# Patient Record
Sex: Female | Born: 1935 | Race: White | Hispanic: No | Marital: Married | Smoking: Never smoker
Health system: Southern US, Community
[De-identification: ages and names within clinical notes are randomized; demographics above are authoritative.]

## PROBLEM LIST (undated history)

## (undated) DIAGNOSIS — E669 Obesity, unspecified: Secondary | ICD-10-CM

## (undated) DIAGNOSIS — M810 Age-related osteoporosis without current pathological fracture: Secondary | ICD-10-CM

## (undated) DIAGNOSIS — C8290 Follicular lymphoma, unspecified, unspecified site: Secondary | ICD-10-CM

## (undated) DIAGNOSIS — I722 Aneurysm of renal artery: Secondary | ICD-10-CM

## (undated) DIAGNOSIS — Z87440 Personal history of urinary (tract) infections: Secondary | ICD-10-CM

## (undated) DIAGNOSIS — E039 Hypothyroidism, unspecified: Secondary | ICD-10-CM

## (undated) DIAGNOSIS — R600 Localized edema: Secondary | ICD-10-CM

## (undated) DIAGNOSIS — D329 Benign neoplasm of meninges, unspecified: Secondary | ICD-10-CM

## (undated) DIAGNOSIS — K219 Gastro-esophageal reflux disease without esophagitis: Secondary | ICD-10-CM

## (undated) DIAGNOSIS — I619 Nontraumatic intracerebral hemorrhage, unspecified: Secondary | ICD-10-CM

## (undated) DIAGNOSIS — I5032 Chronic diastolic (congestive) heart failure: Secondary | ICD-10-CM

## (undated) DIAGNOSIS — I10 Essential (primary) hypertension: Secondary | ICD-10-CM

## (undated) DIAGNOSIS — IMO0002 Reserved for concepts with insufficient information to code with codable children: Secondary | ICD-10-CM

## (undated) DIAGNOSIS — M797 Fibromyalgia: Secondary | ICD-10-CM

## (undated) HISTORY — DX: Obesity, unspecified: E66.9

## (undated) HISTORY — DX: Localized edema: R60.0

## (undated) HISTORY — PX: CATARACT EXTRACTION: SUR2

## (undated) HISTORY — DX: Reserved for concepts with insufficient information to code with codable children: IMO0002

## (undated) HISTORY — DX: Chronic diastolic (congestive) heart failure: I50.32

## (undated) HISTORY — DX: Personal history of urinary (tract) infections: Z87.440

## (undated) HISTORY — DX: Benign neoplasm of meninges, unspecified: D32.9

## (undated) HISTORY — DX: Gastro-esophageal reflux disease without esophagitis: K21.9

## (undated) HISTORY — DX: Aneurysm of renal artery: I72.2

## (undated) HISTORY — DX: Hypothyroidism, unspecified: E03.9

## (undated) HISTORY — DX: Age-related osteoporosis without current pathological fracture: M81.0

## (undated) HISTORY — DX: Essential (primary) hypertension: I10

## (undated) HISTORY — DX: Nontraumatic intracerebral hemorrhage, unspecified: I61.9

## (undated) HISTORY — DX: Fibromyalgia: M79.7

## (undated) HISTORY — DX: Follicular lymphoma, unspecified, unspecified site: C82.90

---
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
## 1980-05-06 HISTORY — PX: CYSTOCELE REPAIR: SHX163

## 1980-05-06 HISTORY — PX: RECTOCELE REPAIR: SHX761

## 1983-05-07 HISTORY — PX: CERVICAL FUSION: SHX112

## 1999-06-26 ENCOUNTER — Encounter: Admission: RE | Admit: 1999-06-26 | Discharge: 1999-09-24 | Payer: Self-pay | Admitting: *Deleted

## 2002-05-06 DIAGNOSIS — I619 Nontraumatic intracerebral hemorrhage, unspecified: Secondary | ICD-10-CM

## 2002-05-06 HISTORY — DX: Nontraumatic intracerebral hemorrhage, unspecified: I61.9

## 2002-10-05 HISTORY — PX: OTHER SURGICAL HISTORY: SHX169

## 2002-10-07 ENCOUNTER — Inpatient Hospital Stay (HOSPITAL_COMMUNITY): Admission: EM | Admit: 2002-10-07 | Discharge: 2002-10-12 | Payer: Self-pay | Admitting: Emergency Medicine

## 2002-10-07 ENCOUNTER — Encounter: Payer: Self-pay | Admitting: Emergency Medicine

## 2002-10-07 ENCOUNTER — Encounter: Payer: Self-pay | Admitting: Cardiology

## 2002-10-08 ENCOUNTER — Encounter: Payer: Self-pay | Admitting: Internal Medicine

## 2002-10-11 ENCOUNTER — Encounter: Payer: Self-pay | Admitting: Internal Medicine

## 2003-10-20 ENCOUNTER — Inpatient Hospital Stay (HOSPITAL_COMMUNITY): Admission: EM | Admit: 2003-10-20 | Discharge: 2003-10-21 | Payer: Self-pay | Admitting: Emergency Medicine

## 2003-10-21 ENCOUNTER — Encounter: Payer: Self-pay | Admitting: Cardiology

## 2004-02-06 ENCOUNTER — Ambulatory Visit: Payer: Self-pay | Admitting: Ophthalmology

## 2004-02-24 ENCOUNTER — Ambulatory Visit (HOSPITAL_COMMUNITY): Admission: RE | Admit: 2004-02-24 | Discharge: 2004-02-24 | Payer: Self-pay | Admitting: Vascular Surgery

## 2004-05-22 ENCOUNTER — Ambulatory Visit: Payer: Self-pay | Admitting: Internal Medicine

## 2004-06-25 ENCOUNTER — Ambulatory Visit: Payer: Self-pay | Admitting: Internal Medicine

## 2004-09-24 ENCOUNTER — Ambulatory Visit: Payer: Self-pay | Admitting: Internal Medicine

## 2004-10-23 ENCOUNTER — Ambulatory Visit: Payer: Self-pay | Admitting: Internal Medicine

## 2004-10-24 ENCOUNTER — Ambulatory Visit: Payer: Self-pay | Admitting: Otolaryngology

## 2004-10-25 ENCOUNTER — Other Ambulatory Visit: Payer: Self-pay

## 2004-10-25 ENCOUNTER — Ambulatory Visit: Payer: Self-pay | Admitting: Otolaryngology

## 2004-10-31 ENCOUNTER — Ambulatory Visit: Payer: Self-pay | Admitting: Otolaryngology

## 2004-10-31 ENCOUNTER — Encounter: Payer: Self-pay | Admitting: Internal Medicine

## 2004-11-03 DIAGNOSIS — C8299 Follicular lymphoma, unspecified, extranodal and solid organ sites: Secondary | ICD-10-CM | POA: Insufficient documentation

## 2004-11-20 ENCOUNTER — Ambulatory Visit: Payer: Self-pay | Admitting: Internal Medicine

## 2004-11-29 ENCOUNTER — Ambulatory Visit: Payer: Self-pay | Admitting: Internal Medicine

## 2004-12-04 ENCOUNTER — Ambulatory Visit: Payer: Self-pay | Admitting: Internal Medicine

## 2004-12-04 ENCOUNTER — Ambulatory Visit: Payer: Self-pay | Admitting: Oncology

## 2004-12-12 ENCOUNTER — Ambulatory Visit (HOSPITAL_COMMUNITY): Admission: RE | Admit: 2004-12-12 | Discharge: 2004-12-12 | Payer: Self-pay | Admitting: Oncology

## 2004-12-25 ENCOUNTER — Ambulatory Visit: Payer: Self-pay | Admitting: Internal Medicine

## 2005-04-08 ENCOUNTER — Ambulatory Visit: Payer: Self-pay | Admitting: Internal Medicine

## 2005-04-10 ENCOUNTER — Ambulatory Visit: Payer: Self-pay | Admitting: Oncology

## 2005-05-01 ENCOUNTER — Ambulatory Visit: Payer: Self-pay | Admitting: Internal Medicine

## 2005-06-25 ENCOUNTER — Ambulatory Visit: Payer: Self-pay | Admitting: Internal Medicine

## 2005-07-15 ENCOUNTER — Ambulatory Visit: Payer: Self-pay | Admitting: Oncology

## 2005-07-18 ENCOUNTER — Ambulatory Visit (HOSPITAL_COMMUNITY): Admission: RE | Admit: 2005-07-18 | Discharge: 2005-07-18 | Payer: Self-pay | Admitting: Oncology

## 2005-07-24 ENCOUNTER — Ambulatory Visit: Payer: Self-pay | Admitting: Internal Medicine

## 2005-09-17 ENCOUNTER — Ambulatory Visit: Payer: Self-pay | Admitting: Internal Medicine

## 2005-10-18 ENCOUNTER — Encounter: Payer: Self-pay | Admitting: Internal Medicine

## 2005-11-11 ENCOUNTER — Ambulatory Visit: Payer: Self-pay | Admitting: Oncology

## 2005-11-12 LAB — CBC WITH DIFFERENTIAL (CANCER CENTER ONLY)
BASO%: 0.6 % (ref 0.0–2.0)
HCT: 44 % (ref 34.8–46.6)
LYMPH%: 23.7 % (ref 14.0–48.0)
MCH: 28.8 pg (ref 26.0–34.0)
MCV: 89 fL (ref 81–101)
MONO%: 5.6 % (ref 0.0–13.0)
NEUT%: 67.4 % (ref 39.6–80.0)
Platelets: 281 10*3/uL (ref 145–400)
RDW: 13 % (ref 10.5–14.6)

## 2005-11-12 LAB — COMPREHENSIVE METABOLIC PANEL
AST: 17 U/L (ref 0–37)
Alkaline Phosphatase: 73 U/L (ref 39–117)
BUN: 24 mg/dL — ABNORMAL HIGH (ref 6–23)
Creatinine, Ser: 0.8 mg/dL (ref 0.40–1.20)

## 2005-11-14 ENCOUNTER — Ambulatory Visit (HOSPITAL_COMMUNITY): Admission: RE | Admit: 2005-11-14 | Discharge: 2005-11-14 | Payer: Self-pay | Admitting: Oncology

## 2005-12-24 ENCOUNTER — Ambulatory Visit: Payer: Self-pay | Admitting: Internal Medicine

## 2006-01-23 ENCOUNTER — Ambulatory Visit: Payer: Self-pay | Admitting: Internal Medicine

## 2006-02-14 ENCOUNTER — Ambulatory Visit: Payer: Self-pay | Admitting: Internal Medicine

## 2006-03-03 ENCOUNTER — Encounter: Admission: RE | Admit: 2006-03-03 | Discharge: 2006-03-03 | Payer: Self-pay | Admitting: Vascular Surgery

## 2006-03-03 ENCOUNTER — Encounter: Payer: Self-pay | Admitting: Internal Medicine

## 2006-03-24 ENCOUNTER — Ambulatory Visit: Payer: Self-pay | Admitting: Internal Medicine

## 2006-03-28 ENCOUNTER — Encounter: Admission: RE | Admit: 2006-03-28 | Discharge: 2006-03-28 | Payer: Self-pay | Admitting: Internal Medicine

## 2006-04-16 ENCOUNTER — Ambulatory Visit: Payer: Self-pay | Admitting: Internal Medicine

## 2006-05-20 ENCOUNTER — Ambulatory Visit: Payer: Self-pay | Admitting: Oncology

## 2006-05-23 LAB — CBC WITH DIFFERENTIAL (CANCER CENTER ONLY)
BASO%: 0.5 % (ref 0.0–2.0)
EOS%: 2.3 % (ref 0.0–7.0)
LYMPH#: 1.5 10*3/uL (ref 0.9–3.3)
LYMPH%: 23.5 % (ref 14.0–48.0)
MCHC: 33.6 g/dL (ref 32.0–36.0)
MCV: 88 fL (ref 81–101)
MONO#: 0.5 10*3/uL (ref 0.1–0.9)
Platelets: 225 10*3/uL (ref 145–400)
RDW: 13.2 % (ref 10.5–14.6)
WBC: 6.3 10*3/uL (ref 3.9–10.0)

## 2006-05-23 LAB — COMPREHENSIVE METABOLIC PANEL
AST: 15 U/L (ref 0–37)
Albumin: 4.3 g/dL (ref 3.5–5.2)
BUN: 27 mg/dL — ABNORMAL HIGH (ref 6–23)
Calcium: 10.3 mg/dL (ref 8.4–10.5)
Chloride: 101 mEq/L (ref 96–112)
Glucose, Bld: 104 mg/dL — ABNORMAL HIGH (ref 70–99)
Potassium: 4.3 mEq/L (ref 3.5–5.3)

## 2006-06-03 ENCOUNTER — Ambulatory Visit (HOSPITAL_COMMUNITY): Admission: RE | Admit: 2006-06-03 | Discharge: 2006-06-03 | Payer: Self-pay | Admitting: Oncology

## 2006-06-05 ENCOUNTER — Encounter: Payer: Self-pay | Admitting: Internal Medicine

## 2006-06-11 ENCOUNTER — Ambulatory Visit: Payer: Self-pay | Admitting: Internal Medicine

## 2006-06-17 ENCOUNTER — Emergency Department (HOSPITAL_COMMUNITY): Admission: EM | Admit: 2006-06-17 | Discharge: 2006-06-17 | Payer: Self-pay | Admitting: Emergency Medicine

## 2006-07-10 ENCOUNTER — Ambulatory Visit: Payer: Self-pay | Admitting: Internal Medicine

## 2006-07-21 ENCOUNTER — Ambulatory Visit: Payer: Self-pay | Admitting: Internal Medicine

## 2006-08-11 ENCOUNTER — Ambulatory Visit: Payer: Self-pay | Admitting: Internal Medicine

## 2006-08-11 LAB — CONVERTED CEMR LAB
Albumin: 3.7 g/dL (ref 3.5–5.2)
CO2: 34 meq/L — ABNORMAL HIGH (ref 19–32)
Calcium: 10.3 mg/dL (ref 8.4–10.5)
Chloride: 100 meq/L (ref 96–112)
Cholesterol: 198 mg/dL (ref 0–200)
Creatinine, Ser: 0.8 mg/dL (ref 0.4–1.2)
Creatinine,U: 64.3 mg/dL
Direct LDL: 134.3 mg/dL
GFR calc Af Amer: 89 mL/min
GFR calc non Af Amer: 73 mL/min
Glucose, Bld: 109 mg/dL — ABNORMAL HIGH (ref 70–99)
Microalb Creat Ratio: 7.8 mg/g (ref 0.0–30.0)
Microalb, Ur: 0.5 mg/dL (ref 0.0–1.9)
Potassium: 4 meq/L (ref 3.5–5.1)
Sodium: 140 meq/L (ref 135–145)
TSH: 0.95 microintl units/mL (ref 0.35–5.50)
VLDL: 43 mg/dL — ABNORMAL HIGH (ref 0–40)

## 2006-08-19 DIAGNOSIS — M5030 Other cervical disc degeneration, unspecified cervical region: Secondary | ICD-10-CM | POA: Insufficient documentation

## 2006-08-19 DIAGNOSIS — I10 Essential (primary) hypertension: Secondary | ICD-10-CM | POA: Insufficient documentation

## 2006-08-19 DIAGNOSIS — I872 Venous insufficiency (chronic) (peripheral): Secondary | ICD-10-CM | POA: Insufficient documentation

## 2006-08-19 DIAGNOSIS — IMO0001 Reserved for inherently not codable concepts without codable children: Secondary | ICD-10-CM

## 2006-08-19 DIAGNOSIS — E039 Hypothyroidism, unspecified: Secondary | ICD-10-CM

## 2006-08-19 DIAGNOSIS — M810 Age-related osteoporosis without current pathological fracture: Secondary | ICD-10-CM | POA: Insufficient documentation

## 2006-09-05 ENCOUNTER — Telehealth (INDEPENDENT_AMBULATORY_CARE_PROVIDER_SITE_OTHER): Payer: Self-pay | Admitting: *Deleted

## 2006-09-24 ENCOUNTER — Telehealth (INDEPENDENT_AMBULATORY_CARE_PROVIDER_SITE_OTHER): Payer: Self-pay | Admitting: *Deleted

## 2006-09-30 ENCOUNTER — Telehealth (INDEPENDENT_AMBULATORY_CARE_PROVIDER_SITE_OTHER): Payer: Self-pay | Admitting: *Deleted

## 2006-10-21 ENCOUNTER — Telehealth (INDEPENDENT_AMBULATORY_CARE_PROVIDER_SITE_OTHER): Payer: Self-pay | Admitting: *Deleted

## 2006-10-22 ENCOUNTER — Telehealth (INDEPENDENT_AMBULATORY_CARE_PROVIDER_SITE_OTHER): Payer: Self-pay | Admitting: *Deleted

## 2006-10-24 ENCOUNTER — Telehealth (INDEPENDENT_AMBULATORY_CARE_PROVIDER_SITE_OTHER): Payer: Self-pay | Admitting: *Deleted

## 2006-11-11 ENCOUNTER — Ambulatory Visit: Payer: Self-pay | Admitting: Internal Medicine

## 2006-11-14 ENCOUNTER — Telehealth (INDEPENDENT_AMBULATORY_CARE_PROVIDER_SITE_OTHER): Payer: Self-pay | Admitting: *Deleted

## 2006-11-24 ENCOUNTER — Ambulatory Visit: Payer: Self-pay | Admitting: Oncology

## 2006-11-24 ENCOUNTER — Encounter
Admission: RE | Admit: 2006-11-24 | Discharge: 2007-02-22 | Payer: Self-pay | Admitting: Physical Medicine & Rehabilitation

## 2006-11-25 ENCOUNTER — Ambulatory Visit: Payer: Self-pay | Admitting: Physical Medicine & Rehabilitation

## 2006-11-25 LAB — CBC WITH DIFFERENTIAL (CANCER CENTER ONLY)
BASO#: 0 10e3/uL (ref 0.0–0.2)
BASO%: 0.3 % (ref 0.0–2.0)
EOS%: 2.9 % (ref 0.0–7.0)
Eosinophils Absolute: 0.2 10e3/uL (ref 0.0–0.5)
HCT: 38.7 % (ref 34.8–46.6)
HGB: 13.3 g/dL (ref 11.6–15.9)
LYMPH#: 1.3 10e3/uL (ref 0.9–3.3)
LYMPH%: 21.4 % (ref 14.0–48.0)
MCH: 28.8 pg (ref 26.0–34.0)
MCHC: 34.3 g/dL (ref 32.0–36.0)
MCV: 84 fL (ref 81–101)
MONO#: 0.4 10e3/uL (ref 0.1–0.9)
MONO%: 7 % (ref 0.0–13.0)
NEUT#: 4.2 10e3/uL (ref 1.5–6.5)
NEUT%: 68.4 % (ref 39.6–80.0)
Platelets: 256 10e3/uL (ref 145–400)
RBC: 4.61 10e6/uL (ref 3.70–5.32)
RDW: 12.5 % (ref 10.5–14.6)
WBC: 6.1 10e3/uL (ref 3.9–10.0)

## 2006-11-25 LAB — COMPREHENSIVE METABOLIC PANEL
AST: 17 U/L (ref 0–37)
Alkaline Phosphatase: 71 U/L (ref 39–117)
BUN: 23 mg/dL (ref 6–23)
Calcium: 10.1 mg/dL (ref 8.4–10.5)
Chloride: 103 mEq/L (ref 96–112)
Creatinine, Ser: 0.98 mg/dL (ref 0.40–1.20)

## 2006-11-27 ENCOUNTER — Ambulatory Visit (HOSPITAL_COMMUNITY): Admission: RE | Admit: 2006-11-27 | Discharge: 2006-11-27 | Payer: Self-pay | Admitting: Oncology

## 2006-12-02 ENCOUNTER — Telehealth (INDEPENDENT_AMBULATORY_CARE_PROVIDER_SITE_OTHER): Payer: Self-pay | Admitting: *Deleted

## 2006-12-04 ENCOUNTER — Encounter: Payer: Self-pay | Admitting: Internal Medicine

## 2006-12-09 ENCOUNTER — Ambulatory Visit (HOSPITAL_COMMUNITY): Admission: RE | Admit: 2006-12-09 | Discharge: 2006-12-09 | Payer: Self-pay | Admitting: Oncology

## 2006-12-22 ENCOUNTER — Encounter: Payer: Self-pay | Admitting: Internal Medicine

## 2007-01-14 ENCOUNTER — Telehealth (INDEPENDENT_AMBULATORY_CARE_PROVIDER_SITE_OTHER): Payer: Self-pay | Admitting: *Deleted

## 2007-01-26 ENCOUNTER — Ambulatory Visit: Payer: Self-pay | Admitting: Internal Medicine

## 2007-01-28 ENCOUNTER — Ambulatory Visit: Payer: Self-pay

## 2007-01-30 ENCOUNTER — Encounter: Payer: Self-pay | Admitting: Internal Medicine

## 2007-03-16 ENCOUNTER — Telehealth (INDEPENDENT_AMBULATORY_CARE_PROVIDER_SITE_OTHER): Payer: Self-pay | Admitting: *Deleted

## 2007-04-06 ENCOUNTER — Telehealth (INDEPENDENT_AMBULATORY_CARE_PROVIDER_SITE_OTHER): Payer: Self-pay | Admitting: *Deleted

## 2007-04-10 ENCOUNTER — Ambulatory Visit: Payer: Self-pay | Admitting: Internal Medicine

## 2007-04-10 DIAGNOSIS — M549 Dorsalgia, unspecified: Secondary | ICD-10-CM | POA: Insufficient documentation

## 2007-04-23 ENCOUNTER — Ambulatory Visit: Payer: Self-pay | Admitting: Internal Medicine

## 2007-04-23 DIAGNOSIS — N3000 Acute cystitis without hematuria: Secondary | ICD-10-CM | POA: Insufficient documentation

## 2007-04-23 LAB — CONVERTED CEMR LAB
Glucose, Urine, Semiquant: NEGATIVE
Nitrite: NEGATIVE
Specific Gravity, Urine: <1.005
pH: 7

## 2007-04-29 ENCOUNTER — Telehealth (INDEPENDENT_AMBULATORY_CARE_PROVIDER_SITE_OTHER): Payer: Self-pay | Admitting: *Deleted

## 2007-05-04 ENCOUNTER — Telehealth: Payer: Self-pay | Admitting: Family Medicine

## 2007-06-12 ENCOUNTER — Ambulatory Visit: Payer: Self-pay | Admitting: Oncology

## 2007-06-15 LAB — CBC WITH DIFFERENTIAL (CANCER CENTER ONLY)
BASO#: 0 10*3/uL (ref 0.0–0.2)
Eosinophils Absolute: 0.2 10*3/uL (ref 0.0–0.5)
HCT: 39.7 % (ref 34.8–46.6)
HGB: 13.6 g/dL (ref 11.6–15.9)
LYMPH%: 22.6 % (ref 14.0–48.0)
MCH: 28.8 pg (ref 26.0–34.0)
MCV: 84 fL (ref 81–101)
MONO%: 5.1 % (ref 0.0–13.0)
NEUT%: 68.8 % (ref 39.6–80.0)
Platelets: 226 10*3/uL (ref 145–400)
RBC: 4.72 10*6/uL (ref 3.70–5.32)

## 2007-06-15 LAB — COMPREHENSIVE METABOLIC PANEL
ALT: 19 U/L (ref 0–35)
AST: 15 U/L (ref 0–37)
Albumin: 4.2 g/dL (ref 3.5–5.2)
Calcium: 10.5 mg/dL (ref 8.4–10.5)
Chloride: 101 mEq/L (ref 96–112)
Potassium: 4.2 mEq/L (ref 3.5–5.3)

## 2007-06-17 ENCOUNTER — Ambulatory Visit (HOSPITAL_COMMUNITY): Admission: RE | Admit: 2007-06-17 | Discharge: 2007-06-17 | Payer: Self-pay | Admitting: Oncology

## 2007-06-22 ENCOUNTER — Telehealth (INDEPENDENT_AMBULATORY_CARE_PROVIDER_SITE_OTHER): Payer: Self-pay | Admitting: *Deleted

## 2007-07-16 ENCOUNTER — Ambulatory Visit: Payer: Self-pay | Admitting: Internal Medicine

## 2007-07-17 ENCOUNTER — Encounter: Payer: Self-pay | Admitting: Internal Medicine

## 2007-07-17 LAB — CONVERTED CEMR LAB
Basophils Absolute: 0 10*3/uL (ref 0.0–0.1)
Basophils Relative: 0.3 % (ref 0.0–1.0)
Calcium: 10.3 mg/dL (ref 8.4–10.5)
Eosinophils Relative: 2.7 % (ref 0.0–5.0)
GFR calc Af Amer: 89 mL/min
MCV: 86.4 fL (ref 78.0–100.0)
Platelets: 232 10*3/uL (ref 150–400)
Sodium: 139 meq/L (ref 135–145)
TSH: 1.95 microintl units/mL (ref 0.35–5.50)

## 2007-08-12 ENCOUNTER — Telehealth (INDEPENDENT_AMBULATORY_CARE_PROVIDER_SITE_OTHER): Payer: Self-pay | Admitting: *Deleted

## 2007-08-18 ENCOUNTER — Telehealth: Payer: Self-pay | Admitting: Family Medicine

## 2007-09-21 ENCOUNTER — Telehealth (INDEPENDENT_AMBULATORY_CARE_PROVIDER_SITE_OTHER): Payer: Self-pay | Admitting: *Deleted

## 2007-09-29 ENCOUNTER — Telehealth (INDEPENDENT_AMBULATORY_CARE_PROVIDER_SITE_OTHER): Payer: Self-pay | Admitting: *Deleted

## 2007-09-30 ENCOUNTER — Telehealth (INDEPENDENT_AMBULATORY_CARE_PROVIDER_SITE_OTHER): Payer: Self-pay | Admitting: *Deleted

## 2007-10-12 ENCOUNTER — Telehealth: Payer: Self-pay | Admitting: Internal Medicine

## 2007-10-30 ENCOUNTER — Telehealth (INDEPENDENT_AMBULATORY_CARE_PROVIDER_SITE_OTHER): Payer: Self-pay | Admitting: *Deleted

## 2007-11-09 ENCOUNTER — Telehealth (INDEPENDENT_AMBULATORY_CARE_PROVIDER_SITE_OTHER): Payer: Self-pay | Admitting: *Deleted

## 2007-11-20 ENCOUNTER — Ambulatory Visit: Payer: Self-pay | Admitting: Internal Medicine

## 2007-11-23 LAB — CONVERTED CEMR LAB
BUN: 17 mg/dL (ref 6–23)
Creatinine, Ser: 0.9 mg/dL (ref 0.4–1.2)
GFR calc Af Amer: 77 mL/min
GFR calc non Af Amer: 64 mL/min
Phosphorus: 3.7 mg/dL (ref 2.3–4.6)
Potassium: 4 meq/L (ref 3.5–5.1)

## 2007-11-30 ENCOUNTER — Telehealth (INDEPENDENT_AMBULATORY_CARE_PROVIDER_SITE_OTHER): Payer: Self-pay | Admitting: *Deleted

## 2007-12-03 ENCOUNTER — Ambulatory Visit: Payer: Self-pay | Admitting: Oncology

## 2007-12-08 LAB — CBC WITH DIFFERENTIAL (CANCER CENTER ONLY)
BASO#: 0 10*3/uL (ref 0.0–0.2)
Eosinophils Absolute: 0.2 10*3/uL (ref 0.0–0.5)
HGB: 13.7 g/dL (ref 11.6–15.9)
MCH: 27.7 pg (ref 26.0–34.0)
MONO#: 0.3 10*3/uL (ref 0.1–0.9)
MONO%: 5.9 % (ref 0.0–13.0)
NEUT#: 3.6 10*3/uL (ref 1.5–6.5)
RBC: 4.94 10*6/uL (ref 3.70–5.32)

## 2007-12-08 LAB — COMPREHENSIVE METABOLIC PANEL
Albumin: 4.1 g/dL (ref 3.5–5.2)
Alkaline Phosphatase: 70 U/L (ref 39–117)
CO2: 28 mEq/L (ref 19–32)
Calcium: 10.3 mg/dL (ref 8.4–10.5)
Creatinine, Ser: 1.02 mg/dL (ref 0.40–1.20)
Sodium: 140 mEq/L (ref 135–145)

## 2007-12-10 ENCOUNTER — Encounter (INDEPENDENT_AMBULATORY_CARE_PROVIDER_SITE_OTHER): Payer: Self-pay | Admitting: *Deleted

## 2007-12-10 ENCOUNTER — Ambulatory Visit (HOSPITAL_COMMUNITY): Admission: RE | Admit: 2007-12-10 | Discharge: 2007-12-10 | Payer: Self-pay | Admitting: Oncology

## 2007-12-14 ENCOUNTER — Telehealth (INDEPENDENT_AMBULATORY_CARE_PROVIDER_SITE_OTHER): Payer: Self-pay | Admitting: *Deleted

## 2007-12-14 ENCOUNTER — Encounter: Payer: Self-pay | Admitting: Internal Medicine

## 2007-12-28 ENCOUNTER — Telehealth (INDEPENDENT_AMBULATORY_CARE_PROVIDER_SITE_OTHER): Payer: Self-pay | Admitting: *Deleted

## 2008-01-25 ENCOUNTER — Telehealth: Payer: Self-pay | Admitting: Internal Medicine

## 2008-02-11 ENCOUNTER — Telehealth: Payer: Self-pay | Admitting: Internal Medicine

## 2008-02-18 ENCOUNTER — Ambulatory Visit: Payer: Self-pay | Admitting: Family Medicine

## 2008-02-22 ENCOUNTER — Ambulatory Visit: Payer: Self-pay | Admitting: Internal Medicine

## 2008-02-27 ENCOUNTER — Telehealth (INDEPENDENT_AMBULATORY_CARE_PROVIDER_SITE_OTHER): Payer: Self-pay | Admitting: *Deleted

## 2008-02-29 ENCOUNTER — Telehealth: Payer: Self-pay | Admitting: Internal Medicine

## 2008-03-14 ENCOUNTER — Telehealth (INDEPENDENT_AMBULATORY_CARE_PROVIDER_SITE_OTHER): Payer: Self-pay | Admitting: *Deleted

## 2008-03-21 ENCOUNTER — Telehealth (INDEPENDENT_AMBULATORY_CARE_PROVIDER_SITE_OTHER): Payer: Self-pay | Admitting: *Deleted

## 2008-03-28 ENCOUNTER — Telehealth: Payer: Self-pay | Admitting: Internal Medicine

## 2008-04-20 ENCOUNTER — Telehealth: Payer: Self-pay | Admitting: Internal Medicine

## 2008-05-10 ENCOUNTER — Telehealth: Payer: Self-pay | Admitting: Family Medicine

## 2008-05-16 ENCOUNTER — Telehealth: Payer: Self-pay | Admitting: Internal Medicine

## 2008-05-23 ENCOUNTER — Telehealth: Payer: Self-pay | Admitting: Internal Medicine

## 2008-05-30 ENCOUNTER — Telehealth: Payer: Self-pay | Admitting: Internal Medicine

## 2008-05-31 ENCOUNTER — Ambulatory Visit: Payer: Self-pay | Admitting: Internal Medicine

## 2008-06-01 LAB — CONVERTED CEMR LAB
ALT: 18 units/L (ref 0–35)
AST: 16 units/L (ref 0–37)
Basophils Absolute: 0.1 10*3/uL (ref 0.0–0.1)
Basophils Relative: 1.4 % (ref 0.0–3.0)
Creatinine, Ser: 0.8 mg/dL (ref 0.4–1.2)
Eosinophils Absolute: 0.2 10*3/uL (ref 0.0–0.7)
Eosinophils Relative: 2.8 % (ref 0.0–5.0)
Free T4: 0.8 ng/dL (ref 0.6–1.6)
GFR calc non Af Amer: 73 mL/min
HCT: 40.3 % (ref 36.0–46.0)
Hemoglobin: 13.5 g/dL (ref 12.0–15.0)
Lymphocytes Relative: 19.9 % (ref 12.0–46.0)
Monocytes Relative: 7.2 % (ref 3.0–12.0)
Phosphorus: 3.6 mg/dL (ref 2.3–4.6)
Platelets: 230 10*3/uL (ref 150–400)
Potassium: 3.6 meq/L (ref 3.5–5.1)
RDW: 13.2 % (ref 11.5–14.6)
Sodium: 140 meq/L (ref 135–145)
Total Bilirubin: 0.6 mg/dL (ref 0.3–1.2)
Total Protein: 7.6 g/dL (ref 6.0–8.3)

## 2008-06-22 ENCOUNTER — Telehealth: Payer: Self-pay | Admitting: Internal Medicine

## 2008-07-05 ENCOUNTER — Ambulatory Visit: Payer: Self-pay | Admitting: Internal Medicine

## 2008-07-11 ENCOUNTER — Telehealth: Payer: Self-pay | Admitting: Internal Medicine

## 2008-07-19 ENCOUNTER — Telehealth: Payer: Self-pay | Admitting: Internal Medicine

## 2008-07-26 ENCOUNTER — Ambulatory Visit: Payer: Self-pay | Admitting: Internal Medicine

## 2008-07-26 DIAGNOSIS — I8000 Phlebitis and thrombophlebitis of superficial vessels of unspecified lower extremity: Secondary | ICD-10-CM | POA: Insufficient documentation

## 2008-08-17 ENCOUNTER — Ambulatory Visit: Payer: Self-pay | Admitting: Family Medicine

## 2008-08-22 ENCOUNTER — Telehealth: Payer: Self-pay | Admitting: Internal Medicine

## 2008-08-29 ENCOUNTER — Telehealth: Payer: Self-pay | Admitting: Internal Medicine

## 2008-09-02 ENCOUNTER — Ambulatory Visit: Payer: Self-pay | Admitting: Family Medicine

## 2008-09-02 LAB — CONVERTED CEMR LAB
Bilirubin Urine: NEGATIVE
Blood in Urine, dipstick: NEGATIVE
Nitrite: NEGATIVE
Urobilinogen, UA: 0.2
pH: 6.5

## 2008-09-05 ENCOUNTER — Telehealth: Payer: Self-pay | Admitting: Family Medicine

## 2008-09-05 LAB — CONVERTED CEMR LAB
AST: 23 units/L (ref 0–37)
Alkaline Phosphatase: 64 units/L (ref 39–117)
Basophils Absolute: 0 10*3/uL (ref 0.0–0.1)
Basophils Relative: 0.7 % (ref 0.0–3.0)
Bilirubin, Direct: 0 mg/dL (ref 0.0–0.3)
CO2: 33 meq/L — ABNORMAL HIGH (ref 19–32)
Creatinine, Ser: 0.7 mg/dL (ref 0.4–1.2)
HCT: 41 % (ref 36.0–46.0)
Hemoglobin: 14.1 g/dL (ref 12.0–15.0)
MCHC: 34.4 g/dL (ref 30.0–36.0)
Monocytes Absolute: 0.6 10*3/uL (ref 0.1–1.0)
Neutrophils Relative %: 59.9 % (ref 43.0–77.0)
Potassium: 3.5 meq/L (ref 3.5–5.1)
RBC: 4.6 M/uL (ref 3.87–5.11)
RDW: 13.6 % (ref 11.5–14.6)
TSH: 1.79 microintl units/mL (ref 0.35–5.50)
Total Protein: 8 g/dL (ref 6.0–8.3)

## 2008-09-08 ENCOUNTER — Telehealth: Payer: Self-pay | Admitting: Internal Medicine

## 2008-09-19 ENCOUNTER — Ambulatory Visit: Payer: Self-pay | Admitting: Internal Medicine

## 2008-09-20 LAB — CONVERTED CEMR LAB
AST: 23 units/L (ref 0–37)
Basophils Absolute: 0 10*3/uL (ref 0.0–0.1)
Basophils Relative: 0.5 % (ref 0.0–3.0)
Calcium: 10 mg/dL (ref 8.4–10.5)
Creatinine, Ser: 0.7 mg/dL (ref 0.4–1.2)
Eosinophils Relative: 1.5 % (ref 0.0–5.0)
Free T4: 0.6 ng/dL (ref 0.6–1.6)
HCT: 38.2 % (ref 36.0–46.0)
Hgb A1c MFr Bld: 6.2 % (ref 4.6–6.5)
Lymphs Abs: 1.4 10*3/uL (ref 0.7–4.0)
MCHC: 34.7 g/dL (ref 30.0–36.0)
Monocytes Absolute: 0.5 10*3/uL (ref 0.1–1.0)
Neutro Abs: 4.2 10*3/uL (ref 1.4–7.7)
Phosphorus: 3.2 mg/dL (ref 2.3–4.6)
Platelets: 218 10*3/uL (ref 150.0–400.0)
TSH: 1.71 microintl units/mL (ref 0.35–5.50)
Total Bilirubin: 0.7 mg/dL (ref 0.3–1.2)
Total Protein: 6.9 g/dL (ref 6.0–8.3)

## 2008-09-23 ENCOUNTER — Encounter: Payer: Self-pay | Admitting: Internal Medicine

## 2008-09-30 ENCOUNTER — Encounter: Payer: Self-pay | Admitting: Internal Medicine

## 2008-10-04 ENCOUNTER — Telehealth: Payer: Self-pay | Admitting: Internal Medicine

## 2008-10-07 ENCOUNTER — Telehealth: Payer: Self-pay | Admitting: Internal Medicine

## 2008-10-21 ENCOUNTER — Telehealth: Payer: Self-pay | Admitting: Internal Medicine

## 2008-10-27 ENCOUNTER — Telehealth: Payer: Self-pay | Admitting: Internal Medicine

## 2008-11-09 ENCOUNTER — Telehealth: Payer: Self-pay | Admitting: Internal Medicine

## 2008-11-16 ENCOUNTER — Encounter: Payer: Self-pay | Admitting: Internal Medicine

## 2008-11-18 ENCOUNTER — Telehealth: Payer: Self-pay | Admitting: Internal Medicine

## 2008-11-30 ENCOUNTER — Ambulatory Visit: Payer: Self-pay | Admitting: Internal Medicine

## 2008-12-01 ENCOUNTER — Telehealth: Payer: Self-pay | Admitting: Internal Medicine

## 2008-12-12 ENCOUNTER — Telehealth: Payer: Self-pay | Admitting: Internal Medicine

## 2008-12-15 ENCOUNTER — Telehealth: Payer: Self-pay | Admitting: Internal Medicine

## 2008-12-20 ENCOUNTER — Ambulatory Visit: Payer: Self-pay | Admitting: Family Medicine

## 2008-12-22 ENCOUNTER — Ambulatory Visit: Payer: Self-pay | Admitting: Gastroenterology

## 2008-12-26 ENCOUNTER — Telehealth: Payer: Self-pay | Admitting: Internal Medicine

## 2009-01-10 ENCOUNTER — Encounter: Payer: Self-pay | Admitting: Gastroenterology

## 2009-01-17 ENCOUNTER — Encounter: Payer: Self-pay | Admitting: Gastroenterology

## 2009-01-17 ENCOUNTER — Ambulatory Visit: Payer: Self-pay | Admitting: Gastroenterology

## 2009-01-17 DIAGNOSIS — K210 Gastro-esophageal reflux disease with esophagitis: Secondary | ICD-10-CM

## 2009-01-19 ENCOUNTER — Encounter: Payer: Self-pay | Admitting: Gastroenterology

## 2009-01-20 ENCOUNTER — Ambulatory Visit: Payer: Self-pay | Admitting: Internal Medicine

## 2009-01-24 ENCOUNTER — Ambulatory Visit: Payer: Self-pay | Admitting: Family Medicine

## 2009-01-24 LAB — CONVERTED CEMR LAB
Ketones, urine, test strip: NEGATIVE
Nitrite: NEGATIVE

## 2009-02-01 ENCOUNTER — Telehealth: Payer: Self-pay | Admitting: Internal Medicine

## 2009-02-06 ENCOUNTER — Telehealth: Payer: Self-pay | Admitting: Internal Medicine

## 2009-02-22 ENCOUNTER — Ambulatory Visit: Payer: Self-pay | Admitting: Gastroenterology

## 2009-02-25 ENCOUNTER — Telehealth: Payer: Self-pay | Admitting: Internal Medicine

## 2009-02-27 ENCOUNTER — Ambulatory Visit: Payer: Self-pay | Admitting: Family Medicine

## 2009-02-27 ENCOUNTER — Telehealth: Payer: Self-pay | Admitting: Gastroenterology

## 2009-02-27 DIAGNOSIS — R32 Unspecified urinary incontinence: Secondary | ICD-10-CM | POA: Insufficient documentation

## 2009-02-27 LAB — CONVERTED CEMR LAB
Blood in Urine, dipstick: NEGATIVE
Protein, U semiquant: NEGATIVE
WBC Urine, dipstick: NEGATIVE
pH: 6

## 2009-03-06 ENCOUNTER — Telehealth: Payer: Self-pay | Admitting: Internal Medicine

## 2009-03-07 ENCOUNTER — Telehealth: Payer: Self-pay | Admitting: Internal Medicine

## 2009-03-08 ENCOUNTER — Ambulatory Visit: Payer: Self-pay | Admitting: Family Medicine

## 2009-03-15 ENCOUNTER — Encounter: Payer: Self-pay | Admitting: Family Medicine

## 2009-03-15 LAB — CONVERTED CEMR LAB
Eosinophils Relative: 1 % (ref 0–5)
HCT: 34 % — ABNORMAL LOW (ref 36.0–46.0)
Hepatitis B Surface Ag: NEGATIVE
Hgb A2 Quant: 2.2 % (ref 2.2–3.2)
Hgb A: 97.8 % (ref 96.8–97.8)
Hgb F Quant: 0 % (ref 0.0–2.0)
Lymphs Abs: 1.1 10*3/uL (ref 0.7–4.0)
Monocytes Relative: 8 % (ref 3–12)
Neutro Abs: 7.1 10*3/uL (ref 1.7–7.7)
Neutrophils Relative %: 79 % — ABNORMAL HIGH (ref 43–77)
Platelets: 336 10*3/uL (ref 150–400)
RBC: 4.08 M/uL (ref 3.87–5.11)

## 2009-03-17 ENCOUNTER — Encounter: Payer: Self-pay | Admitting: Family Medicine

## 2009-03-21 ENCOUNTER — Ambulatory Visit: Payer: Self-pay | Admitting: Family Medicine

## 2009-03-22 ENCOUNTER — Encounter: Payer: Self-pay | Admitting: Family Medicine

## 2009-03-29 ENCOUNTER — Encounter: Payer: Self-pay | Admitting: Internal Medicine

## 2009-04-05 ENCOUNTER — Ambulatory Visit: Payer: Self-pay | Admitting: Urology

## 2009-04-10 ENCOUNTER — Telehealth: Payer: Self-pay | Admitting: Internal Medicine

## 2009-04-24 ENCOUNTER — Telehealth: Payer: Self-pay | Admitting: Internal Medicine

## 2009-05-16 ENCOUNTER — Telehealth: Payer: Self-pay | Admitting: Internal Medicine

## 2009-05-24 ENCOUNTER — Ambulatory Visit: Payer: Self-pay | Admitting: Internal Medicine

## 2009-05-25 ENCOUNTER — Telehealth: Payer: Self-pay | Admitting: Internal Medicine

## 2009-05-26 LAB — CONVERTED CEMR LAB
ALT: 17 units/L (ref 0–35)
AST: 18 units/L (ref 0–37)
Alkaline Phosphatase: 60 units/L (ref 39–117)
Bilirubin, Direct: 0 mg/dL (ref 0.0–0.3)
CO2: 33 meq/L — ABNORMAL HIGH (ref 19–32)
Calcium: 10.3 mg/dL (ref 8.4–10.5)
Creatinine, Ser: 0.7 mg/dL (ref 0.4–1.2)
Glucose, Bld: 119 mg/dL — ABNORMAL HIGH (ref 70–99)
Phosphorus: 3 mg/dL (ref 2.3–4.6)

## 2009-06-02 ENCOUNTER — Telehealth: Payer: Self-pay | Admitting: Internal Medicine

## 2009-06-07 ENCOUNTER — Telehealth: Payer: Self-pay | Admitting: Internal Medicine

## 2009-06-14 ENCOUNTER — Encounter: Payer: Self-pay | Admitting: Internal Medicine

## 2009-06-26 ENCOUNTER — Telehealth: Payer: Self-pay | Admitting: Internal Medicine

## 2009-07-19 ENCOUNTER — Telehealth: Payer: Self-pay | Admitting: Internal Medicine

## 2009-07-31 ENCOUNTER — Telehealth: Payer: Self-pay | Admitting: Internal Medicine

## 2009-08-22 ENCOUNTER — Telehealth: Payer: Self-pay | Admitting: Internal Medicine

## 2009-09-04 ENCOUNTER — Telehealth: Payer: Self-pay | Admitting: Internal Medicine

## 2009-09-19 ENCOUNTER — Ambulatory Visit: Payer: Self-pay | Admitting: Internal Medicine

## 2009-09-25 ENCOUNTER — Telehealth: Payer: Self-pay | Admitting: Internal Medicine

## 2009-09-25 ENCOUNTER — Encounter: Payer: Self-pay | Admitting: Internal Medicine

## 2009-10-09 ENCOUNTER — Telehealth: Payer: Self-pay | Admitting: Internal Medicine

## 2009-10-26 ENCOUNTER — Telehealth: Payer: Self-pay | Admitting: Internal Medicine

## 2009-10-27 ENCOUNTER — Ambulatory Visit: Payer: Self-pay | Admitting: Internal Medicine

## 2009-10-27 LAB — CONVERTED CEMR LAB
Bilirubin Urine: NEGATIVE
Glucose, Urine, Semiquant: NEGATIVE

## 2009-11-09 ENCOUNTER — Telehealth: Payer: Self-pay | Admitting: Internal Medicine

## 2009-11-22 ENCOUNTER — Telehealth: Payer: Self-pay | Admitting: Internal Medicine

## 2009-12-12 ENCOUNTER — Telehealth: Payer: Self-pay | Admitting: Internal Medicine

## 2009-12-15 ENCOUNTER — Telehealth: Payer: Self-pay | Admitting: Internal Medicine

## 2009-12-20 ENCOUNTER — Telehealth: Payer: Self-pay | Admitting: Internal Medicine

## 2010-01-01 ENCOUNTER — Ambulatory Visit: Payer: Self-pay | Admitting: Cardiology

## 2010-01-01 ENCOUNTER — Encounter: Payer: Self-pay | Admitting: Cardiovascular Disease

## 2010-01-09 ENCOUNTER — Encounter: Payer: Self-pay | Admitting: Cardiology

## 2010-01-09 ENCOUNTER — Ambulatory Visit: Payer: Self-pay

## 2010-01-11 ENCOUNTER — Ambulatory Visit: Payer: Self-pay | Admitting: Internal Medicine

## 2010-01-15 ENCOUNTER — Ambulatory Visit: Payer: Self-pay | Admitting: Cardiology

## 2010-01-15 LAB — CONVERTED CEMR LAB
ALT: 15 units/L (ref 0–35)
AST: 18 units/L (ref 0–37)
Basophils Absolute: 0 10*3/uL (ref 0.0–0.1)
Chloride: 104 meq/L (ref 96–112)
Creatinine, Ser: 0.8 mg/dL (ref 0.4–1.2)
Eosinophils Absolute: 0.1 10*3/uL (ref 0.0–0.7)
Eosinophils Relative: 1.9 % (ref 0.0–5.0)
Free T4: 0.76 ng/dL (ref 0.60–1.60)
Glucose, Bld: 95 mg/dL (ref 70–99)
HCT: 39.5 % (ref 36.0–46.0)
Hemoglobin: 13.2 g/dL (ref 12.0–15.0)
Monocytes Absolute: 0.5 10*3/uL (ref 0.1–1.0)
Neutro Abs: 4.2 10*3/uL (ref 1.4–7.7)
RDW: 14 % (ref 11.5–14.6)

## 2010-01-22 ENCOUNTER — Telehealth: Payer: Self-pay | Admitting: Internal Medicine

## 2010-01-23 LAB — CONVERTED CEMR LAB
ALT: 12 units/L (ref 0–35)
Albumin: 4.1 g/dL (ref 3.5–5.2)
Alkaline Phosphatase: 57 units/L (ref 39–117)
CO2: 28 meq/L (ref 19–32)
Cholesterol: 210 mg/dL — ABNORMAL HIGH (ref 0–200)
Creatinine, Ser: 0.77 mg/dL (ref 0.40–1.20)
HDL: 53 mg/dL (ref >39)
Indirect Bilirubin: 0.4 mg/dL (ref 0.0–0.9)
LDL Cholesterol: 133 mg/dL — ABNORMAL HIGH (ref 0–99)
Sodium: 143 meq/L (ref 135–145)
Total Bilirubin: 0.5 mg/dL (ref 0.3–1.2)
Total CHOL/HDL Ratio: 4
Triglycerides: 121 mg/dL (ref <150)
VLDL: 24 mg/dL (ref 0–40)

## 2010-01-29 ENCOUNTER — Ambulatory Visit: Payer: Self-pay | Admitting: Cardiology

## 2010-02-12 ENCOUNTER — Ambulatory Visit: Payer: Self-pay | Admitting: Cardiology

## 2010-02-12 ENCOUNTER — Telehealth: Payer: Self-pay | Admitting: Cardiology

## 2010-02-14 LAB — CONVERTED CEMR LAB
BUN: 20 mg/dL (ref 6–23)
Calcium: 10.9 mg/dL — ABNORMAL HIGH (ref 8.4–10.5)
Glucose, Bld: 111 mg/dL — ABNORMAL HIGH (ref 70–99)
Potassium: 4.1 meq/L (ref 3.5–5.3)

## 2010-02-20 ENCOUNTER — Telehealth: Payer: Self-pay | Admitting: Internal Medicine

## 2010-02-26 ENCOUNTER — Ambulatory Visit: Payer: Self-pay | Admitting: Cardiology

## 2010-03-12 ENCOUNTER — Telehealth: Payer: Self-pay | Admitting: Cardiology

## 2010-03-19 ENCOUNTER — Ambulatory Visit: Payer: Self-pay | Admitting: Internal Medicine

## 2010-03-19 LAB — CONVERTED CEMR LAB
Glucose, Urine, Semiquant: NEGATIVE
Ketones, urine, test strip: NEGATIVE
Protein, U semiquant: NEGATIVE
Specific Gravity, Urine: <1.005

## 2010-03-22 ENCOUNTER — Telehealth: Payer: Self-pay | Admitting: Cardiology

## 2010-03-26 ENCOUNTER — Telehealth: Payer: Self-pay | Admitting: Internal Medicine

## 2010-04-16 ENCOUNTER — Telehealth: Payer: Self-pay | Admitting: Cardiology

## 2010-04-16 ENCOUNTER — Telehealth: Payer: Self-pay | Admitting: Internal Medicine

## 2010-04-16 ENCOUNTER — Ambulatory Visit: Payer: Self-pay | Admitting: Internal Medicine

## 2010-04-16 LAB — CONVERTED CEMR LAB
Bilirubin Urine: NEGATIVE
Glucose, Urine, Semiquant: NEGATIVE
Specific Gravity, Urine: 1.015
pH: 6

## 2010-04-20 ENCOUNTER — Telehealth: Payer: Self-pay | Admitting: Family Medicine

## 2010-05-01 ENCOUNTER — Telehealth: Payer: Self-pay | Admitting: Internal Medicine

## 2010-05-10 ENCOUNTER — Telehealth: Payer: Self-pay | Admitting: Internal Medicine

## 2010-05-14 ENCOUNTER — Ambulatory Visit
Admission: RE | Admit: 2010-05-14 | Discharge: 2010-05-14 | Payer: Self-pay | Source: Home / Self Care | Attending: Internal Medicine | Admitting: Internal Medicine

## 2010-05-14 DIAGNOSIS — I5032 Chronic diastolic (congestive) heart failure: Secondary | ICD-10-CM | POA: Insufficient documentation

## 2010-05-14 LAB — CONVERTED CEMR LAB
Glucose, Urine, Semiquant: NEGATIVE
Nitrite: NEGATIVE
Protein, U semiquant: NEGATIVE
Specific Gravity, Urine: 1.01

## 2010-05-14 IMAGING — CT CT NECK W/ CM
3 of 10 series · 10 of 33 positions shown, 11 images · IV contrast (agent unspecified)
Comparison: 06/17/2007
COMPARISON: 06/17/2007

CT NECK

CLINICAL DATA: Follicular non-Hodgkins lymphoma.  Sinusitis.

CT LIMITED SINUSES WITHOUT CONTRAST
TECHNIQUE: Multidetector CT images of the paranasal sinuses were
obtained in a single plane without contrast.
TECHNIQUE: Multidetector CT imaging of the neck, chest, abdomen and
pelvis was performed using the standard protocol following the
bolus administration of intravenous contrast.
Contrast: 125 ml Vmnipaque-2DD

[Series 7: cap 5.0 b40f · axial · 0.87mm/px · z∈[-654,-364]mm · 3 of 118 slices shown, 4 images]
[im 30/118  soft-tissue]
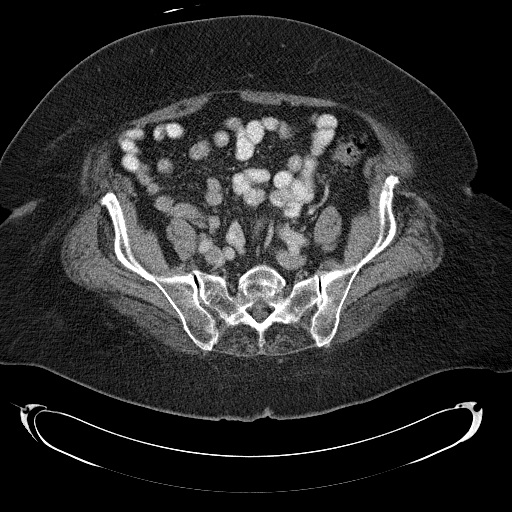
[im 30/118  bone]
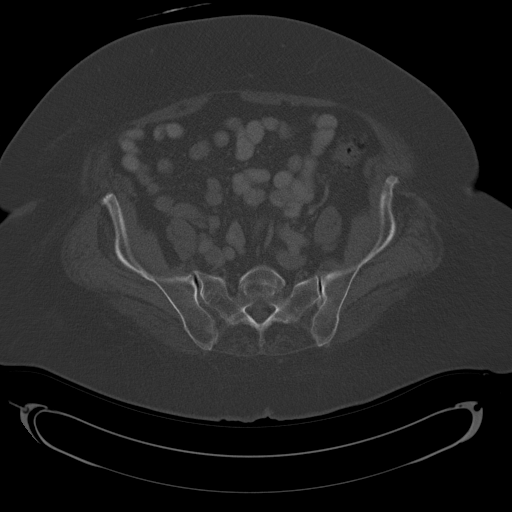
[im 59/118  bone]
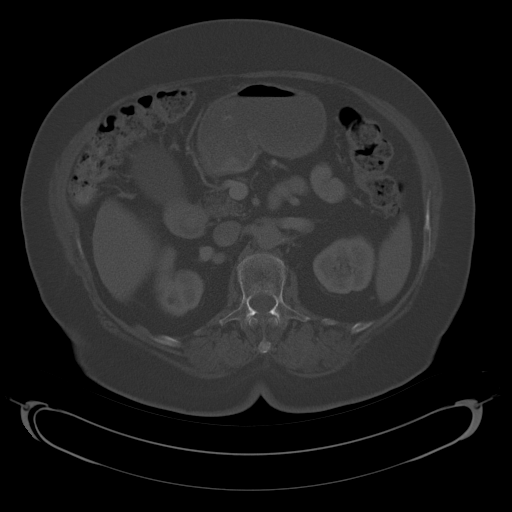
[im 88/118  bone]
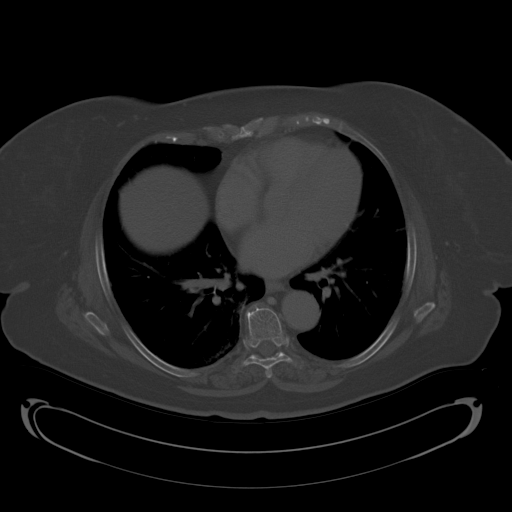

[Series 604: <mpr thick range(2)> · coronal · 1.15mm/px · 2 of 96 slices shown]
[im 32/96  bone]
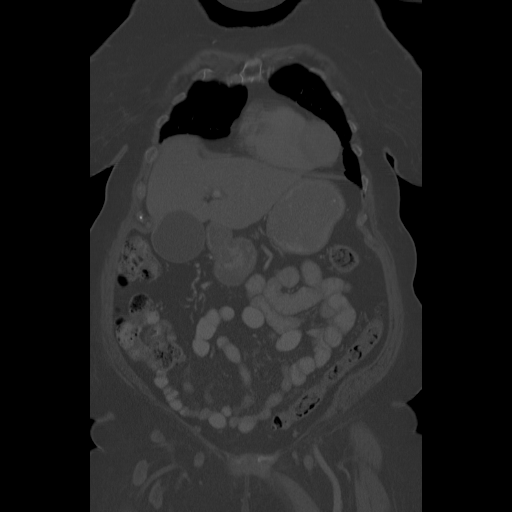
[im 64/96  bone]
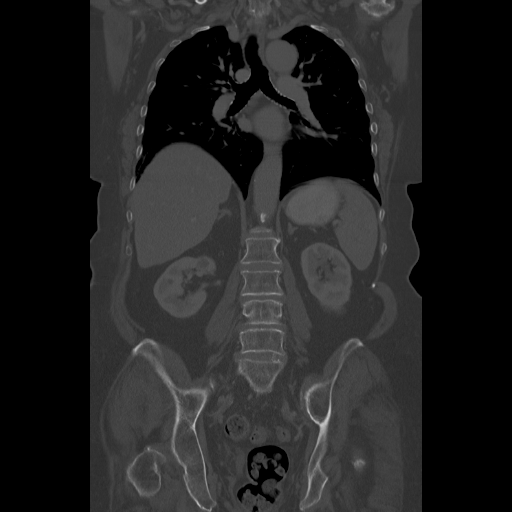

[Series 605: <mpr thick range(3)> · sagittal · 1.15mm/px · 5 of 119 slices shown]
[im 30/119  bone]
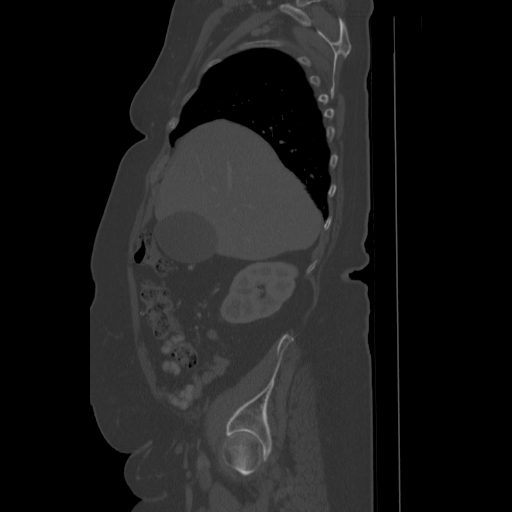
[im 45/119  bone]
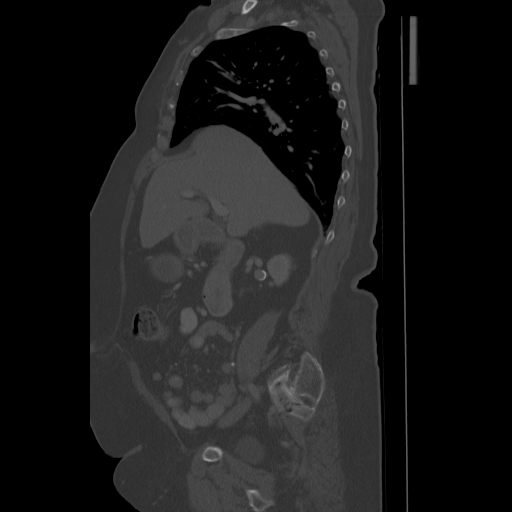
[im 60/119  bone]
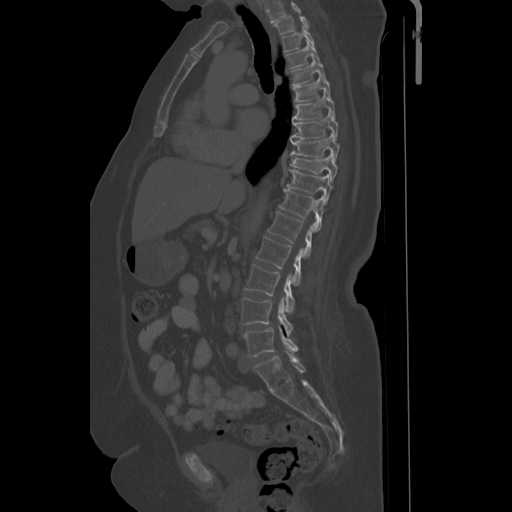
[im 74/119  bone]
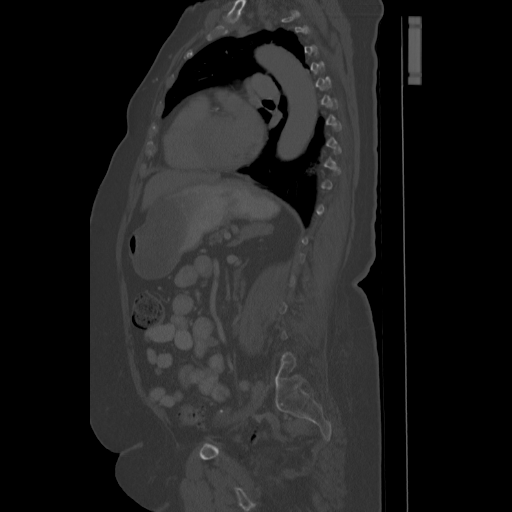
[im 89/119  bone]
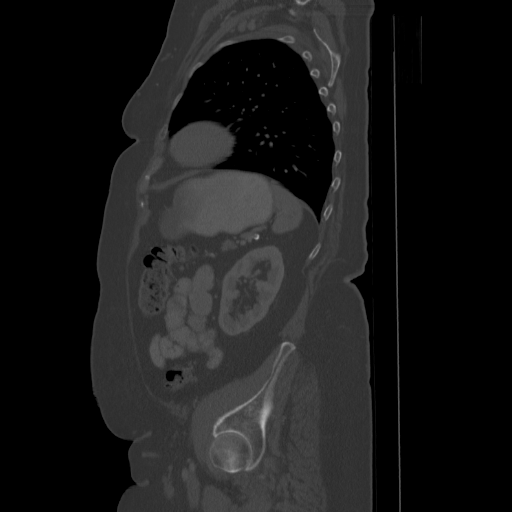

[10 of 33 positions shown; findings below may reference images not displayed]

FINDINGS: The paranasal sinuses are clear.  There is no evidence of
sinus air fluid levels or mucosal thickening.  No bone
abnormalities are seen on the paranasal sinuses and no soft tissue
masses are noted.
IMPRESSION: Negative.  No evidence of paranasal sinus disease.

CT NECK, CHEST, ABDOMEN AND PELVIS WITH CONTRAST
FINDINGS: No pathologically enlarged lymph nodes or other soft
tissue masses are identified in the neck.  Shotty small less than 1
cm lymph nodes are again seen bilaterally which are not
pathologically enlarged are stable.  The thyroid gland and salivary
glands are normal in appearance.  Parapharyngeal soft tissue planes
and larynx are unremarkable.
IMPRESSION: Stable neck CT.  No evidence of mass or pathologically enlarged
lymph nodes.

CT CHEST
FINDINGS: Shotty less than 1 cm mediastinal lymph nodes remain
stable.  No pathologically enlarged nodes or other soft tissue
masses are identified.  There is no evidence of pleural or
pericardial effusion.  Scarring in the medial right lung remains
stable, likely from prior radiation therapy.  A 5 mm nodule in the
superior segment of the right lower lobe on image number 25 remains
stable.  No new or enlarging pulmonary nodules or masses are
identified and there is no evidence of acute infiltrate.
IMPRESSION: Stable chest CT.  No evidence of lymphadenopathy or other acute
findings.

CT ABDOMEN
FINDINGS: Small approximately 1.5 cm right renal artery aneurysm
remains stable as well as small indeterminate low attenuation
lesions in the right kidney. The other abdominal parenchymal organs
are unremarkable appearance.  Chronic dilatation of the gallbladder
is unchanged.

Shotty less than 1 cm retroperitoneal lymph nodes remain stable.
No pathologically enlarged nodes are identified.  There is no
evidence of inflammatory process or ascites.  Colonic
diverticulosis is again noted.
IMPRESSION: Stable shotty retroperitoneal lymph nodes.  No evidence of new or
progressive disease within the abdomen.

CT PELVIS
FINDINGS: No pathologically enlarged lymph nodes or other soft
tissue masses are seen in the pelvis.  Shotty lymph nodes in both
inguinal regions remain stable.  Diverticulosis is noted.  There is
no evidence of diverticulitis or other inflammatory process.  No
abnormal fluid collections are seen.
IMPRESSION: Stable pelvis CT.  Diverticulosis.  No evidence of mass or
adenopathy.

## 2010-05-22 ENCOUNTER — Telehealth (INDEPENDENT_AMBULATORY_CARE_PROVIDER_SITE_OTHER): Payer: Self-pay | Admitting: *Deleted

## 2010-05-26 ENCOUNTER — Encounter: Payer: Self-pay | Admitting: Vascular Surgery

## 2010-05-27 ENCOUNTER — Encounter: Payer: Self-pay | Admitting: Vascular Surgery

## 2010-05-27 ENCOUNTER — Encounter: Payer: Self-pay | Admitting: Internal Medicine

## 2010-05-27 ENCOUNTER — Encounter: Payer: Self-pay | Admitting: Oncology

## 2010-05-28 ENCOUNTER — Telehealth: Payer: Self-pay | Admitting: Internal Medicine

## 2010-06-03 LAB — CONVERTED CEMR LAB: Pro B Natriuretic peptide (BNP): 49.8 pg/mL (ref 0.0–100.0)

## 2010-06-05 NOTE — Assessment & Plan Note (Signed)
Summary: 4 m f/u dlo   Vital Signs:  Patient profile:   77 year old female Weight:      260 pounds Temp:     98.9 degrees F oral Pulse rate:   60 / minute Pulse rhythm:   regular BP sitting:   142 / 80  (left arm) Cuff size:   large  Vitals Entered By: Mervin Hack CMA [facility]) (January 11, 2010 4:18 PM) CC: 4 month follow-up   History of Present Illness: Has lost 5# seems to be less fluid in her feet uses the torsemide when she can----can't take it if she is not home---she has such frequency and incontinence  Breathing is somewhat better still gets easy dyspnea but able to walk a little further reviewed that her echo looked pretty good  Has some foot pain from callous and bunion Back pain is not too bad some pain in back of neck but headaches are better back up to 4 oxycodone daily doesn't want to increase fentanyl due to side effects in past  back on her thyrolar getting it compounded now  No chest pain  No SOB at rest  Allergies: 1)  ! Augmentin 2)  ! Cephalexin 3)  ! Ceftin 4)  ! Sulfa  Past History:  Past medical, surgical, family and social histories (including risk factors) reviewed for relevance to current acute and chronic problems.  Past Medical History: Reviewed history from 01/01/2010 and no changes required. 1. Hypertension 2. Hypothyroidism 3. Osteoporosis 4. Fibromyalgia 5. Chronic lower extremity edema.  6. Cervical disk disease 7. Follicular lymphoma: [facility].  Dr Cherene Altes, Dr Elenore Rota 8. L frontal meningioma 9. GERD 10. Obesity 11. Cerebral hemorrhage: 2004, hypertensive.  12. H/o Urinary Tract Infection 13. Right renal artery aneurysm w/o stenosis, followed by Dr. Gilda Crease  Past Surgical History: Reviewed history from 12/22/2008 and no changes required. Cataract   L 10/05    R 4/07 [facility]) Hysterectomy--1958 Cerebral hemorrage--6/04 1985 cervical fusion 6/05  Tachycardia Left shoulder fracture--1990's Recticil/cysticil  -1982  Family History: Reviewed history from 08/19/2006 and no changes required. Dad died @80  CVAs Mom died @91  myeloma Only child No CAD, DM HTN in family No cancer  Social History: Reviewed history from 01/01/2010 and no changes required. Retired--farmed/taught  Lives in [place] Married   3 sons/1 daughter Quit smoking 50 years ago Alcohol use-no  Review of Systems       Occ gets sense of her esophagus--has to extend neck to feel better sleeping okay with new bed appetite is good---better now  Physical Exam  General:  alert and normal appearance.   Neck:  supple, no masses, no thyromegaly, and no cervical lymphadenopathy.   Lungs:  normal respiratory effort, no intercostal retractions, no accessory muscle use, and normal breath sounds.   Heart:  normal rate, regular rhythm, no murmur, and no gallop.   Extremities:  thick calves without pitting Psych:  normally interactive, good eye contact, not anxious appearing, and not depressed appearing.     Impression & Recommendations:  Problem # 1:  SHORTNESS OF BREATH (ICD-786.05) Assessment Improved slightly better with diuresis she will continue the torsemide on days when she is home  Problem # 2:  HYPERTENSION (ICD-401.9) Assessment: Unchanged  okay on adjusted regimen edema may be better off the calcium channel blocker  Her updated medication list for this problem includes:    Tekturna 150 Mg Tabs (Aliskiren fumarate) .Marland Kitchen... Take 1 tablet by mouth once a day    Toprol  Xl 200 Mg Tb24 (Metoprolol succinate) .Marland Kitchen... Take 1 tablet by mouth once daily    Lisinopril 40 Mg Tabs (Lisinopril) .Marland Kitchen... Take 1 tablet by mouth once daily    Torsemide 20 Mg Tabs (Torsemide) .Marland Kitchen... Take 1  tablets by mouth daily.  BP today: 142/80 Prior BP: 180/97 (01/01/2010)  Labs Reviewed: K+: 4.2 (05/24/2009) Creat: : 0.7 (05/24/2009)   Chol: 198 (08/11/2006)   HDL: 37.8 (08/11/2006)   LDL: DEL (08/11/2006)   TG: 216  (08/11/2006)  Orders: TLB-Renal Function Panel (80069-RENAL) TLB-CBC Platelet - w/Differential (85025-CBCD) TLB-Hepatic/Liver Function Pnl (80076-HEPATIC) TLB-TSH (Thyroid Stimulating Hormone) (84443-TSH) Venipuncture (82956)  Problem # 3:  BACK PAIN (ICD-724.5) Assessment: Unchanged okay on current regimen if needs more than 4 oxycodone daily, will add another 12 micrograms patch  Her updated medication list for this problem includes:    Oxycodone Hcl 5 Mg Tabs (Oxycodone hcl) .Marland Kitchen... 1 up to four times daily as needed for severe pain    Fentanyl 25 Mcg/hr Pt72 (Fentanyl) .Marland Kitchen... Apply 1 patch every 3 days along with 12 micrograms patch  Problem # 4:  HYPOTHYROIDISM (ICD-244.9) Assessment: Comment Only  needs check on her compounded thyrolar  Her updated medication list for this problem includes:    Thyrolar-1/4 15 (3.1-12.5) Mg (mcg) Tabs (Liotrix (t3-t4)) .Marland Kitchen... Take 1 by mouth once daily  Orders: TLB-T4 (Thyrox), Free [phone])  Complete Medication List: 1)  Tekturna 150 Mg Tabs (Aliskiren fumarate) .... Take 1 tablet by mouth once a day 2)  Thyrolar-1/4 15 (3.1-12.5) Mg (mcg) Tabs (Liotrix (t3-t4)) .... Take 1 by mouth once daily 3)  Toprol Xl 200 Mg Tb24 (Metoprolol succinate) .... Take 1 tablet by mouth once daily 4)  Oxycodone Hcl 5 Mg Tabs (Oxycodone hcl) .Marland Kitchen.. 1 up to four times daily as needed for severe pain 5)  Klor-con M20 20 Meq Tbcr (Potassium chloride crys cr) .... Take 1  tablet by mouth every day 6)  Lisinopril 40 Mg Tabs (Lisinopril) .... Take 1 tablet by mouth once daily 7)  Fentanyl 25 Mcg/hr Pt72 (Fentanyl) .... Apply 1 patch every 3 days along with 12 micrograms patch 8)  Melatonin 3 Mg Caps (Melatonin) .... Take 1 capsule by mouth once daily 9)  Nystatin 100000 Unit/gm Crea (Nystatin) .... Apply topically to affected area three times daily as needed 10)  Vitamin E Complex 400 Unit Caps (Vitamin e) .... Take 1 tablet by mouth once daily 11)  Valerian Root  450 Mg Caps (Valerian) .... Take 1 by mouth once daily 12)  Laxative Plus Stool Softener 30-100 Mg Caps (Casanthranol-docusate sodium) .... Take 1 tablet 4 times daily as needed 13)  Acidophilus Probiotic Blend Caps (Misc intestinal flora regulat) .... Take 1 tablet by mouth once daily 14)  Lutein-zeaxanthin 6-1 Mg Tabs (Lutein-zeaxanthin) .... Take 1 by mouth once daily 15)  Vitamin D3 5000 Unit/ml Liqd (Cholecalciferol) .... Take 1 by mouth once daily 16)  Torsemide 20 Mg Tabs (Torsemide) .... Take 1  tablets by mouth daily.  Patient Instructions: 1)  Please schedule a follow-up appointment in 4 months .

## 2010-06-05 NOTE — Progress Notes (Signed)
Summary: needs new scripts for [facility]  Phone Note Refill Request Message from:  Fax from Pharmacy  Refills Requested: Medication #1:  KLOR-CON M20 20 MEQ  TBCR Take 1  tablet by mouth every day  Medication #2:  LISINOPRIL 40 MG TABS take 1 tablet by mouth once daily  Medication #3:  CARDIZEM CD 180 MG  CP24 Take 1 tablet by mouth two times a day  Medication #4:  TOPROL XL 200 MG TB24 take 1 tablet by mouth once daily Faxed forms from [facility] are on your desk.  Pt is new with this company, forms will need to be faxed in.  Initial call taken by: Lowella Petties CMA,  December 15, 2009 4:02 PM  Follow-up for Phone Call        form done for 1 year refills on all Follow-up by: Cindee Salt MD,  December 18, 2009 1:51 PM  Additional Follow-up for Phone Call Additional follow up Details #1::        Rx faxed to pharmacy/ [facility] Additional Follow-up by: DeShannon Smith CMA [facility]),  December 18, 2009 2:20 PM    New/Updated Medications: KLOR-CON M20 20 MEQ  TBCR (POTASSIUM CHLORIDE CRYS CR) Take 1  tablet by mouth every day Prescriptions: LISINOPRIL 40 MG TABS (LISINOPRIL) take 1 tablet by mouth once daily  #90 Each x 3   Entered by:   Mervin Hack CMA (AAMA)   Authorized by:   Cindee Salt MD   Signed by:   Mervin Hack CMA (AAMA) on 12/18/2009   Method used:   Handwritten   RxID:   [id number] KLOR-CON M20 20 MEQ  TBCR (POTASSIUM CHLORIDE CRYS CR) Take 1  tablet by mouth every day  #90 x 3   Entered by:   Mervin Hack CMA (AAMA)   Authorized by:   Cindee Salt MD   Signed by:   Mervin Hack CMA (AAMA) on 12/18/2009   Method used:   Handwritten   RxID:   [id number] CARDIZEM CD 180 MG  CP24 (DILTIAZEM HCL COATED BEADS) Take 1 tablet by mouth two times a day  #180 Each x 3   Entered by:   Mervin Hack CMA (AAMA)   Authorized by:   Cindee Salt MD   Signed by:   Mervin Hack CMA (AAMA) on 12/18/2009   Method used:    Handwritten   RxID:   [id number] TOPROL XL 200 MG TB24 (METOPROLOL SUCCINATE) take 1 tablet by mouth once daily  #90 Each x 3   Entered by:   Mervin Hack CMA (AAMA)   Authorized by:   Cindee Salt MD   Signed by:   Mervin Hack CMA (AAMA) on 12/18/2009   Method used:   Handwritten   RxID:   [id number]

## 2010-06-05 NOTE — Progress Notes (Signed)
Summary: refill request for fentanyl  Phone Note Refill Request Message from:  daughter [name]  [phone]  Refills Requested: Medication #1:  FENTANYL 25 MCG/HR PT72 apply 1 patch every 3 days along with 12 micrograms patch Please call [name] when ready.  Initial call taken by: Lowella Petties CMA,  December 20, 2009 9:50 AM  Follow-up for Phone Call        Rx written Follow-up by: Cindee Salt MD,  December 20, 2009 1:50 PM  Additional Follow-up for Phone Call Additional follow up Details #1::        left message on machine that rx ready for pick-up  Additional Follow-up by: DeShannon Smith CMA [facility]),  December 20, 2009 1:56 PM    Prescriptions: FENTANYL 25 MCG/HR PT72 (FENTANYL) apply 1 patch every 3 days along with 12 micrograms patch  #10 x 0   Entered and Authorized by:   Cindee Salt MD   Signed by:   Cindee Salt MD on 12/20/2009   Method used:   Print then Give to Patient   RxID:   [id number]

## 2010-06-05 NOTE — Progress Notes (Signed)
Summary: fentanyl/oxycodone  Phone Note Refill Request Call back at Work Phone [phone] Message from:  Patient on February 20, 2010 9:41 AM  Refills Requested: Medication #1:  OXYCODONE HCL 5 MG TABS 1 up to four times daily as needed for severe pain  Medication #2:  FENTANYL 25 MCG/HR PT72 apply 1 patch every 3 days along with 12 micrograms patch Please call daugher, [name] when ready.   Initial call taken by: Melody Comas,  February 20, 2010 9:42 AM  Follow-up for Phone Call        Rx written Follow-up by: Cindee Salt MD,  February 20, 2010 1:35 PM  Additional Follow-up for Phone Call Additional follow up Details #1::        Spoke with patient and advised rx ready for pick-up  Additional Follow-up by: Mervin Hack CMA [facility]),  February 20, 2010 3:13 PM    Prescriptions: FENTANYL 25 MCG/HR PT72 (FENTANYL) apply 1 patch every 3 days along with 12 micrograms patch  #10 x 0   Entered and Authorized by:   Cindee Salt MD   Signed by:   Cindee Salt MD on 02/20/2010   Method used:   Print then Give to Patient   RxID:   [id number] OXYCODONE HCL 5 MG TABS (OXYCODONE HCL) 1 up to four times daily as needed for severe pain  #120 x 0   Entered and Authorized by:   Cindee Salt MD   Signed by:   Cindee Salt MD on 02/20/2010   Method used:   Print then Give to Patient   RxID:   [id number]

## 2010-06-05 NOTE — Progress Notes (Signed)
Summary: refill request for oxycodone  Phone Note Refill Request Call back at Home Phone [phone] Message from:  daughter, Alyssa Grove Requested: Medication #1:  OXYCODONE HCL 5 MG TABS 1 up to four times daily as needed for severe pain Please call daughter when ready.  Initial call taken by: Lowella Petties CMA,  October 09, 2009 10:25 AM  Follow-up for Phone Call        Rx written Follow-up by: Cindee Salt MD,  October 09, 2009 1:54 PM  Additional Follow-up for Phone Call Additional follow up Details #1::        Spoke with patient and advised rx ready for pick-up  Additional Follow-up by: Mervin Hack CMA [facility]),  October 09, 2009 2:25 PM    New/Updated Medications: OXYCODONE HCL 5 MG TABS (OXYCODONE HCL) 1 up to four times daily as needed for severe pain Prescriptions: OXYCODONE HCL 5 MG TABS (OXYCODONE HCL) 1 up to four times daily as needed for severe pain  #120 x 0   Entered and Authorized by:   Cindee Salt MD   Signed by:   Cindee Salt MD on 10/09/2009   Method used:   Print then Give to Patient   RxID:   [id number]

## 2010-06-05 NOTE — Progress Notes (Signed)
Summary: needs refill on fentanyl  Phone Note Refill Request Call back at Work Phone [phone] Message from:  daughter Alyssa Grove Requested: Medication #1:  FENTANYL 25 MCG/HR PT72 apply 1 patch every 3 days along with 12 micrograms patch Daughter says that pt is going back to just 25 mg's.  The 25 and the 12 together were too strong.  Please call when ready.  Initial call taken by: Lowella Petties CMA,  July 19, 2009 8:38 AM  Follow-up for Phone Call        Rx written  can use 2 of the 12 micrograms patces together so they are not wasted Follow-up by: Cindee Salt MD,  July 19, 2009 9:38 AM  Additional Follow-up for Phone Call Additional follow up Details #1::        left message on machine that rx ready for pick-up  Additional Follow-up by: DeShannon Smith CMA [facility]),  July 19, 2009 9:46 AM    New/Updated Medications: FENTANYL 25 MCG/HR PT72 (FENTANYL) apply 1 patch every 3 days along with 12 micrograms patch Prescriptions: FENTANYL 25 MCG/HR PT72 (FENTANYL) apply 1 patch every 3 days along with 12 micrograms patch  #10 x 0   Entered and Authorized by:   Cindee Salt MD   Signed by:   Cindee Salt MD on 07/19/2009   Method used:   Print then Give to Patient   RxID:   [id number]

## 2010-06-05 NOTE — Progress Notes (Signed)
Summary: refill request for oxycodone  Phone Note Refill Request Call back at Work Phone [phone] Message from:  daughter Alyssa Grove Requested: Medication #1:  OXYCODONE HCL 5 MG TABS 1 up to four times daily as needed for severe pain Please call when ready.  Initial call taken by: Lowella Petties CMA,  June 26, 2009 8:11 AM  Follow-up for Phone Call        Rx written Follow-up by: Cindee Salt MD,  June 26, 2009 1:35 PM  Additional Follow-up for Phone Call Additional follow up Details #1::        left message on machine that rx ready for pick-up  Additional Follow-up by: DeShannon Smith CMA [facility]),  June 26, 2009 2:11 PM    Prescriptions: OXYCODONE HCL 5 MG TABS (OXYCODONE HCL) 1 up to four times daily as needed for severe pain  #120 x 0   Entered and Authorized by:   Cindee Salt MD   Signed by:   Cindee Salt MD on 06/26/2009   Method used:   Print then Give to Patient   RxID:   [id number]

## 2010-06-05 NOTE — Progress Notes (Signed)
Summary: abdominal pain   Phone Note Call from Patient Call back at Home Phone [phone] Call back at Work Phone [phone]   Caller: Sherron Monday Call For: Cindee Salt MD Summary of Call: Patient is having abdominal pain that goes and comes. Having normal BM. She explains it as feeling full with a dull ache. Daughter is really concerned. Would like phone call back to discuss this with you. I let her know that you were out of the office for the afternoon and that it would be tomorrow before it was answered and she was okay with that.  Initial call taken by: Melody Comas,  June 07, 2009 3:52 PM  Follow-up for Phone Call        spoke with daughter Bethann Goo and distended that seems to affect her breathing and causes anxiety for her (and entire family) Eating okay. Bowels normal. No fever Does use Halls drops a lot (?sorbitol)  P: try off the drops    may need eval if that doesn't help Follow-up by: Cindee Salt MD,  June 08, 2009 1:11 PM

## 2010-06-05 NOTE — Progress Notes (Signed)
Summary: refill request for oxycodone  Phone Note Refill Request Call back at Work Phone [phone] Message from:  daughter Alyssa Grove Requested: Medication #1:  OXYCODONE HCL 5 MG TABS 1 up to four times daily as needed for severe pain Please call [name] when ready.  Initial call taken by: Lowella Petties CMA,  November 09, 2009 8:29 AM  Follow-up for Phone Call        Rx written Follow-up by: Cindee Salt MD,  November 09, 2009 1:57 PM  Additional Follow-up for Phone Call Additional follow up Details #1::        Patient advised, Rx ready for pick up will be left at front desk. Additional Follow-up by: Linde Gillis CMA [facility]),  November 09, 2009 2:17 PM    Prescriptions: OXYCODONE HCL 5 MG TABS (OXYCODONE HCL) 1 up to four times daily as needed for severe pain  #120 x 0   Entered and Authorized by:   Cindee Salt MD   Signed by:   Cindee Salt MD on 11/09/2009   Method used:   Print then Give to Patient   RxID:   [id number]

## 2010-06-05 NOTE — Progress Notes (Signed)
Summary: refill request for fentanyl, oxycodone  Phone Note Refill Request Call back at Work Phone [phone] Message from:  daughter Alyssa Grove Requested: Medication #1:  OXYCODONE HCL 5 MG TABS 1 up to four times daily as needed for severe pain  Medication #2:  FENTANYL 25 MCG/HR PT72 apply 1 patch every 3 days along with 12 micrograms patch Please call when ready.  Initial call taken by: Lowella Petties CMA,  January 22, 2010 11:16 AM  Follow-up for Phone Call        Rx written Follow-up by: Cindee Salt MD,  January 22, 2010 1:40 PM  Additional Follow-up for Phone Call Additional follow up Details #1::        left message on machine that rx ready for pick-up  Additional Follow-up by: DeShannon Smith CMA [facility]),  January 22, 2010 2:37 PM    New/Updated Medications: OXYCODONE HCL 5 MG TABS (OXYCODONE HCL) 1 up to four times daily as needed for severe pain FENTANYL 25 MCG/HR PT72 (FENTANYL) apply 1 patch every 3 days along with 12 micrograms patch Prescriptions: FENTANYL 25 MCG/HR PT72 (FENTANYL) apply 1 patch every 3 days along with 12 micrograms patch  #10 x 0   Entered and Authorized by:   Cindee Salt MD   Signed by:   Cindee Salt MD on 01/22/2010   Method used:   Print then Give to Patient   RxID:   [id number] OXYCODONE HCL 5 MG TABS (OXYCODONE HCL) 1 up to four times daily as needed for severe pain  #120 x 0   Entered and Authorized by:   Cindee Salt MD   Signed by:   Cindee Salt MD on 01/22/2010   Method used:   Print then Give to Patient   RxID:   [id number]

## 2010-06-05 NOTE — Progress Notes (Signed)
Summary: refill fentanyl  Phone Note Refill Request Message from:  Patient on May 16, 2009 9:33 AM  Refills Requested: Medication #1:  FENTANYL 25 MCG/HR  PT72 apply 1 patch every 3 days   Supply Requested: 1 month [phone]   [phone] call when ready   Method Requested: Pick up at Office Initial call taken by: Benny Lennert CMA [facility]),  May 16, 2009 9:34 AM  Follow-up for Phone Call        Rx written Follow-up by: Cindee Salt MD,  May 16, 2009 10:12 AM  Additional Follow-up for Phone Call Additional follow up Details #1::        Spoke with patient's daughter [name] and advised rx ready for pick-up  Additional Follow-up by: Mervin Hack CMA [facility]),  May 16, 2009 10:30 AM    Prescriptions: FENTANYL 25 MCG/HR  PT72 (FENTANYL) apply 1 patch every 3 days  #10 x 0   Entered and Authorized by:   Cindee Salt MD   Signed by:   Cindee Salt MD on 05/16/2009   Method used:   Print then Give to Patient   RxID:   [id number]

## 2010-06-05 NOTE — Progress Notes (Signed)
Summary: regarding fentanyl  Phone Note Call from Patient Call back at Home Phone [phone] Call back at [phone]   Caller: Daughter- [name] Summary of Call: Daughter states pt is having some problems breathing with the increased dose of fentanyl.  Family removed one of the 2 25 micrograms patches last night and pt has been fine today.  [name] is asking if they can try adding a 12.5 micrograms patch to use along with one 25.  Please advise.  They know this will not be addressed until monday. Initial call taken by: Lowella Petties CMA,  June 02, 2009 4:08 PM  Follow-up for Phone Call        okay  We will use 1 patch and 1-- 12 micrograms patch at the same time instead of 50 New Rx done have them return Rx for 50 micrograms patches and destroy Follow-up by: Cindee Salt MD,  June 05, 2009 10:16 AM  Additional Follow-up for Phone Call Additional follow up Details #1::        spoke with daughter [name] and the pharmacist told her she only had a week to fill the 50mg  patches so she filled it, she said she will throw those away.  Spoke with patient's daughter and advised rx ready for pick-up . DeShannon Katrinka Blazing CMA Duncan Dull)  June 05, 2009 10:23 AM   That is fine I trust her about this Additional Follow-up by: Cindee Salt MD,  June 05, 2009 1:36 PM    New/Updated Medications: FENTANYL 25 MCG/HR PT72 (FENTANYL) apply 1 patch every 3 days along with 12 micrograms patch FENTANYL 12 MCG/HR PT72 (FENTANYL) Apply 1 patch along with 25 micrograms patch every 3 days Prescriptions: FENTANYL 12 MCG/HR PT72 (FENTANYL) Apply 1 patch along with 25 micrograms patch every 3 days  #10 x 0   Entered and Authorized by:   Cindee Salt MD   Signed by:   Cindee Salt MD on 06/05/2009   Method used:   Print then Give to Patient   RxID:   [id number] FENTANYL 25 MCG/HR PT72 (FENTANYL) apply 1 patch every 3 days along with 12 micrograms patch  #10 x 0   Entered  and Authorized by:   Cindee Salt MD   Signed by:   Cindee Salt MD on 06/05/2009   Method used:   Print then Give to Patient   RxID:   [id number]

## 2010-06-05 NOTE — Assessment & Plan Note (Signed)
Summary: F1M/ALT   Visit Type:  Follow-up Primary Conlan Miceli:  Tillman Abide, MD  CC:  c/o shortness of breath; mostly after eats and is off balance.Marland Kitchen  History of Present Illness: 77 yo with history of HTN, obesity, and chronic lower extremity edema presents for cardiology followup.  She has had leg swelling bilaterally for about 10 years.  She has been seen by vascular surgery in [place] and is thought to have a component of venous insufficiency. She has been short of breath after walking 30 feet for at least the last year.  She sleeps with 3 pillows at night, mainly for GERD rather than orthopnea.  No chest pain/tightness.  BP is still elevated, 188/120.    When I last saw her, I stopped diltiazem CD given lower extremity swelling as it may have contributed.  Patient denies any improvement off diltiazem.  Echo was suggestive of diastolic dysfunction with preserved EF, mild LVH, and mild pulmonary hypertension.  Weight is up 1 lb since last appointment.  Patient is still not taking torsemide every day as she has problems with urinary incontinence.    Labs (8/11): BNP 50 Labs (9/11): LDL 133, HDL 53, K 4.0, creatinine 0.8, LFTs normal, TSH normal  Current Medications (verified): 1)  Tekturna 150 Mg Tabs (Aliskiren Fumarate) .... Take 1 Tablet By Mouth Once A Day 2)  Thyrolar-1/4 15 (3.1-12.5) Mg (Mcg) Tabs (Liotrix (T3-T4)) .... Take 1 By Mouth Once Daily 3)  Toprol Xl 200 Mg Tb24 (Metoprolol Succinate) .... Take 1 Tablet By Mouth Once Daily 4)  Oxycodone Hcl 5 Mg Tabs (Oxycodone Hcl) .Marland Kitchen.. 1 Up To Four Times Daily As Needed For Severe Pain 5)  Klor-Con M20 20 Meq  Tbcr (Potassium Chloride Crys Cr) .... Take 1  Tablet By Mouth Every Day 6)  Lisinopril 40 Mg Tabs (Lisinopril) .... Take 1 Tablet By Mouth Once Daily 7)  Fentanyl 25 Mcg/hr Pt72 (Fentanyl) .... Apply 1 Patch Every 3 Days Along With 12 Micrograms Patch 8)  Melatonin 3 Mg Caps (Melatonin) .... Take 1 Capsule By Mouth Once Daily 9)   Nystatin 100000 Unit/gm  Crea (Nystatin) .... Apply Topically To Affected Area Three Times Daily As Needed 10)  Vitamin E Complex 400 Unit Caps (Vitamin E) .... Take 1 Tablet By Mouth Once Daily 11)  Valerian Root 450 Mg Caps (Valerian) .... Take 1 By Mouth Once Daily 12)  Laxative Plus Stool Softener 30-100 Mg Caps (Casanthranol-Docusate Sodium) .... Take 1 Tablet 4 Times Daily As Needed 13)  Acidophilus Probiotic Blend  Caps (Misc Intestinal Flora Regulat) .... Take 1 Tablet By Mouth Once Daily 14)  Lutein-Zeaxanthin 6-1 Mg Tabs (Lutein-Zeaxanthin) .... Take 1 By Mouth Once Daily 15)  Vitamin D3 5000 Unit/ml Liqd (Cholecalciferol) .... Take 1 By Mouth Once Daily 16)  Torsemide 20 Mg Tabs (Torsemide) .... Take 1  Tablets By Mouth Daily.  Allergies (verified): 1)  ! Augmentin 2)  ! Cephalexin 3)  ! Ceftin 4)  ! Sulfa  Past History:  Past Surgical History: Last updated: 12/22/2008 Cataract   L 10/05    R 4/07 [facility]) Hysterectomy--1958 Cerebral hemorrage--6/04 1985 cervical fusion 6/05  Tachycardia Left shoulder fracture--1990's Recticil/cysticil -1982  Family History: Last updated: Sep 07, 2006 Dad died @80  CVAs Mom died @91  myeloma Only child No CAD, DM HTN in family No cancer  Social History: Last updated: 01/01/2010 Retired--farmed/taught  Lives in [place] Married   3 sons/1 daughter Quit smoking 50 years ago Alcohol use-no  Risk Factors: Smoking Status:  never (08/19/2006)  Past Medical History: 1. Hypertension 2. Hypothyroidism 3. Osteoporosis 4. Fibromyalgia 5. Chronic lower extremity edema.  6. Cervical disk disease 7. Follicular lymphoma: Quiescent.  Dr Cherene Altes, Dr Elenore Rota 8. L frontal meningioma 9. GERD 10. Obesity 11. Cerebral hemorrhage: 2004, hypertensive.  12. H/o Urinary Tract Infection 13. Right renal artery aneurysm w/o stenosis, followed by Dr. Gilda Crease 14. Diastolic CHF: Echo (9/11) with EF 65-70%, mild LVH, mid AI, mild MR, PA  systolic pressure > 40 mmHg.   Family History: Reviewed history from 08/19/2006 and no changes required. Dad died @80  CVAs Mom died @91  myeloma Only child No CAD, DM HTN in family No cancer  Social History: Reviewed history from 01/01/2010 and no changes required. Retired--farmed/taught  Lives in [place] Married   3 sons/1 daughter Quit smoking 50 years ago Alcohol use-no  Review of Systems       All systems reviewed and negative except as per HPI.   Vital Signs:  Patient profile:   77 year old female Height:      62.5 inches Weight:      266 pounds BMI:     48.05 Pulse rate:   66 / minute BP sitting:   188 / 120  (left arm) Cuff size:   large  Vitals Entered By: Bishop Dublin, CMA (January 29, 2010 2:15 PM)  Physical Exam  General:  Well developed, well nourished, in no acute distress. Obesity.  Neck:  Neck thick, JVP 8-9 cm. No masses, thyromegaly or abnormal cervical nodes. Lungs:  Clear bilaterally to auscultation and percussion. Heart:  Non-displaced PMI, chest non-tender; regular rate and rhythm, S1, S2 without rubs or gallops. 2/6 SEM RUSB.  Carotid upstroke normal, no bruit.  Pedals normal pulses. 2+ edema to thighs bilaterally. Abdomen:  Bowel sounds positive; abdomen soft and non-tender without masses, organomegaly, or hernias noted. No hepatosplenomegaly. Extremities:  No clubbing or cyanosis. Neurologic:  Alert and oriented x 3. Psych:  Normal affect.   Impression & Recommendations:  Problem # 1:  CHF (ICD-428.0) Diastolic CHF, likely related to HTN.  Given the mild pulmonary hypertension, there may be a component of OSA as well.  She is stable symptomatically.  She feels like she is unable to increase her torsemide due to urinary incontinence.  She has not tolerated compression stockings.  She needs to avoid sodium better in her diet.  Finally, she should have a sleep study eventually.   Problem # 2:  HYPERTENSION (ICD-401.9) BP still running  high despite alteration in her meds.  She did not show any improvement in lower extremity swelling after stopping diltiazem.  - Increase aliskiren to 300 mg daily with BMET in 2 wks.  - Start Norvasc 5 mg daily - Will call for BP check in 2 wks.  - Followup in 1 month.    I asked her to take ASA 81 mg daily.   Patient Instructions: 1)  Your physician recommends that you return for lab work in: 2 weeks. BMP 2)  Your physician has recommended you make the following change in your medication: INCREASE tekturna 300mg  daily START amolodipine 5 mg daily  Prescriptions: AMLODIPINE BESYLATE 5 MG TABS (AMLODIPINE BESYLATE) Take one tablet by mouth daily  #30 x 6   Entered by:   Benedict Needy, RN   Authorized by:   Marca Ancona, MD   Signed by:   Benedict Needy, RN on 01/29/2010   Method used:   Electronically to  [facility] [address] (retail)       [address], [address]       [place], [place]  [address]       Ph: [phone]       Fax: [phone]   RxID:   [id number] TEKTURNA 300 MG TABS (ALISKIREN FUMARATE) Take 1 tablet by mouth once a day  #30 x 6   Entered by:   Benedict Needy, RN   Authorized by:   Marca Ancona, MD   Signed by:   Benedict Needy, RN on 01/29/2010   Method used:   Electronically to        [facility] [address] (retail)       [address], [address]       [place], [place]  [address]       Ph: [phone]       Fax: [phone]   RxID:   [id number]

## 2010-06-05 NOTE — Assessment & Plan Note (Signed)
Summary: ROA 4 MTHS CYD   Vital Signs:  Patient profile:   77 year old female Weight:      258 pounds Temp:     98.8 degrees F oral Pulse rate:   74 / minute Pulse rhythm:   regular BP sitting:   150 / 70  (left arm) Cuff size:   large  Vitals Entered By: Mervin Hack CMA Duncan Dull) (May 24, 2009 12:24 PM) CC: 4 month follow-up   History of Present Illness: "I am consistently uncomfortable" Has pulling and digging sensation "Legs bother me" Burning in feet and abdominal area Patch helps but doesn't clear things up Pain and discomfort is keeping her up at night oxycodone does help  Leg swelling has improved hasn't needed diuretic for about 2 weeks  Incontinence is about the same anyway Has appt with Dr Wanda Plump to review options cysto and ultrasound were  okay  tries to continue to walk regularly  Stomach "has it's days" has tried to restrict gluten just in case--nothing is clear cut  Allergies: 1)  ! Augmentin 2)  ! Cephalexin 3)  ! Ceftin 4)  ! Sulfa  Past History:  Past medical, surgical, family and social histories (including risk factors) reviewed for relevance to current acute and chronic problems.  Past Medical History: Reviewed history from 01/20/2009 and no changes required. Hypertension Hypothyroidism Osteoporosis Fibromyalgia Edema Cervical disk disease Follicular lymphoma-------------------------------Dr Cherene Altes, Dr Elenore Rota L frontal meningioma GERD Obesity Stroke Urinary Tract Infection  Past Surgical History: Reviewed history from 12/22/2008 and no changes required. Cataract   L 10/05    R 4/07 [facility]) Hysterectomy--1958 Cerebral hemorrage--6/04 1985 cervical fusion 6/05  Tachycardia Left shoulder fracture--1990's Recticil/cysticil -1982  Family History: Reviewed history from 08/19/2006 and no changes required. Dad died @80  CVAs Mom died @91  myeloma Only child No CAD, DM HTN in family No cancer  Social  History: Reviewed history from 02/22/2008 and no changes required. Retired--farmed/taught school/homemaker Married   3 sons/1 daughter Quit smoking 50 years ago Alcohol use-no  Review of Systems       having some soreness in left eye--has been seeing her eye doctor some dizziness weight is stable  Physical Exam  General:  alert.  NAD Neck:  supple, no masses, and no cervical lymphadenopathy.   Lungs:  normal respiratory effort and normal breath sounds.   Heart:  normal rate, regular rhythm, no murmur, and no gallop.   Abdomen:  soft and non-tender.   Extremities:  thick legs without pitting Psych:  normally interactive, good eye contact, and dysphoric affect.     Impression & Recommendations:  Problem # 1:  DISC DISEASE, CERVICAL (ICD-722.4) Assessment Deteriorated pain is worse  will increase fentanyl patch  Problem # 2:  INCONTINENCE (ICD-788.30) Assessment: Unchanged ongoing work up  Problem # 3:  HYPERTENSION (ICD-401.9) Assessment: Unchanged  reasonable control no changes needed  The following medications were removed from the medication list:    Demadex 20 Mg Tabs (Torsemide) .Marland Kitchen... Take 1 tablet by mouth two times a day Her updated medication list for this problem includes:    Toprol Xl 200 Mg Tb24 (Metoprolol succinate) .Marland Kitchen... Take 1 tablet by mouth once daily    Cardizem Cd 180 Mg Cp24 (Diltiazem hcl coated beads) .Marland Kitchen... Take 1 tablet by mouth two times a day    Lisinopril 40 Mg Tabs (Lisinopril) .Marland Kitchen... Take 1 tablet by mouth once daily  BP today: 150/70 Prior BP: 124/80 (02/27/2009)  Labs Reviewed: K+: 3.6 (09/19/2008) Creat: :  0.7 (09/19/2008)   Chol: 198 (08/11/2006)   HDL: 37.8 (08/11/2006)   LDL: DEL (08/11/2006)   TG: 216 (08/11/2006)  Orders: TLB-Renal Function Panel (80069-RENAL) TLB-Hepatic/Liver Function Pnl (80076-HEPATIC) Venipuncture (29562)  Problem # 4:  REFLUX ESOPHAGITIS (ICD-530.11) Assessment: Improved still some symptoms but  better may be more intestinal now  Complete Medication List: 1)  Toprol Xl 200 Mg Tb24 (Metoprolol succinate) .... Take 1 tablet by mouth once daily 2)  Oxycodone Hcl 5 Mg Tabs (Oxycodone hcl) .Marland Kitchen.. 1 up to four times daily as needed for severe pain 3)  Thyrolar-1 60 Mg Tabs (Liotrix) .Marland Kitchen.. 1 daily 4)  Cardizem Cd 180 Mg Cp24 (Diltiazem hcl coated beads) .... Take 1 tablet by mouth two times a day 5)  Klor-con M20 20 Meq Tbcr (Potassium chloride crys cr) .... Take 1  tablet by mouth every day 6)  Nystatin 100000 Unit/gm Crea (Nystatin) .... Apply topically to affected area three times daily as needed 7)  Vitamin E Complex 400 Unit Caps (Vitamin e) .... Take 1 tablet by mouth once daily 8)  Valerian Root 450 Mg Caps (Valerian) .... Take 1 by mouth once daily 9)  Laxative Plus Stool Softener 30-100 Mg Caps (Casanthranol-docusate sodium) .... Take 1 tablet 4 times daily as needed 10)  Acidophilus Probiotic Blend Caps (Misc intestinal flora regulat) .... Take 1 tablet by mouth once daily 11)  Melatonin 3 Mg Caps (Melatonin) .... Take 1 capsule by mouth once daily 12)  Lisinopril 40 Mg Tabs (Lisinopril) .... Take 1 tablet by mouth once daily 13)  Diaper Rash Cream Otc  .... As needed 14)  Vitamin D3 5000 Unit/ml Liqd (Cholecalciferol) .... Take 1 by mouth once daily 15)  Eq Omeprazole 20 Mg Tbec (Omeprazole) .... Take 1 by mouth once daily 16)  Phenazopyridine Hcl 200 Mg Tabs (Phenazopyridine hcl) .... Take 1 by mouth once daily as needed 17)  Lutein-zeaxanthin 6-1 Mg Tabs (Lutein-zeaxanthin) .... Take 1 by mouth once daily 18)  Fentanyl 50 Mcg/hr Pt72 (Fentanyl) .... Apply 1 patch every 3 days  Other Orders: TLB-TSH (Thyroid Stimulating Hormone) (84443-TSH) TLB-T4 (Thyrox), Free [phone])  Patient Instructions: 1)  Please schedule a follow-up appointment in 4 months .  Prescriptions: FENTANYL 50 MCG/HR PT72 (FENTANYL) apply 1 patch every 3 days  #10 x 0   Entered and Authorized by:    Cindee Salt MD   Signed by:   Cindee Salt MD on 05/24/2009   Method used:   Print then Give to Patient   RxID:   [id number]   Current Allergies (reviewed today): ! AUGMENTIN ! CEPHALEXIN ! CEFTIN ! SULFA

## 2010-06-05 NOTE — Progress Notes (Signed)
Summary: wants referral  Phone Note Call from Patient Call back at Home Phone [phone] Call back at Work Phone [phone]   Caller: Daughter Call For: Cindee Salt MD Summary of Call: Daughter is requesting a referral to Dr. Elizabeth Palau which is with the [facility]. He is an Control and instrumentation engineer. Please advise. Initial call taken by: Melody Comas,  July 31, 2009 2:38 PM  Follow-up for Phone Call        okay to make referral Follow-up by: Cindee Salt MD,  July 31, 2009 3:02 PM    Referral faxed to Physician requested. Carlton Adam  August 11, 2009 11:06 AM

## 2010-06-05 NOTE — Progress Notes (Signed)
Summary: refill request for fentanyl  Phone Note Refill Request Call back at Work Phone [phone] Message from:  daughter Alyssa Grove Requested: Medication #1:  FENTANYL 25 MCG/HR PT72 apply 1 patch every 3 days along with 12 micrograms patch. Please call daughter when ready.  Initial call taken by: Lowella Petties CMA,  August 22, 2009 8:24 AM  Follow-up for Phone Call        Rx written Follow-up by: Cindee Salt MD,  August 22, 2009 9:04 AM  Additional Follow-up for Phone Call Additional follow up Details #1::        Spoke with patient and advised rx ready for pick-up  Additional Follow-up by: Mervin Hack CMA Duncan Dull),  August 22, 2009 9:13 AM    Prescriptions: FENTANYL 25 MCG/HR PT72 (FENTANYL) apply 1 patch every 3 days along with 12 micrograms patch  #10 x 0   Entered and Authorized by:   Cindee Salt MD   Signed by:   Cindee Salt MD on 08/22/2009   Method used:   Print then Give to Patient   RxID:   [id number]

## 2010-06-05 NOTE — Progress Notes (Signed)
Summary: needs refill on oxycodone, ? about thyroid medicine  Phone Note Refill Request Call back at Home Phone [phone] Message from:  daughter Alyssa Grove Requested: Medication #1:  OXYCODONE HCL 5 MG TABS 1 up to four times daily as needed for severe pain Please call when ready.  Also, pt is having a problem with finding thyrolar, none of the pharmacies have it.  [facility] told her they can mix up some armour thyroid if ordered.  She has been told that thyrolar should be available on 6/24, but there is no guarantee that all pharmacies will be getting it.  Daughter is asking if ok for pt to go without thyroid medicine that long, or should they get the armour thyroid.  Please advise.  Initial call taken by: Lowella Petties CMA,  Sep 04, 2009 10:19 AM  Follow-up for Phone Call        okay to use the armour thyroid in equivalent dose  oxycodone written Follow-up by: Cindee Salt MD,  Sep 04, 2009 2:24 PM  Additional Follow-up for Phone Call Additional follow up Details #1::        Spoke with patient and advised rx ready for pick-up. daughter would like rx written for compounding pharmacy to have them "mix-up" armour thyroid. DeShannon Smith CMA Duncan Dull)  Sep 04, 2009 2:32 PM   daughter came in today for rx of thyroid med. I asked her if her mother has ever taken synthroid and she said yes, but she (the daugher) states it doesn't digest like the [facility] and that it was made from horse urine per daugher. I called [facility] and they state the difference is synthroid is synthetic and amour thyroid is made from "pigs gland"? daughter took some information home to her mother and I also advised her to stop by the compounding pharmacy for more information. Daughter also stated that the pharmacy in [place] said that Shana Chute will be released again in June. She has been giving her mother some synthroid just "every now and then" to replace the Thyrolar.  Please advise. DeShannon Smith CMA Duncan Dull)  Sep 05, 2009 1:19 PM   I would replace her with levothyroxine 75 micrograms daily and check free T4 in 6 weeks if she is going to change. Okay for compounding or waiting till June with fill in with synthroid if that is what they decide Cindee Salt MD  Sep 05, 2009 2:14 PM   [facility], pt got med from them Additional Follow-up by: Mervin Hack CMA Duncan Dull),  Sep 06, 2009 3:07 PM    New/Updated Medications: OXYCODONE HCL 5 MG TABS (OXYCODONE HCL) 1 up to four times daily as needed for severe pain Prescriptions: OXYCODONE HCL 5 MG TABS (OXYCODONE HCL) 1 up to four times daily as needed for severe pain  #120 x 0   Entered and Authorized by:   Cindee Salt MD   Signed by:   Cindee Salt MD on 09/04/2009   Method used:   Print then Give to Patient   RxID:   [id number]

## 2010-06-05 NOTE — Assessment & Plan Note (Signed)
Summary: ROA 4 MTHS  CYD   Vital Signs:  Patient profile:   77 year old female Weight:      263 pounds O2 Sat:      93 % on Room air Temp:     98.9 degrees F oral Pulse rate:   52 / minute Pulse rhythm:   regular Resp:     22 per minute BP sitting:   140 / 80  (left arm) Cuff size:   large  Vitals Entered By: Mervin Hack CMA Duncan Dull) (Sep 19, 2009 11:01 AM)  O2 Flow:  Room air CC: 4 month follow-up   History of Present Illness: Doing okay but not great Feels her thinking has slowed down due to lack of thyrolar Using levothyroxine in the mean time---causes some agitation ("addled") Daughter not sure of the dose they tried  still with chronic pain most noticeable in neck and down left hand oxycodone helps---needs 2-3 per day in addition to the patch No sig left arm weakness though  Urinary incontinence has worsened needs pain day and night  No chest pain  No SOB---except from stomach weight notices slowing of movement --like in and out of car  Allergies: 1)  ! Augmentin 2)  ! Cephalexin 3)  ! Ceftin 4)  ! Sulfa  Past History:  Past medical, surgical, family and social histories (including risk factors) reviewed for relevance to current acute and chronic problems.  Past Medical History: Reviewed history from 01/20/2009 and no changes required. Hypertension Hypothyroidism Osteoporosis Fibromyalgia Edema Cervical disk disease Follicular lymphoma-------------------------------Dr Cherene Altes, Dr Elenore Rota L frontal meningioma GERD Obesity Stroke Urinary Tract Infection  Past Surgical History: Reviewed history from 12/22/2008 and no changes required. Cataract   L 10/05    R 4/07 [facility]) Hysterectomy--1958 Cerebral hemorrage--6/04 1985 cervical fusion 6/05  Tachycardia Left shoulder fracture--1990's Recticil/cysticil -1982  Family History: Reviewed history from 08/19/2006 and no changes required. Dad died @80  CVAs Mom died @91  myeloma Only  child No CAD, DM HTN in family No cancer  Social History: Reviewed history from 02/22/2008 and no changes required. Retired--farmed/taught school/homemaker Married   3 sons/1 daughter Quit smoking 50 years ago Alcohol use-no  Review of Systems       Tries to be careful with eating  weight still up 5# though sleeping more now--uses the oxycodone to control pain then Nocturia also disturbs sleep some  Physical Exam  General:  alert and normal appearance.   Neck:  no masses and no cervical lymphadenopathy.   Lungs:  normal respiratory effort and normal breath sounds.   Heart:  normal rate, regular rhythm, no murmur, and no gallop.   Abdomen:  soft and non-tender.   Extremities:  no edema Psych:  normally interactive, good eye contact, not anxious appearing, and not depressed appearing.     Impression & Recommendations:  Problem # 1:  DISC DISEASE, CERVICAL (ICD-722.4) Assessment Unchanged doing okay on current narcotics  Problem # 2:  HYPOTHYROIDISM (ICD-244.9) Assessment: Comment Only asked daughter to restart the levothyroxine they have at half dose [facility]  Her updated medication list for this problem includes:    Thyrolar-1 60 Mg Tabs (Liotrix) .Marland Kitchen... 1 daily  Problem # 3:  HYPERTENSION (ICD-401.9) Assessment: Unchanged good control no changes needed  Her updated medication list for this problem includes:    Toprol Xl 200 Mg Tb24 (Metoprolol succinate) .Marland Kitchen... Take 1 tablet by mouth once daily    Cardizem Cd 180 Mg Cp24 (  Diltiazem hcl coated beads) .Marland Kitchen... Take 1 tablet by mouth two times a day    Lisinopril 40 Mg Tabs (Lisinopril) .Marland Kitchen... Take 1 tablet by mouth once daily  BP today: 140/80 Prior BP: 150/70 (05/24/2009)  Labs Reviewed: K+: 4.2 (05/24/2009) Creat: : 0.7 (05/24/2009)   Chol: 198 (08/11/2006)   HDL: 37.8 (08/11/2006)   LDL: DEL (08/11/2006)   TG: 216 (08/11/2006)  Problem # 4:  LYMPHOMA, NODULAR  (ICD-202.00) Assessment: Unchanged still seems to be quiet  Complete Medication List: 1)  Toprol Xl 200 Mg Tb24 (Metoprolol succinate) .... Take 1 tablet by mouth once daily 2)  Oxycodone Hcl 5 Mg Tabs (Oxycodone hcl) .Marland Kitchen.. 1 up to four times daily as needed for severe pain 3)  Thyrolar-1 60 Mg Tabs (Liotrix) .Marland Kitchen.. 1 daily 4)  Cardizem Cd 180 Mg Cp24 (Diltiazem hcl coated beads) .... Take 1 tablet by mouth two times a day 5)  Klor-con M20 20 Meq Tbcr (Potassium chloride crys cr) .... Take 1  tablet by mouth every day 6)  Lisinopril 40 Mg Tabs (Lisinopril) .... Take 1 tablet by mouth once daily 7)  Vitamin D3 5000 Unit/ml Liqd (Cholecalciferol) .... Take 1 by mouth once daily 8)  Fentanyl 25 Mcg/hr Pt72 (Fentanyl) .... Apply 1 patch every 3 days along with 12 micrograms patch 9)  Melatonin 3 Mg Caps (Melatonin) .... Take 1 capsule by mouth once daily 10)  Nystatin 100000 Unit/gm Crea (Nystatin) .... Apply topically to affected area three times daily as needed 11)  Vitamin E Complex 400 Unit Caps (Vitamin e) .... Take 1 tablet by mouth once daily 12)  Valerian Root 450 Mg Caps (Valerian) .... Take 1 by mouth once daily 13)  Laxative Plus Stool Softener 30-100 Mg Caps (Casanthranol-docusate sodium) .... Take 1 tablet 4 times daily as needed 14)  Acidophilus Probiotic Blend Caps (Misc intestinal flora regulat) .... Take 1 tablet by mouth once daily 15)  Lutein-zeaxanthin 6-1 Mg Tabs (Lutein-zeaxanthin) .... Take 1 by mouth once daily  Patient Instructions: 1)  Please schedule a follow-up appointment in 4 months .  Prescriptions: FENTANYL 25 MCG/HR PT72 (FENTANYL) apply 1 patch every 3 days along with 12 micrograms patch  #10 x 0   Entered and Authorized by:   Cindee Salt MD   Signed by:   Cindee Salt MD on 09/19/2009   Method used:   Print then Give to Patient   RxID:   [id number]   Current Allergies (reviewed today): ! AUGMENTIN ! CEPHALEXIN ! CEFTIN ! SULFA

## 2010-06-05 NOTE — Progress Notes (Signed)
Summary: ? about Fentanyl patch  Phone Note Call from Patient Call back at [phone] or [phone]   Caller: Daughter,[name] Mitzi Hansen Call For: Cindee Salt MD Summary of Call: Pt's daughter, Minerva Bluett called Pt put on Fentanyl two 25mg  patches on 05/24/09 and this morning pt said she felt better but was more sleepy than usual. [name] picked up Fentanyl at pharmacy and pharmacist mentioned there is a patch. [name] wonders if Dr. Alphonsus Sias would consider trying Fentanyl patch and Fentanyl patch to see if that will make pt comfortable but not sleepy. Please call [name] with Dr. Karle Starch decision or if rx is ready for pick up. Please advise.  Initial call taken by: Lewanda Rife LPN,  May 25, 2009 3:36 PM  Follow-up for Phone Call        I would recommend increasing to the 50 micrograms If she was just a bit sleepy at first and nothing more serious, that will wear off and I think she needs the bigger dose overall Cindee Salt MD  May 25, 2009 3:52 PM   Additional Follow-up for Phone Call Additional follow up Details #1::        Advised pt and [name]. Additional Follow-up by: Lowella Petties CMA,  May 25, 2009 3:58 PM

## 2010-06-05 NOTE — Progress Notes (Signed)
Summary: refill request for oxycodone  Phone Note Refill Request Call back at Home Phone [phone] Call back at Work Phone [phone] Message from:  Daughter  [name]  Refills Requested: Medication #1:  OXYCODONE HCL 5 MG TABS 1 up to four times daily as needed for severe pain Please call when ready.  Initial call taken by: Lowella Petties CMA,  December 12, 2009 8:36 AM  Follow-up for Phone Call        Rx written Follow-up by: Cindee Salt MD,  December 12, 2009 9:06 AM  Additional Follow-up for Phone Call Additional follow up Details #1::        Patient's daughter [name]) notified that rx is up front and ready for pickup. Additional Follow-up by: Sydell Axon LPN,  December 12, 2009 1:00 PM    Prescriptions: OXYCODONE HCL 5 MG TABS (OXYCODONE HCL) 1 up to four times daily as needed for severe pain  #120 x 0   Entered and Authorized by:   Cindee Salt MD   Signed by:   Cindee Salt MD on 12/12/2009   Method used:   Print then Give to Patient   RxID:   [id number]

## 2010-06-05 NOTE — Letter (Signed)
Summary: PHI  PHI   Imported By: Harlon Flor 01/17/2010 11:52:33  _____________________________________________________________________  External Attachment:    Type:   Image     Comment:   External Document

## 2010-06-05 NOTE — Progress Notes (Signed)
Summary: SLEEP TEST   Phone Note Call from Patient Call back at Home Phone [phone]   Caller: SELF Call For: [facility] Summary of Call: PT DOES NOT WANT TO TAKE THE SLEEP TEST THROUGH NIGHTWATCH-PT STATES THAT APNEA IS NOT A PROBLEM WITH HER-PT IS GOING TO RETURN THE EQUIPMENT Initial call taken by: Harlon Flor,  March 22, 2010 9:47 AM  Follow-up for Phone Call        Called spoke with pt and she does not wish to have nightwatch sleep study done at this time, she is going to return the equipment.  Pt states she does not snore at night, explained that fact does not mean she does not have sleep apnea.  Pt states she feels she is drowsy during the day d/t to not sleeping well at night secondary to veterbral pain and taking oxycodone four times a day.  Pt's brother-in-law and sister-in-law both recently passed away out of town and she is currently on Cipro for UTI and she does not feel at this time she is able to persue any further work-up.  She will call back if she changes her mind and wishes to undergo sleep study.  Follow-up by: Cloyde Reams RN,  March 22, 2010 10:29 AM

## 2010-06-05 NOTE — Progress Notes (Signed)
Summary: Frequent urination with external irritation/Fentanyl Patch  Phone Note Call from Patient Call back at Home Phone [phone] Call back at Work Phone [phone]   Caller: Daughter, [name] Call For: Cindee Salt MD Summary of Call: 1. Patient has been experiencing burning externally at the vaginal area.  She has had frequent urination with less control x 2-3 weeks but the burning has just started over the last 2-3 days.  She is not taking any diuretics but she continues to constantly need to urinate.  Please advise. 2. She also needs a Rx. for her Fentanyl Patch for pick up.  Initial call taken by: Delilah Shan CMA [facility] Dull),  October 26, 2009 9:18 AM  Follow-up for Phone Call        Rx written  If the irritation and urinary symptoms persist, she will need appt to check her urine and the vaginal area for any abnormalities can add on tomorrow as needed  She can try some protective cream like zinc oxide to protect the vulvar area in case it is just being irritated by the urine Follow-up by: Cindee Salt MD,  October 26, 2009 1:28 PM  Additional Follow-up for Phone Call Additional follow up Details #1::        daughter came in the office to pick up rx and asked if her mom could be seen? I added her on tomorrow at 2:00. Daughter states that mom  is urinating alot, she's wearing 2 pads and depends and it still comes thru, pt thinks she needs her bladder "pulled" back up, but because somewere in pt's chart it states CHF then the urologist won't operate. Daughter would like to know were the CHF diag came from? she also would like to talk about bladder control meds and the possible side effects. DeShannon Smith CMA [facility] Dull)  October 26, 2009 3:04 PM   No CHF in our records but I wouldn't recommend surgery anyway will discuss at appt tomorrow Additional Follow-up by: Cindee Salt MD,  October 26, 2009 5:01 PM    Prescriptions: FENTANYL 25 MCG/HR PT72 (FENTANYL) apply 1 patch  every 3 days along with 12 micrograms patch  #10 x 0   Entered and Authorized by:   Cindee Salt MD   Signed by:   Cindee Salt MD on 10/26/2009   Method used:   Print then Give to Patient   RxID:   [id number]

## 2010-06-05 NOTE — Assessment & Plan Note (Signed)
Summary: ?UTI/CLE   Vital Signs:  Patient profile:   77 year old female Weight:      258 pounds Temp:     98.5 degrees F oral BP sitting:   140 / 80  (left arm) Cuff size:   large  Vitals Entered By: Mervin Hack CMA [facility] Dull) (March 19, 2010 12:25 PM) CC: UTI   History of Present Illness: Having continuous "debilitation of body" leg swelling, bladder dropped, sleep problems nothing is really new---just "pounding her"  Now with burning dysuria for 4 days taking symptomatic relief Last infection about 1 year ago No fever but has felt warm and cold at night No new back pain SOme nausea this AM, no vomiting  Allergies: 1)  ! Augmentin 2)  ! Cephalexin 3)  ! Ceftin 4)  ! Sulfa  Past History:  Past medical, surgical, family and social histories (including risk factors) reviewed for relevance to current acute and chronic problems.  Past Medical History: Reviewed history from 01/29/2010 and no changes required. 1. Hypertension 2. Hypothyroidism 3. Osteoporosis 4. Fibromyalgia 5. Chronic lower extremity edema.  6. Cervical disk disease 7. Follicular lymphoma: Quies[facility].  Dr Cherene Altes, Dr Elenore Rota 8. L frontal meningioma 9. GERD 10. Obesity 11. Cerebral hemorrhage: 2004, hypertensive.  12. H/o Urinary Tract Infection 13. Right renal artery aneurysm w/o stenosis, followed by Dr. Gilda Crease 14. Diastolic CHF: Echo (9/11) with EF 65-70%, mild LVH, mid AI, mild MR, PA systolic pressure > 40 mmHg.   Past Surgical History: Reviewed history from 12/22/2008 and no changes required. Cataract   L 10/05    R 4/07 [facility]) Hysterectomy--1958 Cerebral hemorrage--6/04 1985 cervical fusion 6/05  Tachycardia Left shoulder fracture--1990's Recticil/cysticil -1982  Family History: Reviewed history from 08/19/2006 and no changes required. Dad died @80  CVAs Mom died @91  myeloma Only child No CAD, DM HTN in family No cancer  Social History: Reviewed history from  01/01/2010 and no changes required. Retired--farmed/taught  Lives in [place] Married   3 sons/1 daughter Quit smoking 50 years ago Alcohol use-no  Review of Systems       appetite is off but no vomiting--has tried dramamine for settling stomach has some burping Bowels have been fairly regular with grape seed oil and stool softener  Physical Exam  General:  alert.  NAD Abdomen:  soft, non-tender, and no masses.   Msk:  No CVA tenderness   Impression & Recommendations:  Problem # 1:  UTI (ICD-599.0) Assessment Comment Only  significant pyuria but only vague, though protean systemic symptoms will send C&S empiric Rx with cipro  Her updated medication list for this problem includes:    Ciprofloxacin Hcl 250 Mg Tabs (Ciprofloxacin hcl) .Marland Kitchen... 1 tab by mouth two times a day for urinary infection  Orders: Specimen Handling (69629) T-Culture, Urine (52841-32440) UA Dipstick W/ Micro (manual) (81000)  Complete Medication List: 1)  Tekturna 300 Mg Tabs (Aliskiren fumarate) .... Take 1 tablet by mouth once a day 2)  Thyrolar-1/4 15 (3.1-12.5) Mg (mcg) Tabs (Liotrix (t3-t4)) .... Take 1 by mouth once daily 3)  Toprol Xl 200 Mg Tb24 (Metoprolol succinate) .... Take 1 tablet by mouth once daily 4)  Oxycodone Hcl 5 Mg Tabs (Oxycodone hcl) .Marland Kitchen.. 1 up to four times daily as needed for severe pain 5)  Klor-con M20 20 Meq Tbcr (Potassium chloride crys cr) .... Take 1  tablet by mouth every day 6)  Lisinopril 40 Mg Tabs (Lisinopril) .... Take 1 tablet by mouth once daily 7)  Fentanyl 25 Mcg/hr Pt72 (Fentanyl) .... Apply 1 patch every 3 days along with 12 micrograms patch 8)  Melatonin 3 Mg Caps (Melatonin) .... Take 1 capsule by mouth once daily 9)  Nystatin 100000 Unit/gm Crea (Nystatin) .... Apply topically to affected area three times daily as needed 10)  Vitamin E Complex 400 Unit Caps (Vitamin e) .... Take 1 tablet by mouth once daily 11)  Valerian Root 450 Mg Caps (Valerian)  .... Take 1 by mouth once daily 12)  Laxative Plus Stool Softener 30-100 Mg Caps (Casanthranol-docusate sodium) .... Take 1 tablet 4 times daily as needed 13)  Acidophilus Probiotic Blend Caps (Misc intestinal flora regulat) .... Take 1 tablet by mouth once daily 14)  Lutein-zeaxanthin 6-1 Mg Tabs (Lutein-zeaxanthin) .... Take 1 by mouth once daily 15)  Vitamin D3 5000 Unit/ml Liqd (Cholecalciferol) .... Take 1 by mouth once daily 16)  Torsemide 20 Mg Tabs (Torsemide) .... Take 1  tablets by mouth daily. 17)  Amlodipine Besylate 10 Mg Tabs (Amlodipine besylate) .... Take one tablet by mouth daily 18)  Ciprofloxacin Hcl 250 Mg Tabs (Ciprofloxacin hcl) .Marland Kitchen.. 1 tab by mouth two times a day for urinary infection  Patient Instructions: 1)  Please keep regular appt 2)  Call if the urine symptoms are not better by Wednesday Prescriptions: CIPROFLOXACIN HCL 250 MG TABS (CIPROFLOXACIN HCL) 1 tab by mouth two times a day for urinary infection  #20 x 0   Entered and Authorized by:   Cindee Salt MD   Signed by:   Cindee Salt MD on 03/19/2010   Method used:   Electronically to        [facility] (retail)       [address], [address]       [place], [place]  [address]       Ph: [phone]       Fax: [phone]   RxID:   [id number]    Orders Added: 1)  Specimen Handling [99000] 2)  T-Culture, Urine [[id number]] 3)  Est. Patient Level III [40347] 4)  UA Dipstick W/ Micro (manual) [81000]    Current Allergies (reviewed today): ! AUGMENTIN ! CEPHALEXIN ! CEFTIN ! SULFA  Laboratory Results   Urine Tests  Date/Time Received: March 19, 2010 12:26 PM Date/Time Reported: March 19, 2010 12:26 PM  Routine Urinalysis   Color: orange Appearance: Cloudy Glucose: negative   (Normal Range: Negative) Bilirubin: negative   (Normal Range: Negative) Ketone: negative   (Normal Range: Negative) Spec. Gravity: <1.005   (Normal  Range: 1.003-1.035) Blood: moderate   (Normal Range: Negative) pH: 6.5   (Normal Range: 5.0-8.0) Protein: negative   (Normal Range: Negative) Urobilinogen: 0.2   (Normal Range: 0-1) Nitrite: positive   (Normal Range: Negative) Leukocyte Esterace: small   (Normal Range: Negative)  Urine Microscopic WBC/HPF: packed RBC/HPF: occ Bacteria/HPF: 3+ Epithelial/HPF: occ    Comments: Pt took AZO, so urine was orange.

## 2010-06-05 NOTE — Assessment & Plan Note (Signed)
Summary: NP6/AMD   Visit Type:  Initial Consult Primary Provider:  Tillman Abide, MD  CC:  c/o swelling in legs and shortness of breath..  History of Present Illness: 77 yo with history of HTN, obesity, and chronic lower extremity edema presents for cardiology evaluation.  She has had leg swelling bilaterally for about 10 years.  She has been seen by vascular surgery in [place] and is thought to have a component of venous insufficiency. She has been short of breath after walking 30 feet for at least the last year.  She has been gaining weight.  She sleeps with 3 pillows at night, mainly for GERD rather than orthopnea.  No chest pain/tightness.  BP today is elevated at 180/97 (runs SBP 150s-160s typically at home).  She does have a history of a hypertensive cerebral hemorrhage in 2004.   ECG: NSR, 1st degree AV block, left axis deviation, poor anterior R wave progression  Current Medications (verified): 1)  Thyrolar-1/4 15 (3.1-12.5) Mg (Mcg) Tabs (Liotrix (T3-T4)) .... Take 1 By Mouth Once Daily 2)  Toprol Xl 200 Mg Tb24 (Metoprolol Succinate) .... Take 1 Tablet By Mouth Once Daily 3)  Oxycodone Hcl 5 Mg Tabs (Oxycodone Hcl) .Marland Kitchen.. 1 Up To Four Times Daily As Needed For Severe Pain 4)  Cardizem Cd 180 Mg  Cp24 (Diltiazem Hcl Coated Beads) .... Take 1 Tablet By Mouth Two Times A Day 5)  Klor-Con M20 20 Meq  Tbcr (Potassium Chloride Crys Cr) .... Take 1  Tablet By Mouth Every Day 6)  Lisinopril 40 Mg Tabs (Lisinopril) .... Take 1 Tablet By Mouth Once Daily 7)  Fentanyl 25 Mcg/hr Pt72 (Fentanyl) .... Apply 1 Patch Every 3 Days Along With 12 Micrograms Patch 8)  Melatonin 3 Mg Caps (Melatonin) .... Take 1 Capsule By Mouth Once Daily 9)  Nystatin 100000 Unit/gm  Crea (Nystatin) .... Apply Topically To Affected Area Three Times Daily As Needed 10)  Vitamin E Complex 400 Unit Caps (Vitamin E) .... Take 1 Tablet By Mouth Once Daily 11)  Valerian Root 450 Mg Caps (Valerian) .... Take 1 By Mouth Once  Daily 12)  Laxative Plus Stool Softener 30-100 Mg Caps (Casanthranol-Docusate Sodium) .... Take 1 Tablet 4 Times Daily As Needed 13)  Acidophilus Probiotic Blend  Caps (Misc Intestinal Flora Regulat) .... Take 1 Tablet By Mouth Once Daily 14)  Lutein-Zeaxanthin 6-1 Mg Tabs (Lutein-Zeaxanthin) .... Take 1 By Mouth Once Daily 15)  Vitamin D3 5000 Unit/ml Liqd (Cholecalciferol) .... Take 1 By Mouth Once Daily 16)  Torsemide 20 Mg Tabs (Torsemide) .... Take 2  Tablets By Mouth Daily.  Allergies (verified): 1)  ! Augmentin 2)  ! Cephalexin 3)  ! Ceftin 4)  ! Sulfa  Past History:  Past Surgical History: Last updated: 12/22/2008 Cataract   L 10/05    R 4/07 [facility]) Hysterectomy--1958 Cerebral hemorrage--6/04 1985 cervical fusion 6/05  Tachycardia Left shoulder fracture--1990's Recticil/cysticil -1982  Family History: Last updated: 09-05-2006 Dad died @80  CVAs Mom died @91  myeloma Only child No CAD, DM HTN in family No cancer  Social History: Last updated: 01/01/2010 Retired--farmed/taught  Lives in [place] Married   3 sons/1 daughter Quit smoking 50 years ago Alcohol use-no  Risk Factors: Smoking Status: never (09/05/2006)  Past Medical History: 1. Hypertension 2. Hypothyroidism 3. Osteoporosis 4. Fibromyalgia 5. Chronic lower extremity edema.  6. Cervical disk disease 7. Follicular lymphoma: Quies[facility].  Dr Cherene Altes, Dr Elenore Rota 8. L frontal meningioma 9. GERD 10. Obesity 11. Cerebral hemorrhage:  2004, hypertensive.  12. H/o Urinary Tract Infection 13. Right renal artery aneurysm w/o stenosis, followed by Dr. Gilda Crease  Family History: Reviewed history from 08/19/2006 and no changes required. Dad died @80  CVAs Mom died @91  myeloma Only child No CAD, DM HTN in family No cancer  Social History: Retired--farmed/taught  Lives in [place] Married   3 sons/1 daughter Quit smoking 50 years ago Alcohol use-no   Review of Systems       All  systems reviewed and negative except as per HPI.   Vital Signs:  Patient profile:   77 year old female Height:      62.5 inches Weight:      265 pounds BMI:     47.87 Pulse rate:   59 / minute BP sitting:   180 / 97  (left arm) Cuff size:   large  Vitals Entered By: Bishop Dublin, CMA (January 01, 2010 2:12 PM)  Physical Exam  General:  Well developed, well nourished, in no acute distress. Obese.  Head:  normocephalic and atraumatic Nose:  no deformity, discharge, inflammation, or lesions Mouth:  Teeth, gums and palate normal. Oral mucosa normal. Neck:  Neck supple, JVP 8-9 cm. No masses, thyromegaly or abnormal cervical nodes. Lungs:  Clear bilaterally to auscultation and percussion. Heart:  Non-displaced PMI, chest non-tender; regular rate and rhythm, S1, S2 without rubs or gallops. 2/6 systolic murmur RUSB.  Carotid upstroke normal, no bruit. 2+ chronic-appearing edema to the knees bilaterally.  Difficult to palpate pedal pulses due to edema.  Abdomen:  Bowel sounds positive; abdomen soft and non-tender without masses, organomegaly, or hernias noted. No hepatosplenomegaly. Extremities:  No clubbing or cyanosis. Neurologic:  Alert and oriented x 3. Skin:  Intact without lesions or rashes. Psych:  Normal affect.   Impression & Recommendations:  Problem # 1:  CHF (ICD-428.0) Patient has had chronic lower extremity edema for at least 3 years and has had NYHA class III dyspnea (short of breath after walking 30 feet) for at least a year.  Obesity/deconditioning certainly play a role in the dyspnea.  She also likely has venous insufficiency as a contributor to the lower extremity edema.  Diltiazem (she has been on this for years) could contribute to the lower extremity edema.  She does appear to have mildly elevated neck veins so there may be a component of probably diastolic CHF.  Of note, she has not tolerated compression stockings in the past.  - Echocardiogram to assess LV function  and PA pressure.  - BNP today - Patient takes torsemide 20 mg as needed.  I have asked her to take torsemide 20 mg daily.  - Stop diltiazem.   Problem # 2:  HYPERTENSION (ICD-401.9) BP is quite high today and runs high at home.  She is on maximal ACEI and HR is in the 50s on Toprol XL and diltiazem CD.  I will have her stop diltiazem and replace it with aliskiren 150 mg daily.  Will need BMET in 2 wks.  BP check in 2 weeks.  Next step will likely be changing beta blocker to Coreg for better BP control.   Will check lipids.   Other Orders: EKG w/ Interpretation (93000) T-BNP  (B Natriuretic Peptide) (52841-32440) Echocardiogram (Echo)  Patient Instructions: 1)  Your physician recommends that you schedule a follow-up appointment in: 1 month 2)  Your physician recommends that you have lab work today (BNP) 3)  Your physician has recommended you make the following change in your  medication: STOP diltiazem START torsemide daily and tekturna daily.  4)  Your physician has requested that you have an echocardiogram.  Echocardiography is a painless test that uses sound waves to create images of your heart. It provides your doctor with information about the size and shape of your heart and how well your heart's chambers and valves are working.  This procedure takes approximately one hour. There are no restrictions for this procedure. 5)  Your physician has requested that you regularly monitor and record your blood pressure readings at home.  Please use the same machine at the same time of day to check your readings and record them and call the Nurse with your results in 2 weeks.  6)  Your physician recommends that you return for a FASTING lipid profile: in 2 weeks. (BMP/LIP/LFT) Prescriptions: TEKTURNA 150 MG TABS (ALISKIREN FUMARATE) Take 1 tablet by mouth once a day  #30 x 4   Entered by:   Benedict Needy, RN   Authorized by:   Marca Ancona, MD   Signed by:   Benedict Needy, RN on 01/01/2010    Method used:   Electronically to        [facility] [address] (retail)       [address], [address]       [place], [place]  [address]       Ph: [phone]       Fax: [phone]   RxID:   [id number] TORSEMIDE 20 MG TABS (TORSEMIDE) Take 1  tablets by mouth daily.  #30 x 3   Entered by:   Benedict Needy, RN   Authorized by:   Marca Ancona, MD   Signed by:   Benedict Needy, RN on 01/01/2010   Method used:   Electronically to        [facility] [address] (retail)       [address]       [place], [place]  [address]       Ph: [phone]       Fax: [phone]   RxID:   [id number]

## 2010-06-05 NOTE — Progress Notes (Signed)
Summary: fentanyl   Phone Note Refill Request Call back at Work Phone [phone] Message from:  Patient on November 22, 2009 8:53 AM  Refills Requested: Medication #1:  FENTANYL 25 MCG/HR PT72 apply 1 patch every 3 days along with 12 micrograms patch  Method Requested: Pick up at Office Initial call taken by: Melody Comas,  November 22, 2009 8:53 AM  Follow-up for Phone Call        Rx written Follow-up by: Cindee Salt MD,  November 22, 2009 11:55 AM  Additional Follow-up for Phone Call Additional follow up Details #1::        Patient notified that rx is up front and ready for pickup. Additional Follow-up by: Sydell Axon LPN,  November 22, 2009 1:12 PM    Prescriptions: FENTANYL 25 MCG/HR PT72 (FENTANYL) apply 1 patch every 3 days along with 12 micrograms patch  #10 x 0   Entered and Authorized by:   Cindee Salt MD   Signed by:   Cindee Salt MD on 11/22/2009   Method used:   Print then Give to Patient   RxID:   [id number]

## 2010-06-05 NOTE — Progress Notes (Signed)
Summary: Thyrolar  Phone Note Call from Patient   Caller: Daughter, [name] Call For: Cindee Salt MD Summary of Call: pt's daughter would like a rx written for Thyrolar to be make for pt, of T-4 and of T-3, please fax to [facility] at fax# [phone] Initial call taken by: Mervin Hack CMA Duncan Dull),  Sep 25, 2009 4:11 PM  Follow-up for Phone Call        Rx written Follow-up by: Cindee Salt MD,  Sep 25, 2009 5:56 PM  Additional Follow-up for Phone Call Additional follow up Details #1::        RX faxed to [facility], spoke with daughter and advised rx was faxed. Additional Follow-up by: Mervin Hack CMA Duncan Dull),  Sep 26, 2009 9:29 AM    New/Updated Medications: THYROLAR-1/4 15 (3.1-12.5) MG (MCG) TABS (LIOTRIX (T3-T4))  THYROLAR-1/4 15 (3.1-12.5) MG (MCG) TABS (LIOTRIX (T3-T4)) take 1 by mouth once daily Prescriptions: THYROLAR-1/4 15 (3.1-12.5) MG (MCG) TABS (LIOTRIX (T3-T4)) take 1 by mouth once daily  #100 x 0   Entered by:   Mervin Hack CMA (AAMA)   Authorized by:   Cindee Salt MD   Signed by:   Mervin Hack CMA (AAMA) on 09/26/2009   Method used:   Faxed to ...       [facility] (retail)             , [place]         Ph:        Fax: [phone]   RxID:   [id number]

## 2010-06-05 NOTE — Assessment & Plan Note (Signed)
Summary: PROCEDURE F-UP/YF   History of Present Illness Visit Type: Follow-up Visit Primary GI MD: Melvia Heaps MD [facility] Primary Provider: Tillman Abide, MD Requesting Provider: Tillman Abide, MD Chief Complaint: F/U after procedure [name] has returned for followup of her upper abdominal discomfort.  Endoscopy on September 14 demonstrated benign gastric nodules and a distal esophagitis.  On Prilosec her symptoms have subsided.  She initially took hyomax but  has not taken it for several weeks.  She has no GI complaints at this time.   GI Review of Systems      Denies abdominal pain, acid reflux, belching, bloating, chest pain, dysphagia with liquids, dysphagia with solids, heartburn, loss of appetite, nausea, vomiting, vomiting blood, weight loss, and  weight gain.        Denies anal fissure, black tarry stools, change in bowel habit, constipation, diarrhea, diverticulosis, fecal incontinence, heme positive stool, hemorrhoids, irritable bowel syndrome, jaundice, light color stool, liver problems, rectal bleeding, and  rectal pain.    Current Medications (verified): 1)  Toprol Xl 200 Mg Tb24 (Metoprolol Succinate) .... Take 1 Tablet By Mouth Once Daily 2)  Demadex 20 Mg Tabs (Torsemide) .... Take 1 Tablet By Mouth Two Times A Day 3)  Oxycodone Hcl 5 Mg Tabs (Oxycodone Hcl) .Marland Kitchen.. 1 Up To Four Times Daily As Needed For Severe Pain 4)  Thyrolar-1 60 Mg  Tabs (Liotrix) .Marland Kitchen.. 1 Daily 5)  Cardizem Cd 180 Mg  Cp24 (Diltiazem Hcl Coated Beads) .... Take 1 Tablet By Mouth Two Times A Day 6)  Fentanyl 25 Mcg/hr  Pt72 (Fentanyl) .... Apply 1 Patch Every 3 Days 7)  Klor-Con M20 20 Meq  Tbcr (Potassium Chloride Crys Cr) .... Take 1  Tablet By Mouth Every Day 8)  Nystatin 100000 Unit/gm  Crea (Nystatin) .... Apply Topically To Affected Area Three Times Daily As Needed 9)  Vitamin E Complex 400 Unit Caps (Vitamin E) .... Take 1 Tablet By Mouth Once Daily 10)  Valerian Root 450 Mg Caps (Valerian)  .... Take 1 By Mouth Once Daily 11)  Laxative Plus Stool Softener 30-100 Mg Caps (Casanthranol-Docusate Sodium) .... Take 1 Tablet 4 Times Daily As Needed 12)  Acidophilus Probiotic Blend  Caps (Misc Intestinal Flora Regulat) .... Take 1 Tablet By Mouth Once Daily 13)  Melatonin 3 Mg Caps (Melatonin) .... Take 1 Capsule By Mouth Once Daily 14)  Lisinopril 40 Mg Tabs (Lisinopril) .... Take 1 Tablet By Mouth Once Daily 15)  Diaper Rash Cream Otc .... As Needed 16)  Zyrtec Allergy 10 Mg Tabs (Cetirizine Hcl) .... Take 1 Tablet By Mouth Once A Day 17)  Vitamin D3 5000 Unit/ml Liqd (Cholecalciferol) .... Take 1 By Mouth Once Daily 18)  Hyomax-Sr 0.375 Mg Xr12h-Tab (Hyoscyamine Sulfate) .... Take One Tab Twice A Day As Needed 19)  Prilosec 20 Mg Cpdr (Omeprazole) .... Once Daily 20)  Cipro 250 Mg Tabs (Ciprofloxacin Hcl) .Marland Kitchen.. 1 Tab By Mouth Two Times A Day X7 Days  Allergies: 1)  ! Augmentin 2)  ! Cephalexin 3)  ! Ceftin 4)  ! Sulfa  Past History:  Past Medical History: Reviewed history from 01/20/2009 and no changes required. Hypertension Hypothyroidism Osteoporosis Fibromyalgia Edema Cervical disk disease Follicular lymphoma-------------------------------Dr Cherene Altes, Dr Elenore Rota L frontal meningioma GERD Obesity Stroke Urinary Tract Infection  Past Surgical History: Reviewed history from 12/22/2008 and no changes required. Cataract   L 10/05    R 4/07 [facility]) Hysterectomy--1958 Cerebral hemorrage--6/04 1985 cervical fusion 6/05  Tachycardia Left shoulder  fracture--1990's Recticil/cysticil -1982  Family History: Reviewed history from 08/19/2006 and no changes required. Dad died @80  CVAs Mom died @91  myeloma Only child No CAD, DM HTN in family No cancer  Social History: Reviewed history from 02/22/2008 and no changes required. Retired--farmed/taught school/homemaker Married   3 sons/1 daughter Quit smoking 50 years ago Alcohol use-no  Review of Systems        The patient complains of allergy/sinus, anxiety-new, arthritis/joint pain, back pain, fatigue, headaches-new, sleeping problems, swelling of feet/legs, and urination - excessive.    Vital Signs:  Patient profile:   77 year old female Height:      62.5 inches Weight:      260 pounds Pulse rate:   68 / minute Pulse rhythm:   regular BP sitting:   124 / 76  (left arm) Cuff size:   large  Vitals Entered By: June McMurray CMA Duncan Dull) (February 22, 2009 2:29 PM)   Impression & Recommendations:  Problem # 1:  REFLUX ESOPHAGITIS (ICD-530.11) Assessment Improved Plan to continue Prilosec as needed.

## 2010-06-05 NOTE — Progress Notes (Signed)
Summary: fentanyl,oxycodone  Phone Note Refill Request Call back at Work Phone [phone] Message from:  Patient on March 26, 2010 10:52 AM  Refills Requested: Medication #1:  OXYCODONE HCL 5 MG TABS 1 up to four times daily as needed for severe pain  Medication #2:  FENTANYL 25 MCG/HR PT72 apply 1 patch every 3 days along with 12 micrograms patch Please call [name] when ready.    Method Requested: Pick up at Office Initial call taken by: Melody Comas,  March 26, 2010 10:52 AM  Follow-up for Phone Call        Rx written Follow-up by: Cindee Salt MD,  March 26, 2010 1:45 PM  Additional Follow-up for Phone Call Additional follow up Details #1::        Spoke with patient's daughter and advised results.  Additional Follow-up by: Mervin Hack CMA Duncan Dull),  March 26, 2010 3:02 PM    New/Updated Medications: OXYCODONE HCL 5 MG TABS (OXYCODONE HCL) 1 up to four times daily as needed for severe pain FENTANYL 25 MCG/HR PT72 (FENTANYL) apply 1 patch every 3 days along with 12 micrograms patch Prescriptions: FENTANYL 25 MCG/HR PT72 (FENTANYL) apply 1 patch every 3 days along with 12 micrograms patch  #10 x 0   Entered and Authorized by:   Cindee Salt MD   Signed by:   Cindee Salt MD on 03/26/2010   Method used:   Print then Give to Patient   RxID:   [id number] OXYCODONE HCL 5 MG TABS (OXYCODONE HCL) 1 up to four times daily as needed for severe pain  #120 x 0   Entered and Authorized by:   Cindee Salt MD   Signed by:   Cindee Salt MD on 03/26/2010   Method used:   Print then Give to Patient   RxID:   [id number]

## 2010-06-05 NOTE — Progress Notes (Signed)
Summary: MONITOR  Phone Note Call from Patient Call back at Home Phone [phone]   Caller: Daughter Call For: [facility] Summary of Call: PT IS GOING TO CALL AND RESCHEDULE THE MONITOR-THERE HAS BEEN A DEATH IN THE FAMILY Initial call taken by: Harlon Flor,  March 12, 2010 11:40 AM  Follow-up for Phone Call        Called spoke pt she has already received event monitor, but daughter and husband have gone out of town for death in the family and pt is unable to set up monitor herself.  They will return on Wednesday and she will decide then when she is going to start wearing. Follow-up by: Cloyde Reams RN,  March 12, 2010 11:53 AM

## 2010-06-05 NOTE — Assessment & Plan Note (Signed)
Summary: 2:00 ACUTE PER DEE   Vital Signs:  Patient profile:   77 year old female Weight:      264 pounds BMI:     47.69 O2 Sat:      91 % on Room air Temp:     98.7 degrees F oral Pulse rate:   62 / minute Pulse rhythm:   regular BP sitting:   140 / 80  (left arm) Cuff size:   large  Vitals Entered By: Mervin Hack CMA Duncan Dull) (October 27, 2009 2:11 PM)  O2 Flow:  Room air CC: UTI?   History of Present Illness: Has burning sensation at end of void has tender place on outside that hurts some help with vaseline or zinc oxide remembers similar pain with past UTIs but "more inside"  Has had increased freq Notes urgency as well has needed pads due to incontinence--this is variable  No fever   Allergies: 1)  ! Augmentin 2)  ! Cephalexin 3)  ! Ceftin 4)  ! Sulfa  Past History:  Past medical, surgical, family and social histories (including risk factors) reviewed for relevance to current acute and chronic problems.  Past Medical History: Reviewed history from 01/20/2009 and no changes required. Hypertension Hypothyroidism Osteoporosis Fibromyalgia Edema Cervical disk disease Follicular lymphoma-------------------------------Dr Cherene Altes, Dr Elenore Rota L frontal meningioma GERD Obesity Stroke Urinary Tract Infection  Past Surgical History: Reviewed history from 12/22/2008 and no changes required. Cataract   L 10/05    R 4/07 [facility]) Hysterectomy--1958 Cerebral hemorrage--6/04 1985 cervical fusion 6/05  Tachycardia Left shoulder fracture--1990's Recticil/cysticil -1982  Family History: Reviewed history from 08/19/2006 and no changes required. Dad died @80  CVAs Mom died @91  myeloma Only child No CAD, DM HTN in family No cancer  Social History: Reviewed history from 02/22/2008 and no changes required. Retired--farmed/taught school/homemaker Married   3 sons/1 daughter Quit smoking 50 years ago Alcohol use-no  Review of Systems       mild  reflux symptoms but no sig abd pain no nausea occ takes dramamine bowels okay with stool softeners appetite okay  Physical Exam  General:  alert and normal appearance.   Abdomen:  soft and non-tender.   Genitalia:  redundant labial folds no cysts or lesions mild left labia majora irritation but no lesions or induration introitus is normal Msk:  no CVA tenderness   Impression & Recommendations:  Problem # 1:  UTI (ICD-599.0) Assessment New  having ongoing symptoms will send culture empiric Rx with cipro  Her updated medication list for this problem includes:    Ciprofloxacin Hcl 250 Mg Tabs (Ciprofloxacin hcl) .Marland Kitchen... 1 tab by mouth two times a day for urinary tract infection  Orders: UA Dipstick W/ Micro (manual) (95638)  Problem # 2:  RASH AND OTHER NONSPECIFIC SKIN ERUPTION (ICD-782.1) Assessment: New  mild vulvar irritation related to the UTI discussed topical care still  Orders: Specimen Handling (75643) T-Culture, Urine ([id number])  Complete Medication List: 1)  Thyrolar-1/4 15 (3.1-12.5) Mg (mcg) Tabs (Liotrix (t3-t4)) .... Take 1 by mouth once daily 2)  Toprol Xl 200 Mg Tb24 (Metoprolol succinate) .... Take 1 tablet by mouth once daily 3)  Oxycodone Hcl 5 Mg Tabs (Oxycodone hcl) .Marland Kitchen.. 1 up to four times daily as needed for severe pain 4)  Cardizem Cd 180 Mg Cp24 (Diltiazem hcl coated beads) .... Take 1 tablet by mouth two times a day 5)  Klor-con M20 20 Meq Tbcr (Potassium chloride crys cr) .... Take 1  tablet by mouth  every day 6)  Lisinopril 40 Mg Tabs (Lisinopril) .... Take 1 tablet by mouth once daily 7)  Fentanyl 25 Mcg/hr Pt72 (Fentanyl) .... Apply 1 patch every 3 days along with 12 micrograms patch 8)  Melatonin 3 Mg Caps (Melatonin) .... Take 1 capsule by mouth once daily 9)  Nystatin 100000 Unit/gm Crea (Nystatin) .... Apply topically to affected area three times daily as needed 10)  Vitamin E Complex 400 Unit Caps (Vitamin e) .... Take 1 tablet by  mouth once daily 11)  Valerian Root 450 Mg Caps (Valerian) .... Take 1 by mouth once daily 12)  Laxative Plus Stool Softener 30-100 Mg Caps (Casanthranol-docusate sodium) .... Take 1 tablet 4 times daily as needed 13)  Acidophilus Probiotic Blend Caps (Misc intestinal flora regulat) .... Take 1 tablet by mouth once daily 14)  Lutein-zeaxanthin 6-1 Mg Tabs (Lutein-zeaxanthin) .... Take 1 by mouth once daily 15)  Vitamin D3 5000 Unit/ml Liqd (Cholecalciferol) .... Take 1 by mouth once daily 16)  Ciprofloxacin Hcl 250 Mg Tabs (Ciprofloxacin hcl) .Marland Kitchen.. 1 tab by mouth two times a day for urinary tract infection  Patient Instructions: 1)  Please take the cipro for urinary infection 2)  Keep regular appt Prescriptions: CIPROFLOXACIN HCL 250 MG TABS (CIPROFLOXACIN HCL) 1 tab by mouth two times a day for urinary tract infection  #20 x 0   Entered and Authorized by:   Cindee Salt MD   Signed by:   Cindee Salt MD on 10/27/2009   Method used:   Electronically to        [facility] [address] (retail)       [address], [address]       [place], [place]  [address]       Ph: [phone]       Fax: [phone]   RxID:   [id number]   Current Allergies (reviewed today): ! AUGMENTIN ! CEPHALEXIN ! CEFTIN ! SULFA Laboratory Results   Urine Tests  Date/Time Received: October 27, 2009 2:16 PM Date/Time Reported: October 27, 2009 2:16 PM  Routine Urinalysis   Color: orange Appearance: Cloudy Glucose: negative   (Normal Range: Negative) Bilirubin: negative   (Normal Range: Negative) Ketone: negative   (Normal Range: Negative) Spec. Gravity: 1.015   (Normal Range: 1.003-1.035) Blood: moderate   (Normal Range: Negative) pH: 7.0   (Normal Range: 5.0-8.0) Protein: 30   (Normal Range: Negative) Urobilinogen: 0.2   (Normal Range: 0-1) Nitrite: positive   (Normal Range: Negative) Leukocyte Esterace: moderate   (Normal Range: Negative)  Urine  Microscopic WBC/HPF: TNTC RBC/HPF: occ Bacteria/HPF: occ Epithelial/HPF: rare

## 2010-06-05 NOTE — Medication Information (Signed)
Summary: Kayla [name]   Imported By: Lanelle Bal 10/04/2009 09:33:13  _____________________________________________________________________  External Attachment:    Type:   Image     Comment:   External Document

## 2010-06-05 NOTE — Assessment & Plan Note (Signed)
Summary: F1M/AMD   Visit Type:  Follow-up Primary Provider:  Tillman Abide, MD   History of Present Illness: 77 yo with history of HTN, obesity, and chronic lower extremity edema presents for cardiology followup.  She has had leg swelling bilaterally for about 10 years.  She has been seen by vascular surgery in [place] and is thought to have a component of venous insufficiency. She cannot wear compression stockings (unable to put them on).  She has been short of breath after walking 30 feet for at least the last year.  She sleeps with 3 pillows at night, mainly for GERD rather than orthopnea.  No chest pain/tightness.  BP is now improved, running 130s-153 systolic on home readings.  It is 151/91 here today.  Patient is drowsy during the day and falls asleep easily.  She has seen Dr. Shelle Iron and is doing a home sleep study.   Labs (8/11): BNP 50 Labs (9/11): LDL 133, HDL 53, K 4.0, creatinine 0.8, LFTs normal, TSH normal Labs (10/11): K 4.1, creatinine 0.78  Current Medications (verified): 1)  Tekturna 300 Mg Tabs (Aliskiren Fumarate) .... Take 1 Tablet By Mouth Once A Day 2)  Thyrolar-1/4 15 (3.1-12.5) Mg (Mcg) Tabs (Liotrix (T3-T4)) .... Take 1 By Mouth Once Daily 3)  Toprol Xl 200 Mg Tb24 (Metoprolol Succinate) .... Take 1 Tablet By Mouth Once Daily 4)  Oxycodone Hcl 5 Mg Tabs (Oxycodone Hcl) .Marland Kitchen.. 1 Up To Four Times Daily As Needed For Severe Pain 5)  Klor-Con M20 20 Meq  Tbcr (Potassium Chloride Crys Cr) .... Take 1  Tablet By Mouth Every Day 6)  Lisinopril 40 Mg Tabs (Lisinopril) .... Take 1 Tablet By Mouth Once Daily 7)  Fentanyl 25 Mcg/hr Pt72 (Fentanyl) .... Apply 1 Patch Every 3 Days Along With 12 Micrograms Patch 8)  Melatonin 3 Mg Caps (Melatonin) .... Take 1 Capsule By Mouth Once Daily 9)  Nystatin 100000 Unit/gm  Crea (Nystatin) .... Apply Topically To Affected Area Three Times Daily As Needed 10)  Vitamin E Complex 400 Unit Caps (Vitamin E) .... Take 1 Tablet By Mouth Once  Daily 11)  Valerian Root 450 Mg Caps (Valerian) .... Take 1 By Mouth Once Daily 12)  Laxative Plus Stool Softener 30-100 Mg Caps (Casanthranol-Docusate Sodium) .... Take 1 Tablet 4 Times Daily As Needed 13)  Acidophilus Probiotic Blend  Caps (Misc Intestinal Flora Regulat) .... Take 1 Tablet By Mouth Once Daily 14)  Lutein-Zeaxanthin 6-1 Mg Tabs (Lutein-Zeaxanthin) .... Take 1 By Mouth Once Daily 15)  Vitamin D3 5000 Unit/ml Liqd (Cholecalciferol) .... Take 1 By Mouth Once Daily 16)  Torsemide 20 Mg Tabs (Torsemide) .... Take 1  Tablets By Mouth Daily. 17)  Amlodipine Besylate 10 Mg Tabs (Amlodipine Besylate) .... Take One Tablet By Mouth Daily  Allergies (verified): 1)  ! Augmentin 2)  ! Cephalexin 3)  ! Ceftin 4)  ! Sulfa  Past History:  Past Surgical History: Last updated: 12/22/2008 Cataract   L 10/05    R 4/07 [facility]) Hysterectomy--1958 Cerebral hemorrage--6/04 1985 cervical fusion 6/05  Tachycardia Left shoulder fracture--1990's Recticil/cysticil -1982  Family History: Last updated: 11-Sep-2006 Dad died @80  CVAs Mom died @91  myeloma Only child No CAD, DM HTN in family No cancer  Social History: Last updated: 01/01/2010 Retired--farmed/taught  Lives in [place] Married   3 sons/1 daughter Quit smoking 50 years ago Alcohol use-no  Risk Factors: Smoking Status: never (11-Sep-2006)  Past Medical History: Reviewed history from 01/29/2010 and no changes required. 1.  Hypertension 2. Hypothyroidism 3. Osteoporosis 4. Fibromyalgia 5. Chronic lower extremity edema.  6. Cervical disk disease 7. Follicular lymphoma: Quiescent.  Dr Cherene Altes, Dr Elenore Rota 8. L frontal meningioma 9. GERD 10. Obesity 11. Cerebral hemorrhage: 2004, hypertensive.  12. H/o Urinary Tract Infection 13. Right renal artery aneurysm w/o stenosis, followed by Dr. Gilda Crease 14. Diastolic CHF: Echo (9/11) with EF 65-70%, mild LVH, mid AI, mild MR, PA systolic pressure > 40 mmHg.    Family History: Reviewed history from 08/19/2006 and no changes required. Dad died @80  CVAs Mom died @91  myeloma Only child No CAD, DM HTN in family No cancer  Social History: Reviewed history from 01/01/2010 and no changes required. Retired--farmed/taught  Lives in [place] Married   3 sons/1 daughter Quit smoking 50 years ago Alcohol use-no  Vital Signs:  Patient profile:   77 year old female Height:      62.5 inches Weight:      267 pounds BMI:     48.23 Pulse rate:   66 / minute BP sitting:   151 / 91  (left arm) Cuff size:   large  Vitals Entered By: Bishop Dublin, CMA (February 26, 2010 2:10 PM)  Physical Exam  General:  Well developed, well nourished, in no acute distress. Obesity.  Neck:  Neck thick, JVP 7-8 cm. No masses, thyromegaly or abnormal cervical nodes. Lungs:  Clear bilaterally to auscultation and percussion. Heart:  Non-displaced PMI, chest non-tender; regular rate and rhythm, S1, S2 without rubs or gallops. 2/6 SEM RUSB.  Carotid upstroke normal, no bruit.  Pedals normal pulses. 2+ edema to thighs bilaterally. Abdomen:  Bowel sounds positive; abdomen soft and non-tender without masses, organomegaly, or hernias noted. No hepatosplenomegaly. Extremities:  No clubbing or cyanosis. Neurologic:  Alert and oriented x 3. Psych:  Normal affect.   Impression & Recommendations:  Problem # 1:  HYPERTENSION (ICD-401.9) BP is reasonably controlled (much better than prior) on current regimen.  Continue current regimen.  I would also like her to get a sleep study to assess for OSA (could certainly affect BP) - she is getting a home sleep study (has seen Dr. Shelle Iron).  She needs to lose weight.  I talked to her about eating smaller portions and trying to walk more for exercise.   Problem # 2:  CHF (ICD-428.0) Diastolic CHF, likely related to HTN.  Given the mild pulmonary hypertension, there may be a component of OSA as well.  She is stable symptomatically.   She feels like she is unable to increase her torsemide due to urinary incontinence.  She has not tolerated compression stockings.  A large part of her lower extremity edema, I suspect, is due to venous insufficiency.  She needs to avoid sodium better in her diet.  Problem # 3:  CHF (ICD-428.0)

## 2010-06-05 NOTE — Progress Notes (Signed)
Summary: BP check  Phone Note Call from Patient   Caller: Patient Call For: Mclean Summary of Call: Pt's BP check after increasing tekturna and starting amolodipine.  155/86-66, 128/75-72, 154/77-66, 143/81-64, 143/84-66, 156/81-62 Please advise.  Initial call taken by: Benedict Needy, RN,  February 12, 2010 3:12 PM     Appended Document: BP check increase Norvasc to 10 mg daily.   Appended Document: BP check Pt aware of medication changes.    Clinical Lists Changes  Medications: Changed medication from AMLODIPINE BESYLATE 5 MG TABS (AMLODIPINE BESYLATE) Take one tablet by mouth daily to AMLODIPINE BESYLATE 10 MG TABS (AMLODIPINE BESYLATE) Take one tablet by mouth daily - Signed Rx of AMLODIPINE BESYLATE 10 MG TABS (AMLODIPINE BESYLATE) Take one tablet by mouth daily;  #30 x 6;  Signed;  Entered by: Benedict Needy, RN;  Authorized by: Marca Ancona, MD;  Method used: Electronically to [facility]  [phone], [place], [place], [place]  [address], Ph: [phone], Fax: [phone]    Prescriptions: AMLODIPINE BESYLATE 10 MG TABS (AMLODIPINE BESYLATE) Take one tablet by mouth daily  #30 x 6   Entered by:   Benedict Needy, RN   Authorized by:   Marca Ancona, MD   Signed by:   Benedict Needy, RN on 02/14/2010   Method used:   Electronically to        [facility]  [address] (retail)       [address], [address]       [place], [place]  [address]       Ph: [phone]       Fax: [phone]   RxID:   [id number]

## 2010-06-05 NOTE — Progress Notes (Signed)
Summary: Patient's At Home Vitals   Patient's At Home Vitals   Imported By: Roderic Ovens 03/08/2010 11:51:25  _____________________________________________________________________  External Attachment:    Type:   Image     Comment:   External Document

## 2010-06-05 NOTE — Letter (Signed)
Summary: [facility]  [facility]   Imported By: Lanelle Bal 06/30/2009 08:54:10  _____________________________________________________________________  External Attachment:    Type:   Image     Comment:   External Document  Appended Document: [facility] stable renal artery aneurysm follow up 6 months

## 2010-06-07 NOTE — Assessment & Plan Note (Signed)
Summary: 4 m f/u dlo  R/S FROM 05/09/10   Vital Signs:  Patient profile:   77 year old female Weight:      245 pounds Temp:     98.4 degrees F oral Pulse rate:   60 / minute Pulse rhythm:   regular BP sitting:   148 / 80  (left arm) Cuff size:   large  Vitals Entered By: Mervin Hack CMA Duncan Dull) (May 14, 2010 12:04 PM) CC: 4 month follow-up   History of Present Illness: Here with daughter as usual  Notes "continuation of little things, like the incontinence" Has to use 10-12 pads per day  No new concerns  Heart has been fine No chest pain No SOB Edema may be some better Right>leg leg does improve in bed tries to avoid the fluid pill Does watch and limit salt in her diet  Satisfied in general with pain control still gets neck pain that radiates into shoulders uses neck roll uses up to 4 oxycodone daily---and some tylenol at times  Mood has been okay gets frustrated with health at times--esp the incontinence  Allergies: 1)  ! Augmentin 2)  ! Cephalexin 3)  ! Ceftin 4)  ! Sulfa  Past History:  Past medical, surgical, family and social histories (including risk factors) reviewed for relevance to current acute and chronic problems.  Past Medical History: Reviewed history from 01/29/2010 and no changes required. 1. Hypertension 2. Hypothyroidism 3. Osteoporosis 4. Fibromyalgia 5. Chronic lower extremity edema.  6. Cervical disk disease 7. Follicular lymphoma: Quiescent.  Dr Cherene Altes, Dr Elenore Rota 8. L frontal meningioma 9. GERD 10. Obesity 11. Cerebral hemorrhage: 2004, hypertensive.  12. H/o Urinary Tract Infection 13. Right renal artery aneurysm w/o stenosis, followed by Dr. Gilda Crease 14. Diastolic CHF: Echo (9/11) with EF 65-70%, mild LVH, mid AI, mild MR, PA systolic pressure > 40 mmHg.   Past Surgical History: Reviewed history from 12/22/2008 and no changes required. Cataract   L 10/05    R 4/07 [facility]) Hysterectomy--1958 Cerebral  hemorrage--6/04 1985 cervical fusion 6/05  Tachycardia Left shoulder fracture--1990's Recticil/cysticil -1982  Family History: Reviewed history from 08/19/2006 and no changes required. Dad died @80  CVAs Mom died @91  myeloma Only child No CAD, DM HTN in family No cancer  Social History: Reviewed history from 01/01/2010 and no changes required. 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  NAD Neck:  supple, no masses, and no cervical lymphadenopathy.   Lungs:  normal respiratory effort, no intercostal retractions, no accessory muscle use, and normal breath sounds.   Heart:  normal rate, regular rhythm, no murmur, and no gallop.   Abdomen:  soft and non-tender.   Extremities:  3-4+ edema without pitting in calves Psych:  normally interactive, good eye contact, not anxious appearing, and not depressed appearing.     Impression & Recommendations:  Problem # 1:  INCONTINENCE (ICD-788.30) Assessment Unchanged her most troubling issue still prefers the pads to meds and high risk for reactions with the anticholinergics  Problem # 2:  HYPERTENSION (ICD-401.9) Assessment: Unchanged reasonable control no changes needed  Her updated medication list for this problem includes:    Tekturna 300 Mg Tabs (Aliskiren fumarate) .Marland Kitchen... Take 1 tablet by mouth once a day  Toprol Xl 200 Mg Tb24 (Metoprolol succinate) .Marland Kitchen... Take 1 tablet by mouth once daily    Lisinopril 40 Mg Tabs (Lisinopril) .Marland Kitchen... Take 1 tablet by mouth once daily    Torsemide 20 Mg Tabs (Torsemide) .Marland Kitchen... Take 1  tablets by mouth daily.    Amlodipine Besylate 10 Mg Tabs (Amlodipine besylate) .Marland Kitchen... Take one tablet by mouth daily  BP today: 148/80 Prior BP: 150/80 (04/16/2010)  Labs Reviewed: K+: 4.1  (02/12/2010) Creat: : 0.78 (02/12/2010)   Chol: 210 (01/15/2010)   HDL: 53 (01/15/2010)   LDL: 133 (01/15/2010)   TG: 121 (01/15/2010)  Problem # 3:  HYPOTHYROIDISM (ICD-244.9)  Her updated medication list for this problem includes:    Thyrolar-1/4 15 (3.1-12.5) Mg (mcg) Tabs (Liotrix (t3-t4)) .Marland Kitchen... Take 1 by mouth once daily  Labs Reviewed: TSH: 1.05 (01/11/2010)    HgBA1c: 6.2 (09/19/2008) Chol: 210 (01/15/2010)   HDL: 53 (01/15/2010)   LDL: 133 (01/15/2010)   TG: 121 (01/15/2010)  Problem # 4:  CHRONIC DIASTOLIC HEART FAILURE (ICD-428.32) Assessment: Comment Only stable resp and (limited) functional status  Her updated medication list for this problem includes:    Toprol Xl 200 Mg Tb24 (Metoprolol succinate) .Marland Kitchen... Take 1 tablet by mouth once daily    Lisinopril 40 Mg Tabs (Lisinopril) .Marland Kitchen... Take 1 tablet by mouth once daily    Torsemide 20 Mg Tabs (Torsemide) .Marland Kitchen... Take 1  tablets by mouth daily.  Problem # 5:  DISC DISEASE, CERVICAL (ICD-722.4) Assessment: Unchanged reasonable pain control'  Complete Medication List: 1)  Tekturna 300 Mg Tabs (Aliskiren fumarate) .... Take 1 tablet by mouth once a day 2)  Thyrolar-1/4 15 (3.1-12.5) Mg (mcg) Tabs (Liotrix (t3-t4)) .... Take 1 by mouth once daily 3)  Toprol Xl 200 Mg Tb24 (Metoprolol succinate) .... Take 1 tablet by mouth once daily 4)  Oxycodone Hcl 5 Mg Tabs (Oxycodone hcl) .Marland Kitchen.. 1 up to four times daily as needed for severe pain please fill after 12/21 5)  Klor-con M20 20 Meq Tbcr (Potassium chloride crys cr) .... Take 1  tablet by mouth every day 6)  Lisinopril 40 Mg Tabs (Lisinopril) .... Take 1 tablet by mouth once daily 7)  Fentanyl 25 Mcg/hr Pt72 (Fentanyl) .... Apply 1 patch every 3 days along with 12 micrograms patch 8)  Melatonin 3 Mg Caps (Melatonin) .... Take 1 capsule by mouth once daily 9)  Nystatin 100000 Unit/gm Crea (Nystatin) .... Apply topically to affected area three times daily as needed 10)  Vitamin E  Complex 400 Unit Caps (Vitamin e) .... Take 1 tablet by mouth once daily 11)  Valerian Root 450 Mg Caps (Valerian) .... Take 1 by mouth once daily 12)  Laxative Plus Stool Softener 30-100 Mg Caps (Casanthranol-docusate sodium) .... Take 1 tablet 4 times daily as needed 13)  Acidophilus Probiotic Blend Caps (Misc intestinal flora regulat) .... Take 1 tablet by mouth once daily 14)  Lutein-zeaxanthin 6-1 Mg Tabs (Lutein-zeaxanthin) .... Take 1 by mouth once daily 15)  Vitamin D3 5000 Unit/ml Liqd (Cholecalciferol) .... Take 1 by mouth once daily 16)  Torsemide 20 Mg Tabs (Torsemide) .... Take 1  tablets by mouth daily. 17)  Amlodipine Besylate 10 Mg Tabs (Amlodipine besylate) .... Take one tablet by mouth daily 18)  Silver Protein Strong Powd (Silver protein strong) .... As needed  Other Orders: UA Dipstick W/ Micro (manual) (04540)  Patient Instructions: 1)  Please schedule a follow-up appointment in 4 months .    Orders Added: 1)  UA Dipstick W/ Micro (manual) [81000] 2)  Est. Patient Level IV [45409]    Current Allergies (reviewed today): ! AUGMENTIN ! CEPHALEXIN ! CEFTIN ! SULFA  Laboratory Results   Urine Tests  Date/Time Received: May 14, 2010 12:16 PM Date/Time Reported: May 14, 2010 12:16 PM  Routine Urinalysis   Color: yellow Appearance: Cloudy Glucose: negative   (Normal Range: Negative) Bilirubin: negative   (Normal Range: Negative) Ketone: negative   (Normal Range: Negative) Spec. Gravity: 1.010   (Normal Range: 1.003-1.035) Blood: negative   (Normal Range: Negative) pH: 7.0   (Normal Range: 5.0-8.0) Protein: negative   (Normal Range: Negative) Urobilinogen: 0.2   (Normal Range: 0-1) Nitrite: negative   (Normal Range: Negative) Leukocyte Esterace: small   (Normal Range: Negative)

## 2010-06-07 NOTE — Progress Notes (Signed)
Summary: fentanyl  Phone Note Refill Request Call back at Work Phone [phone] Message from:  Patient on May 10, 2010 2:04 PM  Refills Requested: Medication #1:  FENTANYL 25 MCG/HR PT72 apply 1 patch every 3 days along with 12 micrograms patch Patient's daughter says that patient can not wait for Dr. Karle Starch return.   Initial call taken by: Melody Comas,  May 10, 2010 2:05 PM  Follow-up for Phone Call        If the patient could not wait for my return, why was this note sent to just me while I am out of the office??? Follow-up by: Cindee Salt MD,  May 11, 2010 5:44 PM  Additional Follow-up for Phone Call Additional follow up Details #1::        I had talked with patient's daughter and she had said that her mom had an appt with you today and she would get it when she comes in. I just forgot to change it in the note.  Melody Comas  May 14, 2010 8:06 AM     Additional Follow-up for Phone Call Additional follow up Details #2::    Rx written Follow-up by: Cindee Salt MD,  May 14, 2010 12:54 PM  Prescriptions: FENTANYL 25 MCG/HR PT72 (FENTANYL) apply 1 patch every 3 days along with 12 micrograms patch  #10 x 0   Entered and Authorized by:   Cindee Salt MD   Signed by:   Cindee Salt MD on 05/14/2010   Method used:   Print then Give to Patient   RxID:   [id number]

## 2010-06-07 NOTE — Progress Notes (Signed)
Summary: pt takes pyridium  Phone Note Call from Patient   Caller: Daughter [name] Summary of Call: Pt's daughter called to let you know that pt has script for pyridium 200 mg that she only takes if she needs to, usually one a day, but she can take up to 3 times a day.  Dr. Shawnie Pons gave this to her. Initial call taken by: Lowella Petties CMA, AAMA,  April 16, 2010 2:38 PM  Follow-up for Phone Call        Please let her know that this is to be used to treat pain with urinary or bladder infection and is not intended for regular use Follow-up by: Cindee Salt MD,  April 16, 2010 2:40 PM  Additional Follow-up for Phone Call Additional follow up Details #1::        Spoke with patient's daughter and advised results.  Additional Follow-up by: Mervin Hack CMA (AAMA),  April 16, 2010 3:20 PM    New/Updated Medications: PYRIDIUM 200 MG TABS (PHENAZOPYRIDINE HCL) as needed and as directed

## 2010-06-07 NOTE — Progress Notes (Signed)
Summary: Medication  Phone Note Call from Patient Call back at Home Phone [phone] Call back at Work Phone [phone]   Caller: [name] (Daughter) Call For: Shirlee Latch Summary of Call: Pt has completed the first bottle of Amlodipene.  Does the pt need to come in for bloodwork or some other kind of f/u on the medication. Initial call taken by: Harlon Flor,  April 16, 2010 9:34 AM  Follow-up for Phone Call        Attempted to call [facility]. Pt has 6 refills from 02/2010. Lanny Hurst RN  April 16, 2010 5:02 PM   Attempted to call again [facility] Lanny Hurst RN  April 17, 2010 4:08 PM   pt notified to have amlodipine refilled and to continue as directed. pt is ok with this. Follow-up by: Lanny Hurst RN,  April 18, 2010 4:38 PM

## 2010-06-07 NOTE — Progress Notes (Signed)
Summary: Called pt  Phone Note Outgoing Call Call back at [facility] Phone [phone]   Call placed by: Harlon Flor,  May 22, 2010 8:58 AM Call placed to: Patient Summary of Call: Called pt to change the time of her appt on 2/24.  Pt will call back after checking with her daughter. Initial call taken by: Harlon Flor,  May 22, 2010 8:58 AM

## 2010-06-07 NOTE — Progress Notes (Signed)
Summary: refill request for oxycodone  Phone Note Refill Request Call back at Home Phone [phone] Call back at Work Phone [phone] Message from:  daughter Alyssa Grove Requested: Medication #1:  OXYCODONE HCL 5 MG TABS 1 up to four times daily as needed for severe pain PLEASE FILL AFTER 12/21 Please call [name] when ready.  Initial call taken by: Lowella Petties CMA, AAMA,  May 28, 2010 11:59 AM  Follow-up for Phone Call        Rx written Follow-up by: Cindee Salt MD,  May 28, 2010 1:49 PM  Additional Follow-up for Phone Call Additional follow up Details #1::        Spoke with patient's daughter and advised results.  Additional Follow-up by: Mervin Hack CMA (AAMA),  May 28, 2010 2:20 PM    New/Updated Medications: OXYCODONE HCL 5 MG TABS (OXYCODONE HCL) 1 up to four times daily as needed for severe pain Prescriptions: OXYCODONE HCL 5 MG TABS (OXYCODONE HCL) 1 up to four times daily as needed for severe pain  #120 x 0   Entered by:   Mervin Hack CMA (AAMA)   Authorized by:   Cindee Salt MD   Signed by:   Mervin Hack CMA (AAMA) on 05/28/2010   Method used:   Print then Give to Patient   RxID:   [id number] OXYCODONE HCL 5 MG TABS (OXYCODONE HCL) 1 up to four times daily as needed for severe pain PLEASE FILL AFTER 12/21  #120 x 0   Entered and Authorized by:   Cindee Salt MD   Signed by:   Cindee Salt MD on 05/28/2010   Method used:   Print then Give to Patient   RxID:   [id number]

## 2010-06-07 NOTE — Progress Notes (Signed)
Summary: Referral for home health services  Phone Note Call from Patient Call back at [facility] Phone [phone] Call back at [phone] ext. [phone]   Caller: Daughter-Clair Call For: Cindee Salt MD Summary of Call: Daughter is requesting that Dr. Alphonsus Sias send a referral to [facility] for her mother to receive home health services for bathing her mom at least twice a week.  Please advise.  When calling please ask to speak with the referral nurse. Initial call taken by: Linde Gillis CMA Duncan Dull),  May 01, 2010 2:32 PM  Follow-up for Phone Call        okay to make referral for services Personal care services like that are generally not reimbursed by regular insurance Follow-up by: Cindee Salt MD,  May 02, 2010 7:54 AM

## 2010-06-07 NOTE — Assessment & Plan Note (Signed)
Summary: ?UTI/CLE   Vital Signs:  Patient profile:   77 year old female Weight:      262 pounds Temp:     98.0 degrees F oral BP sitting:   150 / 80  (left arm) Cuff size:   large  Vitals Entered By: Mervin Hack CMA Duncan Dull) (April 16, 2010 11:23 AM) CC: UTI? buring and discomfort with urination   History of Present Illness: Having burning dysuria for about 4 days uses OTC analgesic Tries to increase fluid intake  No fever but did feel slightly warm No aching No hematuria Has some incontinence May have localized irritation from the pads--does use zinc oxide cream regularly  Allergies: 1)  ! Augmentin 2)  ! Cephalexin 3)  ! Ceftin 4)  ! Sulfa  Past History:  Past medical, surgical, family and social histories (including risk factors) reviewed for relevance to current acute and chronic problems.  Past Medical History: Reviewed history from 01/29/2010 and no changes required. 1. Hypertension 2. Hypothyroidism 3. Osteoporosis 4. Fibromyalgia 5. Chronic lower extremity edema.  6. Cervical disk disease 7. Follicular lymphoma: Quiescent.  Dr Cherene Altes, Dr Elenore Rota 8. L frontal meningioma 9. GERD 10. Obesity 11. Cerebral hemorrhage: 2004, hypertensive.  12. H/o Urinary Tract Infection 13. Right renal artery aneurysm w/o stenosis, followed by Dr. Gilda Crease 14. Diastolic CHF: Echo (9/11) with EF 65-70%, mild LVH, mid AI, mild MR, PA systolic pressure > 40 mmHg.   Past Surgical History: Reviewed history from 12/22/2008 and no changes required. Cataract   L 10/05    R 4/07 [facility]) Hysterectomy--1958 Cerebral hemorrage--6/04 1985 cervical fusion 6/05  Tachycardia Left shoulder fracture--1990's Recticil/cysticil -1982  Family History: Reviewed history from 08/19/2006 and no changes required. Dad died @80  CVAs Mom died @91  myeloma Only child No CAD, DM HTN in family No cancer  Social History: Reviewed history from 01/01/2010 and no changes  required. Retired--farmed/taught  Lives in [place] Married   3 sons/1 daughter Quit smoking 50 years ago Alcohol use-no  Review of Systems       Occ nausea without vomiting appetite is about the same has used acidophilus tablets when on antibiotics---thinks she gets yeast in her sinuses and ears  Physical Exam  General:  alert.  NAD Heart:  normal rate, regular rhythm, and no gallop.   Gr 2/6 systolic murmur Abdomen:  soft, non-tender, and no masses.   Msk:  no CVA tenderness   Impression & Recommendations:  Problem # 1:  ACUTE CYSTITIS (ICD-595.0) Assessment Deteriorated  will try treatment for 3 days discussed topical Rx to prevent irritation continue acidophilus try cranberry extract  Her updated medication list for this problem includes:    Ciprofloxacin Hcl 250 Mg Tabs (Ciprofloxacin hcl) .Marland Kitchen... 1 tab by mouth two times a day for bladder infection  Orders: UA Dipstick w/o Micro (manual) (16109)  Complete Medication List: 1)  Tekturna 300 Mg Tabs (Aliskiren fumarate) .... Take 1 tablet by mouth once a day 2)  Thyrolar-1/4 15 (3.1-12.5) Mg (mcg) Tabs (Liotrix (t3-t4)) .... Take 1 by mouth once daily 3)  Toprol Xl 200 Mg Tb24 (Metoprolol succinate) .... Take 1 tablet by mouth once daily 4)  Oxycodone Hcl 5 Mg Tabs (Oxycodone hcl) .Marland Kitchen.. 1 up to four times daily as needed for severe pain please fill after 12/21 5)  Klor-con M20 20 Meq Tbcr (Potassium chloride crys cr) .... Take 1  tablet by mouth every day 6)  Lisinopril 40 Mg Tabs (Lisinopril) .... Take 1 tablet by  mouth once daily 7)  Fentanyl 25 Mcg/hr Pt72 (Fentanyl) .... Apply 1 patch every 3 days along with 12 micrograms patch 8)  Melatonin 3 Mg Caps (Melatonin) .... Take 1 capsule by mouth once daily 9)  Nystatin 100000 Unit/gm Crea (Nystatin) .... Apply topically to affected area three times daily as needed 10)  Vitamin E Complex 400 Unit Caps (Vitamin e) .... Take 1 tablet by mouth once daily 11)   Valerian Root 450 Mg Caps (Valerian) .... Take 1 by mouth once daily 12)  Laxative Plus Stool Softener 30-100 Mg Caps (Casanthranol-docusate sodium) .... Take 1 tablet 4 times daily as needed 13)  Acidophilus Probiotic Blend Caps (Misc intestinal flora regulat) .... Take 1 tablet by mouth once daily 14)  Lutein-zeaxanthin 6-1 Mg Tabs (Lutein-zeaxanthin) .... Take 1 by mouth once daily 15)  Vitamin D3 5000 Unit/ml Liqd (Cholecalciferol) .... Take 1 by mouth once daily 16)  Torsemide 20 Mg Tabs (Torsemide) .... Take 1  tablets by mouth daily. 17)  Amlodipine Besylate 10 Mg Tabs (Amlodipine besylate) .... Take one tablet by mouth daily 18)  Ciprofloxacin Hcl 250 Mg Tabs (Ciprofloxacin hcl) .Marland Kitchen.. 1 tab by mouth two times a day for bladder infection  Patient Instructions: 1)  Please start the ciprofloxacin again for the bladder infection. If the painful urination is gone with the first or second dose, please stop after 3 days of treatment 2)  Please try a daily cranberry tab to prevent urniary infection 3)  Please keep the January appt Prescriptions: OXYCODONE HCL 5 MG TABS (OXYCODONE HCL) 1 up to four times daily as needed for severe pain PLEASE FILL AFTER 12/21  #120 x 0   Entered and Authorized by:   Cindee Salt MD   Signed by:   Cindee Salt MD on 04/16/2010   Method used:   Print then Give to Patient   RxID:   [id number] CIPROFLOXACIN HCL 250 MG TABS (CIPROFLOXACIN HCL) 1 tab by mouth two times a day for bladder infection  #20 x 1   Entered and Authorized by:   Cindee Salt MD   Signed by:   Cindee Salt MD on 04/16/2010   Method used:   Electronically to        [facility] (retail)       [address], [address]       [place], [place]  [address]       Ph: [phone]       Fax: [phone]   RxID:   [id number]    Orders Added: 1)  Est. Patient Level III [64403] 2)  UA Dipstick w/o Micro (manual)  [81002]    Current Allergies (reviewed today): ! AUGMENTIN ! CEPHALEXIN ! CEFTIN ! SULFA  Laboratory Results   Urine Tests  Date/Time Received: April 16, 2010 11:34 AM Date/Time Reported: April 16, 2010 11:34 AM  Routine Urinalysis   Color: yellow Appearance: Hazy Glucose: negative   (Normal Range: Negative) Bilirubin: negative   (Normal Range: Negative) Ketone: negative   (Normal Range: Negative) Spec. Gravity: 1.015   (Normal Range: 1.003-1.035) Blood: negative   (Normal Range: Negative) pH: 6.0   (Normal Range: 5.0-8.0) Protein: negative   (Normal Range: Negative) Urobilinogen: 0.2   (Normal Range: 0-1) Nitrite: negative   (Normal Range: Negative) Leukocyte Esterace: negative   (Normal Range: Negative)

## 2010-06-07 NOTE — Progress Notes (Signed)
Summary: pt has a bug  Phone Note Call from Patient   Caller: Son Summary of Call: Pt's son states that pt has a bug- has had some vomiting, but that's better now.  Temp around 100, feels achy.  Asks what to do.  Advised fluids, rest,  tylenol. They are giving her one tylenol every 2 hours, which they crush up and put in honey in order to get her to take it. Initial call taken by: Lowella Petties CMA, AAMA,  April 20, 2010 8:34 AM  Follow-up for Phone Call        she can take 325mg  tylenol crushed up.  can take up to 8 a day.  follow up as needed.  thanks.  Follow-up by: Crawford Givens MD,  April 20, 2010 1:50 PM  Additional Follow-up for Phone Call Additional follow up Details #1::        Spoke with patient and advised results.  Additional Follow-up by: Mervin Hack CMA Duncan Dull),  April 20, 2010 2:07 PM

## 2010-06-15 ENCOUNTER — Telehealth: Payer: Self-pay | Admitting: Internal Medicine

## 2010-06-15 ENCOUNTER — Telehealth: Payer: Self-pay | Admitting: Cardiology

## 2010-06-21 NOTE — Progress Notes (Signed)
Summary: needs 90 day written scripts for mail order  Phone Note Refill Request Call back at Home Phone [phone] Call back at Work Phone [phone] Message from:  daughter Alyssa Grove Requested: Medication #1:  TOPROL XL 200 MG TB24 take 1 tablet by mouth once daily  Medication #2:  KLOR-CON M20 20 MEQ  TBCR Take 1  tablet by mouth every day  Medication #3:  LISINOPRIL 40 MG TABS take 1 tablet by mouth once daily  Medication #4:  TORSEMIDE 20 MG TABS Take 1  tablets by mouth daily. Pt is switching to [facility] and needs written 90 day scripts to mail lto them.  Please call [name] when ready and she will pick up.  Initial call taken by: Lowella Petties CMA, AAMA,  June 15, 2010 12:23 PM  Follow-up for Phone Call        Rx written by cardiologist and sent through computer to [facility] let them know please  Rx not printed Follow-up by: Cindee Salt MD,  June 15, 2010 1:36 PM  Additional Follow-up for Phone Call Additional follow up Details #1::        Spoke with patient's daughter and advised results.  Additional Follow-up by: Mervin Hack CMA (AAMA),  June 15, 2010 2:28 PM    New/Updated Medications: TOPROL XL 200 MG TB24 (METOPROLOL SUCCINATE) take 1 tablet by mouth once daily KLOR-CON M20 20 MEQ  TBCR (POTASSIUM CHLORIDE CRYS CR) Take 1  tablet by mouth every day LISINOPRIL 40 MG TABS (LISINOPRIL) take 1 tablet by mouth once daily TORSEMIDE 20 MG TABS (TORSEMIDE) Take 1  tablets by mouth daily

## 2010-06-21 NOTE — Progress Notes (Signed)
Summary: RX  Phone Note Call from Patient Call back at Home Phone [phone]   Caller: Daguhter [name]) Call For: [facility] Summary of Call: Please send all medications to [facility].  Fax # [phone] Initial call taken by: Harlon Flor,  June 15, 2010 9:50 AM    Prescriptions: TORSEMIDE 20 MG TABS (TORSEMIDE) Take 1  tablets by mouth daily.  #90 x 3   Entered by:   Lysbeth Galas CMA   Authorized by:   Marca Ancona, MD   Signed by:   Lysbeth Galas CMA on 06/15/2010   Method used:   Faxed to ...       [facility] (mail-order)             , [place]         Ph: [phone]       Fax: [phone]   RxID:   [id number] AMLODIPINE BESYLATE 10 MG TABS (AMLODIPINE BESYLATE) Take one tablet by mouth daily  #90 x 3   Entered by:   Lysbeth Galas CMA   Authorized by:   Marca Ancona, MD   Signed by:   Lysbeth Galas CMA on 06/15/2010   Method used:   Faxed to ...       [facility] (mail-order)             , [place]         Ph: [phone]       Fax: [phone]   RxID:   [id number] LISINOPRIL 40 MG TABS (LISINOPRIL) take 1 tablet by mouth once daily  #90 x 3   Entered by:   Lysbeth Galas CMA   Authorized by:   Marca Ancona, MD   Signed by:   Lysbeth Galas CMA on 06/15/2010   Method used:   Faxed to ...       [facility] (mail-order)             , [place]         Ph: [phone]       Fax: [phone]   RxID:   [id number] KLOR-CON M20 20 MEQ  TBCR (POTASSIUM CHLORIDE CRYS CR) Take 1  tablet by mouth every day  #90 x 3   Entered by:   Lysbeth Galas CMA   Authorized by:   Marca Ancona, MD   Signed by:   Lysbeth Galas CMA on 06/15/2010   Method used:   Faxed to ...       [facility] (mail-order)             , [place]         Ph: [phone]       Fax: [phone]   RxID:   [id number] TOPROL XL 200 MG TB24 (METOPROLOL SUCCINATE) take 1 tablet by mouth once daily  #90 x 3   Entered by:   Lysbeth Galas CMA   Authorized by:   Marca Ancona, MD   Signed by:   Lysbeth Galas CMA on 06/15/2010  Method used:   Faxed to ...       [facility] (mail-order)             , [place]         Ph: [phone]       Fax: [phone]   RxID:   [id number] TEKTURNA 300 MG TABS (ALISKIREN FUMARATE) Take 1 tablet by mouth once a day  #90 x 3   Entered by:   Lysbeth Galas CMA   Authorized by:   Marca Ancona, MD  Signed by:   Lysbeth Galas CMA on 06/15/2010   Method used:   Faxed to ...       [facility] (mail-order)             , [place]         Ph: [phone]       Fax: [phone]   RxID:   [id number]

## 2010-06-25 ENCOUNTER — Telehealth: Payer: Self-pay | Admitting: Internal Medicine

## 2010-06-29 ENCOUNTER — Encounter: Payer: Self-pay | Admitting: Cardiology

## 2010-06-29 ENCOUNTER — Ambulatory Visit (INDEPENDENT_AMBULATORY_CARE_PROVIDER_SITE_OTHER): Payer: Medicare Other | Admitting: Cardiology

## 2010-06-29 DIAGNOSIS — I10 Essential (primary) hypertension: Secondary | ICD-10-CM

## 2010-06-29 DIAGNOSIS — I5032 Chronic diastolic (congestive) heart failure: Secondary | ICD-10-CM

## 2010-06-29 DIAGNOSIS — R0602 Shortness of breath: Secondary | ICD-10-CM | POA: Insufficient documentation

## 2010-07-02 LAB — CONVERTED CEMR LAB
BUN: 24 mg/dL — ABNORMAL HIGH (ref 6–23)
CO2: 26 meq/L (ref 19–32)
Calcium: 11 mg/dL — ABNORMAL HIGH (ref 8.4–10.5)
Creatinine, Ser: 0.81 mg/dL (ref 0.40–1.20)

## 2010-07-03 NOTE — Progress Notes (Signed)
Summary: refill request for oxycodone, fentanyl  Phone Note Refill Request Call back at Home Phone [phone] Call back at Work Phone [phone] Message from:  daughter, Alyssa Grove Requested: Medication #1:  OXYCODONE HCL 5 MG TABS 1 up to four times daily as needed for severe pain  Medication #2:  FENTANYL 25 MCG/HR PT72 apply 1 patch every 3 days along with 12 micrograms patch Please call daughter when ready.  Initial call taken by: Lowella Petties CMA, AAMA,  June 25, 2010 8:56 AM  Follow-up for Phone Call        Rx written Follow-up by: Cindee Salt MD,  June 25, 2010 1:38 PM  Additional Follow-up for Phone Call Additional follow up Details #1::        left message on machine that rx ready for pick-up  Additional Follow-up by: DeShannon Smith CMA Duncan Dull),  June 25, 2010 3:03 PM    Prescriptions: FENTANYL 25 MCG/HR PT72 (FENTANYL) apply 1 patch every 3 days along with 12 micrograms patch  #10 x 0   Entered and Authorized by:   Cindee Salt MD   Signed by:   Cindee Salt MD on 06/25/2010   Method used:   Print then Give to Patient   RxID:   [id number] OXYCODONE HCL 5 MG TABS (OXYCODONE HCL) 1 up to four times daily as needed for severe pain  #120 x 0   Entered and Authorized by:   Cindee Salt MD   Signed by:   Cindee Salt MD on 06/25/2010   Method used:   Print then Give to Patient   RxID:   [id number]

## 2010-07-06 ENCOUNTER — Ambulatory Visit (INDEPENDENT_AMBULATORY_CARE_PROVIDER_SITE_OTHER): Payer: Medicare Other | Admitting: Internal Medicine

## 2010-07-06 ENCOUNTER — Encounter: Payer: Self-pay | Admitting: Internal Medicine

## 2010-07-11 LAB — CONVERTED CEMR LAB
Calcium Ionized: 1.41 mmol/L — ABNORMAL HIGH (ref 1.12–1.32)
PTH: 133.6 pg/mL — ABNORMAL HIGH (ref 14.0–72.0)

## 2010-07-12 NOTE — Assessment & Plan Note (Signed)
Summary: discuss labs/alc   Vital Signs:  Patient profile:   77 year old female Weight:      269 pounds Temp:     98.4 degrees F oral BP sitting:   170 / 100  (left arm) Cuff size:   large  Vitals Entered By: Linde Gillis CMA Duncan Dull) (July 06, 2010 12:49 PM) CC: discuss labs   History of Present Illness: here due to elevated calcium level Has been in the low 10 level for 3 years at least  Only recent change is metoprolol changed to carvedilol Recent eye work--still getting over this (he put his hand on head for stability and got some trouble with neck)  No sig change in energy levels No change in bowels--still on stool softener and olive oil    Allergies: 1)  ! Augmentin 2)  ! Cephalexin 3)  ! Ceftin 4)  ! Sulfa  Past History:  Past medical, surgical, family and social histories (including risk factors) reviewed for relevance to current acute and chronic problems.  Past Medical History: Reviewed history from 06/29/2010 and no changes required. 1. Hypertension: Difficult to control.  2. Hypothyroidism 3. Osteoporosis 4. Fibromyalgia 5. Chronic lower extremity edema.  6. Cervical disk disease 7. Follicular lymphoma: Quiescent.  Dr Cherene Altes, Dr Elenore Rota 8. L frontal meningioma 9. GERD 10. Obesity 11. Cerebral hemorrhage: 2004, hypertensive.  12. H/o Urinary Tract Infection 13. Right renal artery aneurysm w/o stenosis, followed by Dr. Gilda Crease 14. Diastolic CHF: Echo (9/11) with EF 65-70%, mild LVH, mid AI, mild MR, PA systolic pressure > 40 mmHg.   Past Surgical History: Reviewed history from 12/22/2008 and no changes required. Cataract   L 10/05    R 4/07 [facility]) Hysterectomy--1958 Cerebral hemorrage--6/04 1985 cervical fusion 6/05  Tachycardia Left shoulder fracture--1990's Recticil/cysticil -1982  Family History: Reviewed history from 08/19/2006 and no changes required. Dad died @80  CVAs Mom died @91  myeloma Only child No CAD, DM HTN in  family No cancer  Social History: Reviewed history from 06/29/2010 and no changes required. Retired--farmed/taught  Lives in [place] Married   3 sons/1 daughter Quit smoking 50 years ago Alcohol use-no   Impression & Recommendations:  Problem # 1:  HYPERCALCEMIA (ICD-275.42) Assessment New  has stopped vitamin D and calcium per the cardiologist counselled most of 15 minute visit  will check ionized calcium and PTH if evidence of primary hyperparathyroidism --would just observe if PTH suppressed with elevated calcium, may want to do bone scan to check for lesions (but I think this is unlikely)  Orders: Venipuncture ([address]109) Specimen Handling (60454) T- * Misc. Laboratory test [phone])  Complete Medication List: 1)  Tekturna 300 Mg Tabs (Aliskiren fumarate) .... Take 1 tablet by mouth once a day 2)  Thyrolar-1/4 15 (3.1-12.5) Mg (mcg) Tabs (Liotrix (t3-t4)) .... Take 1 by mouth once daily 3)  Oxycodone Hcl 5 Mg Tabs (Oxycodone hcl) .Marland Kitchen.. 1 up to four times daily as needed for severe pain 4)  Klor-con M20 20 Meq Tbcr (Potassium chloride crys cr) .... Take 1  tablet by mouth every day 5)  Lisinopril 40 Mg Tabs (Lisinopril) .... Take 1 tablet by mouth once daily 6)  Fentanyl 25 Mcg/hr Pt72 (Fentanyl) .... Apply 1 patch every 3 days along with 12 micrograms patch 7)  Melatonin 3 Mg Caps (Melatonin) .... Take 1 capsule by mouth once daily 8)  Nystatin 100000 Unit/gm Crea (Nystatin) .... Apply topically to affected area three times daily as needed 9)  Vitamin E  Complex 400 Unit Caps (Vitamin e) .... Take 1 tablet by mouth once daily 10)  Valerian Root 450 Mg Caps (Valerian) .... Take 1 by mouth once daily 11)  Laxative Plus Stool Softener 30-100 Mg Caps (Casanthranol-docusate sodium) .... Take 1 tablet 4 times daily as needed 12)  Acidophilus Probiotic Blend Caps (Misc intestinal flora regulat) .... Take 1 tablet by mouth once daily 13)  Lutein-zeaxanthin 6-1 Mg Tabs  (Lutein-zeaxanthin) .... Take 1 by mouth once daily 14)  Vitamin D3 5000 Unit/ml Liqd (Cholecalciferol) .... Take 1 by mouth once daily 15)  Torsemide 20 Mg Tabs (Torsemide) .... Take 1  tablets by mouth daily 16)  Amlodipine Besylate 10 Mg Tabs (Amlodipine besylate) .... Take one tablet by mouth daily 17)  Silver Protein Strong Powd (Silver protein strong) .... As needed 18)  Carvedilol 25 Mg Tabs (Carvedilol) .... Take one tablet by mouth twice a day  Patient Instructions: 1)  Please keep your regular appointment   Orders Added: 1)  Venipuncture [36415] 2)  Specimen Handling [99000] 3)  T- * Misc. Laboratory test [99999] 4)  Est. Patient Level III [62130]    Current Allergies (reviewed today): ! AUGMENTIN ! CEPHALEXIN ! CEFTIN ! SULFA

## 2010-07-12 NOTE — Assessment & Plan Note (Signed)
Summary: ROV/AMD   Visit Type:  Follow-up Primary Provider:  Tillman Abide, MD  CC:  c/o shortness of breath..  History of Present Illness: 77 yo with history of HTN, obesity, and chronic lower extremity edema presents for cardiology followup.  She has had leg swelling bilaterally for about 10 years.  She has been seen by vascular surgery in [place] and is thought to have a component of venous insufficiency. She cannot wear compression stockings (unable to put them on).  She has been short of breath after walking 30 feet for over a year.  She sleeps with 3 pillows at night, mainly for GERD rather than orthopnea.  No chest pain/tightness.  SBP at home has been running in the 130s-140s per patient and daughter.  However, BP today in the office is 200/91.  She says she took her meds today.  Weight is up another 5 lbs.  She never did the sleep study.  She is only taking her torsemide about twice a week due to incontinence.  This apparently is a very severe, lifestyle limiting problem.    Labs (8/11): BNP 50 Labs (9/11): LDL 133, HDL 53, K 4.0, creatinine 0.8, LFTs normal, TSH normal Labs (10/11): K 4.1, creatinine 0.78  ECG: NSR, LAFB, LVH, 1st degree AV block  Current Medications (verified): 1)  Tekturna 300 Mg Tabs (Aliskiren Fumarate) .... Take 1 Tablet By Mouth Once A Day 2)  Thyrolar-1/4 15 (3.1-12.5) Mg (Mcg) Tabs (Liotrix (T3-T4)) .... Take 1 By Mouth Once Daily 3)  Toprol Xl 200 Mg Tb24 (Metoprolol Succinate) .... Take 1 Tablet By Mouth Once Daily 4)  Oxycodone Hcl 5 Mg Tabs (Oxycodone Hcl) .Marland Kitchen.. 1 Up To Four Times Daily As Needed For Severe Pain 5)  Klor-Con M20 20 Meq  Tbcr (Potassium Chloride Crys Cr) .... Take 1  Tablet By Mouth Every Day 6)  Lisinopril 40 Mg Tabs (Lisinopril) .... Take 1 Tablet By Mouth Once Daily 7)  Fentanyl 25 Mcg/hr Pt72 (Fentanyl) .... Apply 1 Patch Every 3 Days Along With 12 Micrograms Patch 8)  Melatonin 3 Mg Caps (Melatonin) .... Take 1 Capsule By Mouth  Once Daily 9)  Nystatin 100000 Unit/gm  Crea (Nystatin) .... Apply Topically To Affected Area Three Times Daily As Needed 10)  Vitamin E Complex 400 Unit Caps (Vitamin E) .... Take 1 Tablet By Mouth Once Daily 11)  Valerian Root 450 Mg Caps (Valerian) .... Take 1 By Mouth Once Daily 12)  Laxative Plus Stool Softener 30-100 Mg Caps (Casanthranol-Docusate Sodium) .... Take 1 Tablet 4 Times Daily As Needed 13)  Acidophilus Probiotic Blend  Caps (Misc Intestinal Flora Regulat) .... Take 1 Tablet By Mouth Once Daily 14)  Lutein-Zeaxanthin 6-1 Mg Tabs (Lutein-Zeaxanthin) .... Take 1 By Mouth Once Daily 15)  Vitamin D3 5000 Unit/ml Liqd (Cholecalciferol) .... Take 1 By Mouth Once Daily 16)  Torsemide 20 Mg Tabs (Torsemide) .... Take 1  Tablets By Mouth Daily 17)  Amlodipine Besylate 10 Mg Tabs (Amlodipine Besylate) .... Take One Tablet By Mouth Daily 18)  Silver Protein Strong  Powd (Silver Protein Strong) .... As Needed  Allergies (verified): 1)  ! Augmentin 2)  ! Cephalexin 3)  ! Ceftin 4)  ! Sulfa  Past History:  Past Surgical History: Last updated: 12/22/2008 Cataract   L 10/05    R 4/07 [facility]) Hysterectomy--1958 Cerebral hemorrage--6/04 1985 cervical fusion 6/05  Tachycardia Left shoulder fracture--1990's Recticil/cysticil -1982  Family History: Last updated: 09-13-06 Dad died @80  CVAs Mom died @91  myeloma  Only child No CAD, DM HTN in family No cancer  Social History: Last updated: 06/29/2010 Retired--farmed/taught  Lives in [place] Married   3 sons/1 daughter Quit smoking 50 years ago Alcohol use-no  Risk Factors: Smoking Status: never (08/19/2006)  Past Medical History: 1. Hypertension: Difficult to control.  2. Hypothyroidism 3. Osteoporosis 4. Fibromyalgia 5. Chronic lower extremity edema.  6. Cervical disk disease 7. Follicular lymphoma: Quies[facility].  Dr Cherene Altes, Dr Elenore Rota 8. L frontal meningioma 9. GERD 10. Obesity 11. Cerebral  hemorrhage: 2004, hypertensive.  12. H/o Urinary Tract Infection 13. Right renal artery aneurysm w/o stenosis, followed by Dr. Gilda Crease 14. Diastolic CHF: Echo (9/11) with EF 65-70%, mild LVH, mid AI, mild MR, PA systolic pressure > 40 mmHg.   Family History: Reviewed history from 08/19/2006 and no changes required. Dad died @80  CVAs Mom died @91  myeloma Only child No CAD, DM HTN in family No cancer  Social History: Reviewed history from 01/01/2010 and no changes required. Retired--farmed/taught  Lives in [place] Married   3 sons/1 daughter Quit smoking 50 years ago Alcohol use-no  Review of Systems       All systems reviewed and negative except as per HPI.   Vital Signs:  Patient profile:   77 year old female Height:      62.5 inches Weight:      270.8 pounds BMI:     48.92 Pulse rate:   59 / minute BP sitting:   200 / 91  (left arm) Cuff size:   large  Vitals Entered By: Bishop Dublin, CMA (June 29, 2010 2:58 PM)  O2 Flow:  Room air  Physical Exam  General:  Well developed, well nourished, in no acute distress.  Obese. Neck:  Neck supple, JVP 8 cm. No masses, thyromegaly or abnormal cervical nodes. Lungs:  Clear bilaterally to auscultation and percussion. Heart:  Non-displaced PMI, chest non-tender; regular rate and rhythm, S1, S2 without rubs or gallops. 2/6 SEM RUSB.  Carotid upstroke normal, no bruit.  2+ edema to thighs bilaterally. Abdomen:  Bowel sounds positive; abdomen soft and non-tender without masses, organomegaly, or hernias noted. No hepatosplenomegaly. Extremities:  No clubbing or cyanosis. Neurologic:  Alert and oriented x 3. Psych:  Normal affect.   Impression & Recommendations:  Problem # 1:  HYPERTENSION (ICD-401.9) BP quite high today in the office.  She has taken her meds.  ? mostly normal readings now per patient at home.  I will have her stop Toprol XL and use Coreg 25 mg two times a day for better BP control.  She will continue  on Tekturna, lisinopril, and amlodipine.  BMET/BNP today.   Problem # 2:  CHRONIC DIASTOLIC HEART FAILURE (ICD-428.32) SIgnificant lower extremity edema is likely due to a combination of diastolic CHF and venous insufficiency.  I would like her to take her torsemide daily if possible (she takes about 3 times a week).  She does have quite significant incontinence that limits her ability to use the diuretic.  Would consider urology evaluation.  She would be high risk for any surgery but relates that she is really miserable with the severe incontinence.   Other Orders: T-Basic Metabolic Panel [phone]) T-BNP  (B Natriuretic Peptide) [phone])  Patient Instructions: 1)  Your physician recommends that you schedule a follow-up appointment in: 2 months 2)  Your physician has recommended you make the following change in your medication: STOP Toprol. START Coreg 25mg  two times a day. Take Torsemide as often as  you can as directed. Prescriptions: CARVEDILOL 25 MG TABS (CARVEDILOL) Take one tablet by mouth twice a day  #60 x 6   Entered by:   Lanny Hurst RN   Authorized by:   Marca Ancona, MD   Signed by:   Lanny Hurst RN on 06/29/2010   Method used:   Electronically to        [facility]  [address] (retail)       [address], [address]       [place], [place]  [address]       Ph: [phone]       Fax: [phone]   RxID:   [id number]

## 2010-07-16 ENCOUNTER — Encounter: Payer: Self-pay | Admitting: Internal Medicine

## 2010-07-16 DIAGNOSIS — M797 Fibromyalgia: Secondary | ICD-10-CM | POA: Insufficient documentation

## 2010-07-16 DIAGNOSIS — I5032 Chronic diastolic (congestive) heart failure: Secondary | ICD-10-CM | POA: Insufficient documentation

## 2010-07-16 DIAGNOSIS — IMO0002 Reserved for concepts with insufficient information to code with codable children: Secondary | ICD-10-CM | POA: Insufficient documentation

## 2010-07-16 DIAGNOSIS — K219 Gastro-esophageal reflux disease without esophagitis: Secondary | ICD-10-CM | POA: Insufficient documentation

## 2010-07-16 DIAGNOSIS — M810 Age-related osteoporosis without current pathological fracture: Secondary | ICD-10-CM

## 2010-07-16 DIAGNOSIS — E039 Hypothyroidism, unspecified: Secondary | ICD-10-CM

## 2010-07-16 DIAGNOSIS — E669 Obesity, unspecified: Secondary | ICD-10-CM

## 2010-07-16 DIAGNOSIS — I722 Aneurysm of renal artery: Secondary | ICD-10-CM

## 2010-07-16 DIAGNOSIS — D329 Benign neoplasm of meninges, unspecified: Secondary | ICD-10-CM | POA: Insufficient documentation

## 2010-07-16 DIAGNOSIS — R600 Localized edema: Secondary | ICD-10-CM | POA: Insufficient documentation

## 2010-07-16 DIAGNOSIS — Z87440 Personal history of urinary (tract) infections: Secondary | ICD-10-CM | POA: Insufficient documentation

## 2010-07-16 DIAGNOSIS — C8290 Follicular lymphoma, unspecified, unspecified site: Secondary | ICD-10-CM

## 2010-07-16 DIAGNOSIS — I10 Essential (primary) hypertension: Secondary | ICD-10-CM | POA: Insufficient documentation

## 2010-07-16 DIAGNOSIS — I619 Nontraumatic intracerebral hemorrhage, unspecified: Secondary | ICD-10-CM | POA: Insufficient documentation

## 2010-07-17 ENCOUNTER — Encounter: Payer: Self-pay | Admitting: Internal Medicine

## 2010-07-24 ENCOUNTER — Telehealth: Payer: Self-pay | Admitting: *Deleted

## 2010-07-24 NOTE — Letter (Signed)
Summary: [facility] Follow [facility]  [facility] Follow [facility]   Imported By: Maryln Gottron 07/20/2010 13:56:32  _____________________________________________________________________  External Attachment:    Type:   Image     Comment:   External Document

## 2010-07-24 NOTE — Letter (Signed)
Summary: [facility]  [facility]   Imported By: Kassie Mends 07/17/2010 09:14:58  _____________________________________________________________________  External Attachment:    Type:   Image     Comment:   External Document  Appended Document: [facility] result already reviewed

## 2010-07-24 NOTE — Telephone Encounter (Signed)
Pt 's daughter states that pt is having more pain in shoulders, neck and arms.  The doctor she had previously been seeing for this is no longer in practice.  [name] is asking what can be done to help with pt's pain.

## 2010-07-24 NOTE — Telephone Encounter (Signed)
Spoke with patient's daughter and she would start with Dr.Copland for and accessment only, pt doesn't want to get any injection on the first visit. Pt is scheduling appt with Dr.Copland.

## 2010-07-24 NOTE — Telephone Encounter (Signed)
Ask if she wants referral to another specialist Can consider seeing Dr Janne Napoleon is a sports medicine specialist---in our office

## 2010-07-25 NOTE — Telephone Encounter (Signed)
Pt. Already has appt. W/ Dr.Copland on 07/26/10.

## 2010-07-25 NOTE — Telephone Encounter (Signed)
Pt. already has appt. on 07/26/10 w/ Dr.Copland.

## 2010-07-25 NOTE — Telephone Encounter (Signed)
Please help them get set up with Dr Patsy Lager

## 2010-07-25 NOTE — Telephone Encounter (Signed)
Okay 

## 2010-07-26 ENCOUNTER — Encounter: Payer: Self-pay | Admitting: Family Medicine

## 2010-07-26 ENCOUNTER — Ambulatory Visit (INDEPENDENT_AMBULATORY_CARE_PROVIDER_SITE_OTHER): Payer: Medicare Other | Admitting: Family Medicine

## 2010-07-26 ENCOUNTER — Ambulatory Visit (INDEPENDENT_AMBULATORY_CARE_PROVIDER_SITE_OTHER)
Admission: RE | Admit: 2010-07-26 | Discharge: 2010-07-26 | Disposition: A | Discharge status: Home / Self Care | Payer: Medicare Other | Source: Ambulatory Visit | Attending: Family Medicine | Admitting: Family Medicine

## 2010-07-26 VITALS — BP 120/70 | HR 66 | Temp 98.7°F | Ht 62.0 in | Wt 275.8 lb

## 2010-07-26 DIAGNOSIS — G8929 Other chronic pain: Secondary | ICD-10-CM | POA: Insufficient documentation

## 2010-07-26 DIAGNOSIS — M542 Cervicalgia: Secondary | ICD-10-CM

## 2010-07-26 DIAGNOSIS — IMO0001 Reserved for inherently not codable concepts without codable children: Secondary | ICD-10-CM

## 2010-07-26 MED ORDER — PREGABALIN 75 MG PO CAPS: 75.0000 mg | ORAL_CAPSULE | Freq: Every day | ORAL | Status: DC

## 2010-07-26 NOTE — Assessment & Plan Note (Signed)
Complex chronic neck pain s/p prior 3 level fusion with chronic pain and fibromyalgia on chronic duragesic and oxycodone.  Recommendations: This is a very challenging case. Recheck plain films to insure no further movement near fusion. Certainly known, severe diffuse DDD on 2009 CT and difffuse spondyloarthropathy. Nerve impingement is certainly possible.  I tried to be frank with the patient. I think that conservative care and chronic pain management is the way to go with this patient.  We are going to start a trial of Lyrica. Also, I am going to help her get set up to get a TENS unit through PT. Length of need = 99 Failure, surgery, medications, PT, chiropractor. She is a poor surgical candidate, and I have fairly strongly counselled them to not seek out aggressive care. I think she is a fairly poor epidural or facet injection candidate given comorbidities.  Cc: Dr. Alphonsus Sias

## 2010-07-26 NOTE — Patient Instructions (Signed)
GO TO GET YOUR XRAY  STOP TO SEE MARION ABOUT PT AND TENS UNIT

## 2010-07-26 NOTE — Progress Notes (Signed)
77 year old female with a history of cervical fusion in 1985 presents as a consult from Dr. Alphonsus Sias for evaluation of neck pain:  History is significant for known DDD, fibromyalgia, and chronic pain on Fentanyl patches and oxycodone.  Prior history of cervical fusion.  History of CA without evidence of bony mets on recent PET scans.   Has had body aches and pains in the 1950's.  Quit in the 70's. Had a productive life at home.  Has had multiple eye problems.  Also complains of intinence, c/o some edema, c/o dentures.   Now had had 3-5 cervical levels fused. 1985. Went to the pain clinic at [facility], dx with fibromyalgia.  Went to [facility]) - went to seminar. Kyphoplasty. We discussed the role of kyphoplasty in acute vertebral fracture. (not her case)  Chronic neck pain for years: Pain in the neck. Will not aggrevate it.  Has pain at the base of her skull and pain in the upper aspects of her arm, head and arms. Pain down her arms.   Taking 4 oxycodone a day. Occ some tylenol and some glucosamine cream.   In the past, she was actually set up to have an epidural and discussed nerve root blockade by pain clinic at [facility], but ultimately d/c these procedures, when she was already on the fluoroscopic suite and ready for procedure.  No numbness, no radiculopathy.  CT scan from 2009 independently reviewed, full spinal viewed evaluated, showing prior cervical fusion with mild degree of anterolisthesis present, extensive DDD at c5-6 level, and diffuse, moderate - severe DDD at many levels with significant faced arthropathy in much of the spine.  REVIEW OF SYSTEMS  GEN: No fevers, chills. Nontoxic. Primarily MSK c/o today - other than the few things listed above. MSK: Detailed in the HPI GI: tolerating PO intake without difficulty Neuro: occaisionally with some tingling B UE. Otherwise, the pertinent positives and negatives are listed above and in the HPI, otherwise a full  review of systems has been reviewed and is negative unless noted positive.     The PMH, PSH, Social History, Family History, Medications, and allergies have been reviewed in [facility], and have been updated if relevant.   GEN: Well-developed,well-nourished,in no acute distress; alert,appropriate and cooperative throughout examination, Obese. HEENT: Normocephalic and atraumatic without obvious abnormalities. Ears, externally no deformities PULM: Breathing comfortably in no respiratory distress EXT: No clubbing, cyanosis, or edema PSYCH: Normally interactive. Cooperative during the interview. Pleasant. Friendly and conversant. Not anxious or depressed appearing. Normal, full affect.  CERVICAL SPINE EXAM Range of motion: Flexion, extension, lateral bending, and rotation: mild loss, approx 15% Pain with terminal motion: mild Spinous Processes: TTP c3-t1 SCM: TTP Upper paracervical muscles: TTP Upper traps: NT C5-T1 intact, sensation and motor DTR 2+ Grip 5/5 All other UE str 5/5

## 2010-07-27 NOTE — Progress Notes (Signed)
Patient advised.

## 2010-07-30 ENCOUNTER — Other Ambulatory Visit: Payer: Self-pay | Admitting: *Deleted

## 2010-07-30 MED ORDER — FENTANYL 25 MCG/HR TD PT72: 1.0000 | MEDICATED_PATCH | TRANSDERMAL | Status: DC

## 2010-07-30 MED ORDER — FENTANYL 12 MCG/HR TD PT72: 1.0000 | MEDICATED_PATCH | TRANSDERMAL | Status: DC

## 2010-07-30 MED ORDER — OXYCODONE HCL 5 MG PO TABS: 5.0000 mg | ORAL_TABLET | Freq: Four times a day (QID) | ORAL | Status: DC | PRN

## 2010-07-30 NOTE — Telephone Encounter (Signed)
Spoke with daughter and advised that rx's are ready for pick-up

## 2010-07-30 NOTE — Telephone Encounter (Signed)
Please notify.

## 2010-07-30 NOTE — Telephone Encounter (Signed)
Pt needs refills on oxycodone, fentanyl.  Please call [name] when ready.

## 2010-07-30 NOTE — Telephone Encounter (Signed)
Rx's written. 

## 2010-08-17 ENCOUNTER — Ambulatory Visit (INDEPENDENT_AMBULATORY_CARE_PROVIDER_SITE_OTHER): Payer: Medicare Other | Admitting: Cardiology

## 2010-08-17 ENCOUNTER — Encounter: Payer: Self-pay | Admitting: Cardiology

## 2010-08-17 DIAGNOSIS — I509 Heart failure, unspecified: Secondary | ICD-10-CM

## 2010-08-17 DIAGNOSIS — I10 Essential (primary) hypertension: Secondary | ICD-10-CM

## 2010-08-17 DIAGNOSIS — I5032 Chronic diastolic (congestive) heart failure: Secondary | ICD-10-CM

## 2010-08-17 DIAGNOSIS — E785 Hyperlipidemia, unspecified: Secondary | ICD-10-CM

## 2010-08-17 NOTE — Patient Instructions (Signed)
Your physician recommends that you return for a FASTING lipid profile: next week We will make a referral for PT. Your physician recommends that you schedule a follow-up appointment in: 6 months

## 2010-08-19 NOTE — Progress Notes (Signed)
PCP: Dr. Alphonsus Sias  77 yo with history of HTN, obesity, and chronic lower extremity edema presents for cardiology followup.  She has had leg swelling bilaterally for about 10 years.  She has been seen by vascular surgery in [place] and is thought to have a component of venous insufficiency. She cannot wear compression stockings (unable to put them on).  She has been short of breath after walking about 30 feet chronically.  However, she is also quite limited by foot pain.  She has been seeing a podiatrist about this.  She sleeps with 3 pillows at night, mainly for GERD rather than orthopnea.  No chest pain/tightness.  SBP at home has been running in the 120s-140s per patient, and BP is 138/80 today.  Weight is up another 5 lbs.  She is taking her torsemide daily now.    Labs (8/11): BNP 50 Labs (9/11): LDL 133, HDL 53, K 4.0, creatinine 0.8, LFTs normal, TSH normal Labs (10/11): K 4.1, creatinine 0.78 Labs (2/12): K 3.9, creatinine 0.81, BNP 85, calcium 11 Labs (3/12): PTH level elevated  Allergies (verified):  1)  ! Augmentin 2)  ! Cephalexin 3)  ! Ceftin 4)  ! Sulfa  Family History: Dad died @80  CVAs Mom died @91  myeloma Only child No CAD, DM HTN in family No cancer  Social History: Retired--farmed/taught  Lives in [place] Married   3 sons/1 daughter Quit smoking 50 years ago Alcohol use-no  Past Medical History: 1. Hypertension: Difficult to control.  2. Hypothyroidism 3. Osteoporosis 4. Fibromyalgia 5. Chronic lower extremity edema.  6. Cervical disk disease 7. Follicular lymphoma: Quiescent.  Dr Cherene Altes, Dr Elenore Rota 8. L frontal meningioma 9. GERD 10. Obesity 11. Cerebral hemorrhage: 2004, hypertensive.  12. H/o Urinary Tract Infection 13. Right renal artery aneurysm w/o stenosis, followed by Dr. Gilda Crease 14. Diastolic CHF: Echo (9/11) with EF 65-70%, mild LVH, mid AI, mild MR, PA systolic pressure > 40 mmHg.  15. Primary hyperparathyroidism  Current Outpatient  Prescriptions  Medication Sig Dispense Refill  . aliskiren (TEKTURNA) 300 MG tablet Take 300 mg by mouth daily.        Marland Kitchen amLODipine (NORVASC) 10 MG tablet Take 10 mg by mouth daily.        Marland Kitchen aspirin 81 MG tablet Take 81 mg by mouth daily.        . carvedilol (COREG) 25 MG tablet Take 25 mg by mouth 2 (two) times daily with a meal.        . Casanthranol-Docusate Sodium (LAXATIVE PLUS STOOL SOFTENER) 30-100 MG CAPS Take 1 capsule by mouth 4 (four) times daily as needed.        . fentaNYL (DURAGESIC - DOSED MCG/HR) 12 MCG/HR Place 1 patch (12.5 mcg total) onto the skin every 3 (three) days.  10 patch  0  . fentaNYL (DURAGESIC - DOSED MCG/HR) 25 MCG/HR Place 1 patch (25 mcg total) onto the skin every 3 (three) days.  10 patch  0  . Liotrix (THYROLAR-1/4) 15 (3.1-12.5) MG (MCG) TABS Take 1 tablet by mouth daily.       Marland Kitchen lisinopril (PRINIVIL,ZESTRIL) 40 MG tablet Take 40 mg by mouth daily.        . Lutein-Zeaxanthin 6-1 MG TABS Take 1 tablet by mouth daily.        . Melatonin 3 MG CAPS Take 1 capsule by mouth daily.        Marland Kitchen nystatin (MYCOSTATIN) cream Apply 1 application topically 3 (three) times daily as  needed.        Marland Kitchen oxyCODONE (OXY IR/ROXICODONE) 5 MG immediate release tablet Take 1 tablet (5 mg total) by mouth every 6 (six) hours as needed.  120 tablet  0  . potassium chloride SA (K-DUR,KLOR-CON) 20 MEQ tablet Take 20 mEq by mouth daily.        . Probiotic Product (ACIDOPHILUS PROBIOTIC BLEND) CAPS Take 1 capsule by mouth daily.        Lyman Bishop Protein Strong POWD by Does not apply route as needed.        . torsemide (DEMADEX) 20 MG tablet Take 20 mg by mouth daily.        Gerarda Fraction Root 450 MG CAPS Take 1 capsule by mouth daily.        . vitamin E 400 UNIT capsule Take 400 Units by mouth daily.        . Cholecalciferol (VITAMIN D3) 5000 UNIT/ML LIQD Take by mouth as directed.        . pregabalin (LYRICA) 75 MG capsule Take 1 capsule (75 mg total) by mouth at bedtime.  30 capsule  2    BP  138/80  Pulse 64  Ht 5' 2.5" (1.588 m)  Wt 276 lb (125.193 kg)  BMI 49.68 kg/m2 General:  Well developed, well nourished, in no acute distress.  Obese. Neck:  Neck supple, JVP not elevated. No masses, thyromegaly or abnormal cervical nodes. Lungs:  Clear bilaterally to auscultation and percussion. Heart:  Non-displaced PMI, chest non-tender; regular rate and rhythm, S1, S2 without rubs or gallops. 2/6 SEM RUSB.  Carotid upstroke normal, no bruit.  2+ edema to thighs bilaterally. Abdomen:  Bowel sounds positive; abdomen soft and non-tender without masses, organomegaly, or hernias noted. No hepatosplenomegaly. Extremities:  No clubbing or cyanosis. Neurologic:  Alert and oriented x 3. Psych:  Normal affect.

## 2010-08-19 NOTE — Assessment & Plan Note (Addendum)
SIgnificant lower extremity edema is likely due to a combination of diastolic CHF and venous insufficiency.  She is taking her torsemide daily.  She is unable to put on compression stockings.  Neck veins are not significantly elevated today.  Will continue current torsemide dose.  She is going to watch her sodium intake carefully.   I will get lipids.

## 2010-08-19 NOTE — Assessment & Plan Note (Signed)
BP is under good control.  Continue current medications.  

## 2010-08-27 ENCOUNTER — Other Ambulatory Visit: Payer: Self-pay | Admitting: *Deleted

## 2010-08-28 MED ORDER — FENTANYL 25 MCG/HR TD PT72: 1.0000 | MEDICATED_PATCH | TRANSDERMAL | Status: DC

## 2010-08-28 MED ORDER — OXYCODONE HCL 5 MG PO TABS: 5.0000 mg | ORAL_TABLET | Freq: Four times a day (QID) | ORAL | Status: DC | PRN

## 2010-08-28 NOTE — Telephone Encounter (Signed)
Spoke with patient and advised results   

## 2010-09-06 ENCOUNTER — Other Ambulatory Visit (INDEPENDENT_AMBULATORY_CARE_PROVIDER_SITE_OTHER): Payer: Medicare Other | Admitting: *Deleted

## 2010-09-06 ENCOUNTER — Telehealth: Payer: Self-pay | Admitting: *Deleted

## 2010-09-06 DIAGNOSIS — E785 Hyperlipidemia, unspecified: Secondary | ICD-10-CM

## 2010-09-06 MED ORDER — CROMOLYN SODIUM 5.2 MG/ACT NA AERS: 2.0000 | INHALATION_SPRAY | Freq: Every day | NASAL | Status: DC

## 2010-09-06 NOTE — Telephone Encounter (Signed)
Patient's daughter called back and she says that she does not want the hose she wants the boot, it was recommended by [facility]. There fax number is [phone].

## 2010-09-06 NOTE — Telephone Encounter (Signed)
Okay to order the nasalcrom (cromyln sodium--  I think .03%) 2 sprays each nostril daily Okay x 1 year  Not sure what she means by compression boots---- compression hose???

## 2010-09-06 NOTE — Telephone Encounter (Signed)
Spoke with daughter and asked what are the compression boots? She states they are gel like things that wrap around the legs that gently massage the leg and get the fluid out? Like the ones in the hospital that keeps the circulation going. Daughter states that pt goes to [facility] in [place] phone number [phone] fax# [phone], physical therapist name is Storm Frisk.

## 2010-09-06 NOTE — Telephone Encounter (Signed)
Patient's daughter called requesting refill for Nasalcrom. I didn't see it on her med list. She is also asking if a rx can be faxed to [facility] for compression boots.

## 2010-09-07 LAB — LIPID PANEL
HDL: 52 mg/dL (ref >39)
LDL Cholesterol: 127 mg/dL — ABNORMAL HIGH (ref 0–99)
VLDL: 18 mg/dL (ref 0–40)

## 2010-09-07 LAB — HEPATIC FUNCTION PANEL
Albumin: 4.3 g/dL (ref 3.5–5.2)
Alkaline Phosphatase: 53 U/L (ref 39–117)
Total Bilirubin: 0.6 mg/dL (ref 0.3–1.2)

## 2010-09-07 NOTE — Telephone Encounter (Signed)
I will write for them but if they have a pump, I am not sure they will be covered

## 2010-09-07 NOTE — Telephone Encounter (Signed)
Patient advised as instructed via telephone.  Order faxed to Fayrene Fearing at [phone].

## 2010-09-10 ENCOUNTER — Telehealth: Payer: Self-pay | Admitting: *Deleted

## 2010-09-10 MED ORDER — CARVEDILOL 25 MG PO TABS: 25.0000 mg | ORAL_TABLET | Freq: Two times a day (BID) | ORAL | Status: DC

## 2010-09-10 NOTE — Telephone Encounter (Signed)
Pt requested 90 day supply for Carvedilol be sent to [facility].

## 2010-09-11 ENCOUNTER — Ambulatory Visit (INDEPENDENT_AMBULATORY_CARE_PROVIDER_SITE_OTHER): Payer: Medicare Other | Admitting: Internal Medicine

## 2010-09-11 ENCOUNTER — Telehealth: Payer: Self-pay | Admitting: *Deleted

## 2010-09-11 ENCOUNTER — Encounter: Payer: Self-pay | Admitting: Internal Medicine

## 2010-09-11 VITALS — BP 140/80 | HR 82 | Temp 98.5°F | Ht 62.0 in | Wt 279.0 lb

## 2010-09-11 DIAGNOSIS — R609 Edema, unspecified: Secondary | ICD-10-CM

## 2010-09-11 DIAGNOSIS — M5030 Other cervical disc degeneration, unspecified cervical region: Secondary | ICD-10-CM

## 2010-09-11 DIAGNOSIS — I5032 Chronic diastolic (congestive) heart failure: Secondary | ICD-10-CM

## 2010-09-11 DIAGNOSIS — E039 Hypothyroidism, unspecified: Secondary | ICD-10-CM

## 2010-09-11 DIAGNOSIS — I509 Heart failure, unspecified: Secondary | ICD-10-CM

## 2010-09-11 DIAGNOSIS — I10 Essential (primary) hypertension: Secondary | ICD-10-CM

## 2010-09-11 NOTE — Progress Notes (Signed)
Subjective:    Patient ID: Kayla [name], female    DOB: Feb 05, 1924, 77 y.o.   MRN: [id number]  HPI Saw Dr Patsy Lager Decided not to take lyrica due to potential side effects Just continuing on her pain regimen  Now in PT for her legs Increasing strength, etc  Ongoing edema--worse now Weight is up 20# or so Trying to qualify for compression pump for the edema Occ SOB if she pushes it  Occ gets stomach upset--gets "full" feeling No particular time Eats small meals frequently Trying to eat the right things Trying to avoid gluten  Ongoing issues with incontinence No real improvement  Stopped injection therapy in left eye Wasn't helping that much but became static Very emotionally trying to go there  Current outpatient prescriptions:aliskiren (TEKTURNA) 300 MG tablet, Take 300 mg by mouth daily.  , Disp: , Rfl: ;  amLODipine (NORVASC) 10 MG tablet, Take 10 mg by mouth daily.  , Disp: , Rfl: ;  aspirin 81 MG tablet, Take 81 mg by mouth daily.  , Disp: , Rfl: ;  carvedilol (COREG) 25 MG tablet, Take 1 tablet (25 mg total) by mouth 2 (two) times daily with a meal., Disp: 180 tablet, Rfl: 3 Casanthranol-Docusate Sodium (LAXATIVE PLUS STOOL SOFTENER) 30-100 MG CAPS, Take 1 capsule by mouth 4 (four) times daily as needed.  , Disp: , Rfl: ;  cromolyn (NASALCROM) 5.2 MG/ACT nasal spray, 2 sprays by Nasal route daily., Disp: 180 Act, Rfl: 3;  fentaNYL (DURAGESIC - DOSED MCG/HR) 25 MCG/HR, Place 1 patch (25 mcg total) onto the skin every 3 (three) days., Disp: 10 patch, Rfl: 0 Liotrix (THYROLAR-1/4) 15 (3.1-12.5) MG (MCG) TABS, Take 1 tablet by mouth daily. , Disp: , Rfl: ;  lisinopril (PRINIVIL,ZESTRIL) 40 MG tablet, Take 40 mg by mouth daily.  , Disp: , Rfl: ;  Lutein-Zeaxanthin 6-1 MG TABS, Take 1 tablet by mouth daily.  , Disp: , Rfl: ;  Melatonin 3 MG CAPS, Take 1 capsule by mouth daily.  , Disp: , Rfl: ;  nystatin (MYCOSTATIN) cream, Apply 1 application topically 3 (three) times daily as needed.   , Disp: , Rfl:  oxyCODONE (OXY IR/ROXICODONE) 5 MG immediate release tablet, Take 1 tablet (5 mg total) by mouth every 6 (six) hours as needed., Disp: 120 tablet, Rfl: 0;  potassium chloride SA (K-DUR,KLOR-CON) 20 MEQ tablet, Take 20 mEq by mouth daily.  , Disp: , Rfl: ;  pregabalin (LYRICA) 75 MG capsule, Take 1 capsule (75 mg total) by mouth at bedtime., Disp: 30 capsule, Rfl: 2 Probiotic Product (ACIDOPHILUS PROBIOTIC BLEND) CAPS, Take 1 capsule by mouth daily.  , Disp: , Rfl: ;  Silver Protein Strong POWD, by Does not apply route as needed.  , Disp: , Rfl: ;  torsemide (DEMADEX) 20 MG tablet, Take 20 mg by mouth daily.  , Disp: , Rfl: ;  Valerian Root 450 MG CAPS, Take 1 capsule by mouth daily.  , Disp: , Rfl: ;  vitamin E 400 UNIT capsule, Take 400 Units by mouth daily.  , Disp: , Rfl:  DISCONTD: Cholecalciferol (VITAMIN D3) 5000 UNIT/ML LIQD, Take by mouth as directed.  , Disp: , Rfl: ;  DISCONTD: fentaNYL (DURAGESIC - DOSED MCG/HR) 12 MCG/HR, Place 1 patch (12.5 mcg total) onto the skin every 3 (three) days., Disp: 10 patch, Rfl: 0  Past Medical History  Diagnosis Date  . HTN (hypertension)     difficult to control  . Hypothyroidism   . OP (osteoporosis)   .  Fibromyalgia   . Edema of both legs     chronic  . Degenerative disk disease     cervical  . Follicular lymphoma     Quiescent. Dr. Sunday Corn; Dr. Elenore Rota  . Meningioma     Left frontal  . GERD (gastroesophageal reflux disease)   . Obesity   . Cerebral hemorrhage 2004    hypertensive  . Hx: UTI (urinary tract infection)   . Renal artery aneurysm     with stenosis-right; followed by Dr. Gilda Crease  . Diastolic CHF, chronic     Echo (9-11) with EF 65-70%, mild LVH, mid AI, Mild MR, PA systolic pressure >    Past Surgical History  Procedure Date  . Cataract extraction 10/05 and 4/07    Left then Right [facility])  . Abdominal hysterectomy 1958  . Cerebral hemorrhage 10/2002  . Cervical fusion 1985  . Rectocele repair  1982  . Cystocele repair 1982    Family History  Problem Relation Age of Onset  . Stroke Father   . Cancer Mother     Myeloma  . Hypertension      family    History   Social History  . Marital Status: Married    Spouse Name: N/A    Number of Children: N/A  . Years of Education: N/A   Occupational History  . Retired- Scientist, clinical (histocompatibility and immunogenetics) and taught    Social History Main Topics  . Smoking status: Former Smoker    Types: Cigars  . Smokeless tobacco: Never Used   Comment: Quit smoking 50 years ago  . Alcohol Use: No  . Drug Use: Not on file  . Sexually Active: Not on file   Other Topics Concern  . Not on file   Social History Narrative  . No narrative on file   Review of Systems Mood is generally okay Not depressed Sleeps 2-3 hours--then wakes up. Is able to get back to sleep. Nocturia is main issue     Objective:   Physical Exam  Constitutional: She appears well-developed and well-nourished. No distress.  Neck: Normal range of motion. Neck supple.  Cardiovascular: Normal rate, regular rhythm and normal heart sounds.  Exam reveals no gallop.   No murmur heard. Pulmonary/Chest: Effort normal and breath sounds normal. No respiratory distress. She has no wheezes. She has no rales.  Abdominal: Soft. There is no tenderness.  Musculoskeletal: She exhibits edema. She exhibits no tenderness.       4+ tense edema in calves  Lymphadenopathy:    She has no cervical adenopathy.  Psychiatric: She has a normal mood and affect. Her behavior is normal. Judgment and thought content normal.          Assessment & Plan:

## 2010-09-11 NOTE — Telephone Encounter (Signed)
Opened in error

## 2010-09-18 NOTE — Assessment & Plan Note (Signed)
Kayla [name], Kayla [name]                  ACCOUNT NO.:  [id number]   MEDICAL RECORD NO.:  [id number]          PATIENT TYPE:  POB   LOCATION:  [facility] at [facility]         FACILITY:  [facility]   PHYSICIAN:  Tinnie Gens, MD        DATE OF BIRTH:  08-13-1923   DATE OF SERVICE:  03/08/2009                                  CLINIC NOTE   CHIEF COMPLAINT:  Incontinence.   HISTORY OF PRESENT ILLNESS:  The patient is an 77 year old gravida 5,  para 4-0-1-4, who is referred in from [facility] for incontinence.  The  patient reports she has had several bladder surgeries in the past, but  has been incontinent most of her urine over the last 2 years.  She  reports she gets no warning and that she does not lose her urine with  coughing or sneezing, but is just suddenly wet.  Most of the time during  the day, she reports wearing 3 pads at a time to ensure dryness.  She is  not presently sexually active, although she remains married and has been  for 63 years.  The patient has undergone hysterectomy in the past for  anterior and posterior repair.  She also had a bladder suspension in  either 1980 or 1990 at [facility], although she is  unclear about the exact nature of this, but that seem to take care of  the problem.  The patient reports that over the last 6 months, she has  had increasing trouble with urinary tract infections and has had to have  antibiotics prescribed.  She complains of dysuria today.  Her daughter  is with her today and does fact checking for her, although the patient  is fairly a good historian.   PAST MEDICAL HISTORY:  Significant for arthritis, fibromyalgia,  hypertension, thyroid disease, stroke.   Surgical history is very lengthy.  It includes tonsils and  adenoidectomy, hysterectomy and anterior posterior repair, Bartholin  gland on the left, inguinal hernia repair, sinus surgery, bladder  repair, fusion of 3 cervical vertebrae, cataract on the left and the  right,  then blood vessel reduction.   MEDICATIONS:  1. Fentanyl patch 25 mcg every 3 days.  2. Thyrolar 60 mcg.  3. Lisinopril 40 mg daily.  4. Diltiazem 180 mg ER twice daily.  5. Oxycodone IR 5 mg typically 3 daily.  6. Torsemide 20 mg 2 daily.  7. Klor-Con 20 mEq half to one daily.  8. Metoprolol 200 extended release once daily.  9. Phenergan suppositories 25 mg as needed.  10.Nystatin cream applied to the affected area 3 times daily.  11.Nexium 40 mg once daily.   Over-the-counter medications include equate stool softener with  laxative, sennosides, and Colace all taken twice daily, acetaminophen  500 mg 1-2 as needed, Zyrtec 1 daily as needed, dimenhydrinate 50 mg 1-2  as needed, and Excedrin PM 1-2 as needed.   Subsequent to that, she is on N-acetylcysteine 600 mg twice daily,  vitamin E with calciferol 400 international units once daily, probiotic  acidophilus 10 mg once daily, melatonin 3 mg once daily.  Chewable  probiotic acidophilus 90 mg/25 mg  3 daily, vitamin D 5000 international  units daily, once daily, and valerian root once daily.   FAMILY HISTORY:  Hypertension and melanoma in her mother.   SOCIAL HISTORY:  The patient is a retired Software engineer.  She lives  with her husband and her daughter.  She denies tobacco, alcohol, or drug  use.   Fourteen-point review of system review is positive for swelling in her  legs, some muscle aches, fatigue, problems with her vision, and  occasional nausea and vomiting and as in the HPI.   PHYSICAL EXAMINATION:  VITAL SIGNS:  As noted in the chart.  Notably,  her blood pressure is 180/88, her weight is 263 pounds.  Pulse is 56.  GENERAL:  She is a morbidly obese female in no acute distress.  ABDOMEN:  Pendulous, was soft and nontender.  GU:  Normal external female genitalia.  Vagina is pale with loss of  rugation.  The bladder and rectum appear to be well supported.  I think  she has minimal bit of vaginal vault prolapse.  The  uterus is absent and  the adnexa cannot be adequately felt.   IMPRESSION:  Incontinence.  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  We will refer her to Dr. Sherron Monday to see if  there is anything he has to offer that might be better and let her get  full urodynamic testing just to be sure there is nothing else we can do.  I have given the patient information regarding Detrol or Ditropan and  its possible side effects and let her made the decision about whether  she would want to be on that.  Additionally, urine is sent for culture  today.  She had a trace leukocytes and trace blood in her culture today.   Thank you for referring this patient.  As always, we will keep you  informed as to her progress and care.           ______________________________  Tinnie Gens, MD     TP/MEDQ  D:  03/08/2009  T:  03/09/2009  Job:  [id number]   cc:   Dr. Sherron Monday  [facility]

## 2010-09-18 NOTE — Procedures (Signed)
Kayla, [name]                  ACCOUNT NO.:  [id number]   MEDICAL RECORD NO.:  [id number]          PATIENT TYPE:  REC   LOCATION:  [facility]                          FACILITY:  [facility]   PHYSICIAN:  Erick Colace, M.D.DATE OF BIRTH:  1924/03/22   DATE OF PROCEDURE:  11/25/2006  DATE OF DISCHARGE:                               OPERATIVE REPORT   PROCEDURE PERFORMED:  Trigger point injection.   REASON FOR PROCEDURE:  Myofascial pain only partially responsive to  narcotic analgesic medications with prior relief of up to one month with  trigger point injections.   Informed consent was obtained after describing the risks and benefits of  the procedure to the patient.  These include bleeding, bruising,  infection and she elects to proceed.  Three areas on the left upper trap  were marked and prepped with Betadine as well as one area on the right  upper trap.  They were entered with a 25 gauge 1-1/2-inchneedle with  positive twitch sign seen on the left side;  0.5 mL of 1% lidocaine  injected each site with trigger point deactivation.  The patient  tolerated the procedure well.  Post injection instructions given.      Erick Colace, M.D.  Electronically Signed     AEK/MEDQ  D:  11/25/2006 17:07:32  T:  11/26/2006 10:18:50  Job:  [id number]   cc:   Karie Schwalbe, MD  [address] [place], [place] [address]

## 2010-09-18 NOTE — Group Therapy Note (Signed)
Kayla [name] is an 77 year old female who has had neck pain dating back to  the 6s.  She had an anterior cervical decompression and fusion which  she thinks were C4, 5 and 6 levels in 1985.  She, however, has had  varying degrees of neck pain, back pain, headache pain and shoulder pain  over the ensuing two decades and has seen several pain specialists along  the way.  She has in the past gone to Dr. Elvin So who started out in  [place] at [facility], but moved to private practice to  [place].  She has what sounded like trigger point injections  performed by the physician's assistant that were of good benefit to her.  Because of the distance from [place] to [place] she tried going to  Dr. Metta Clines in [place], however, she felt that whatever injections he  performed were not as helpful as the trigger point injections.  She has  no records from Dr. Letta Moynahan office.  She thinks she started seeing him  about 8 years ago.  There was some talk of burning the nerves in her  neck which she was apprehensive about.   Her main goal is to look for other alternatives and treatments.   PAST MEDICAL HISTORY:  1. Significant for non-Hodgkin lymphoma and has had close follow up      with the cancer center in regards to this.  She believes she is      cancer-free and has not had any treatment specifically for this,      but has had numerous nuclear medicine PET scan imaging studies as      well as CT's of the chest, abdomen, pelvis.  2. Hypertension.  3. Anxiety.   MEDICATIONS:  1. Duragesic patch 25 mcg q.3 days.  2. Oxycodone 5 mg.  She takes an average of two a day.  These are her      main pain medicines.  3. Levothyroxine.  4. Avapro.  5. Cardizem.  6. Torsemide.  7. Klor-Con.  8. Lorazepam which is incidentally 0.5 mg a day.  9. Toprol.  10.__________pram.  02.RKYHCWC.  12.Thyrolar.  13.Several supplements and over-the-counter medications.   SOCIAL HISTORY:  She lives with  her daughter who is now managing her  medications.  Her daughter lives with her and her husband and helps with  household duties and shopping.   REVIEW OF SYSTEMS:  She has had some bladder control problems, tremor,  tingling, dizziness, nausea, constipation and has had chronic lower  extremity swelling and has had lymphedema massage treatment from a  physical therapist here in [place] which has been helpful, but she  did not keep up with this or keep up with the wrappings so that her legs  have swollen up again.   FAMILY HISTORY:  Positive for high blood pressure.   PHYSICAL EXAMINATION:  An elderly obese female in no acute distress.  Her blood pressure is 153/80, pulse 70, respirations 18, O2 saturation  90% on room air.  She has no acute distress.  Her respirations are  unlabored at this time.  She has 3+ edema bilateral pretibial area going  down to the ankle area.  She has no evidence of open areas on her lower  extremities and only mild stasis dermatitis changes.  Her upper  extremity strength is normal.  Lower extremity strength is graded at  4/5.  She ambulates with short step length and widened base support.  This is partially related to  her lower extremity girth.   Her neck has 75% range toward the right and 50% range toward the left.  She has 50% range with forward flexion and extension.  She has  tenderness to palpation mainly in the left upper trapezius area greater  than right side.  Sensation is normal to Frey hair testing.   IMPRESSION:  1. Cervical post laminectomy syndrome with chronic myofascial pain and      probable cervical facet arthropathy above and below the level of      fusion, i.e. C3-4 and C6-7.  2. Lymphedema.   PLAN:  1. Would recommend continue on trigger point injections and will      perform some today.  These have been beneficial for her in the      past.  2. Would like to obtain further records on Dr. Letta Moynahan procedures to      see exactly  what he did, whether they were medial branch blocks      because she may indeed benefit from cervical medial branch blocks      and possibly cervical radiofrequency neurotomy.  3. From a medication standpoint she seems well managed at this point.      She would like to further reduce her narcotic analgesic medications      if possible and I think this would be reasonable.  The next step      would be to go down to 12 mcg of Duragesic patch.  4. Further cervical procedures other than trigger point injections are      contemplated. Would need to repeat MRI of the cervical spine.  5. In regards to her lymphedema as well as lower extremity strength      problems and balance problems would recommend further physical      therapy treatment and have made a referral to the [facility]      therapist that does most of the lymphedema treatment.   Thank you for this interesting consultation.      Erick Colace, M.D.  Electronically Signed     AEK/MedQ  D:  11/25/2006 16:52:24  T:  11/26/2006 09:34:02  Job #:  [id number]   cc:   Karie Schwalbe, MD  [address] [place], [place] [address]   Perlie Gold  Fax: [phone]

## 2010-09-18 NOTE — Procedures (Signed)
NAMETAKERRA, Kayla [name]                  ACCOUNT NO.:  [id number]   MEDICAL RECORD NO.:  [id number]          PATIENT TYPE:  OUT   LOCATION:  XRAY                         FACILITY:  [facility]   PHYSICIAN:  Erick Colace, M.D.DATE OF BIRTH:  Feb 20, 1924   DATE OF PROCEDURE:  DATE OF DISCHARGE:  12/09/2006                               OPERATIVE REPORT   PROCEDURE PERFORMED:  Trigger point injection bilateral upper trapezius,  bilateral upper medial scapula border and the levator scapula muscle.   Area marked and prepped with Betadine, entered with 27 gauge 5/8 inch  needle.  0.5 mL of 1% lidocaine injected into each side.  Patient  tolerated the procedure well.  Post injection instructions given.      Erick Colace, M.D.  Electronically Signed     AEK/MEDQ  D:  12/23/2006 15:19:37  T:  12/24/2006 11:23:20  Job:  [id number]

## 2010-09-21 NOTE — Discharge Summary (Signed)
Kayla [name], Kayla [name]                            ACCOUNT NO.:  [id number]   MEDICAL RECORD NO.:  [id number]                   PATIENT TYPE:  INP   LOCATION:  2037                                 FACILITY:  [facility]   PHYSICIAN:  Rollene Rotunda, M.D.                DATE OF BIRTH:  04/29/1924   DATE OF ADMISSION:  10/20/2003  DATE OF DISCHARGE:  10/21/2003                           DISCHARGE SUMMARY - REFERRING   DISCHARGE DIAGNOSES:  1. Tachycardia.  2. Hypertension, treated.  3. Hypothyroidism, treated.  4. History of cerebrovascular accident secondary to cerebral hemorrhage     secondary to hypertension.  5. Fibromyalgia.  6. Depression/anxiety.  7. Gastroesophageal reflux disease.  8. Right renal artery aneurysm, 16 mm.  9. Status post hysterectomy.  10.      Cervical disc disease.   HOSPITAL COURSE:  [name] is a 77 year old female who was admitted on October 20, 2003 with a sinus tachycardia.  She felt nervous inside and felt that  her pulse was irregularity.  She called the office and was referred to the  emergency room.  She did complain of shortness of breath with exertion over  the past two weeks but there was no shortness of breath with the day of  admission episode.  She also has had some nonspecific fatigue over the past  few weeks.   Chest x-ray reveals chronic changes but no CHF or active disease.  D-dimer  was negative.  Cardiac markers were negative.  Total cholesterol of 186,  triglycerides of 188, HDL 40, LDL 110.  TSH 2.299.   The patient was initially started on Cardizem through the IV formation but  was quickly switched over to oral Cardizem.  In addition, a 2-D  echocardiogram was performed that was essentially normal.   She was monitored overnight and felt to be stable for discharge to home the  following day.  She was discharged to home on the following medications.   DISCHARGE MEDICATIONS:  1. Cardizem CD 120 mg b.i.d.  2. Avapro 300 mg q.d.  3. She is  to stop her Norvasc and Wytensin.  4. She is to continue all other medications as prior to admission including     Toprol XL 200 mg q.d.  5. Demodex 20 mg b.i.d.  6. Lorazepam p.r.n.  7. Calcium daily.  8. Duragesic patch.  9. Baclofen.  10.      Potassium.  11.      Oxycodone p.r.n.  12.      Phenergan p.r.n.  13.      Mucinex p.r.n.  14.      ____________ 60 gm q.d.   DISCHARGE INSTRUCTIONS:  She is to call if she has any further problems.  She needs to remain on a low fat, no caffeine diet.  Office will call her  for a follow-up appointment.      Guy Franco, P.A.  [facility]                      Rollene Rotunda, M.D.    Arlana Hove  D:  12/01/2003  T:  12/01/2003  Job:  [id number]   cc:   Demaris Callander, M.D.   Rollene Rotunda, M.D.

## 2010-09-21 NOTE — Assessment & Plan Note (Signed)
[facility]                                 ON-CALL NOTE   Kayla [name], Kayla [name]                         MRN:          [id number]  DATE:08/15/2007                            DOB:          27-Oct-1923    Caller is Glenard Haring, daughter.  Phone number [phone].   PRIMARY CARE PHYSICIAN:  Karie Schwalbe, MD   SUBJECTIVE:  Two days of pain in the left calf.  No erythema, no  swelling, moreso than in both legs, chronically.  No fall and injury.  No fever, chills.  No shortness of breath.   ASSESSMENT AND PLAN:  Pain in the left leg, unknown etiology.  Does not  sound like a clot.  If symptoms clot develop she will be evaluated at an  urgent care.  Otherwise she will use ibuprofen p.r.n. pain and follow  with a regular doctor on Monday.     Kerby Nora, MD  Electronically Signed    AB/MedQ  DD: 08/15/2007  DT: 08/15/2007  Job #: [id number]

## 2010-09-21 NOTE — Discharge Summary (Signed)
[name], [name]                            ACCOUNT NO.:  [id number]   MEDICAL RECORD NO.:  [id number]                   PATIENT TYPE:  INP   LOCATION:  4739                                 FACILITY:  [facility]   PHYSICIAN:  Rene Paci, M.D. [facility]          DATE OF BIRTH:  1923-08-04   DATE OF ADMISSION:  10/07/2002  DATE OF DISCHARGE:                                 DISCHARGE SUMMARY   DISCHARGE DIAGNOSES:  1. Hemorrhagic cerebrovascular accident with focal hematoma into the right     lenticular nucleus consistent with hypertensive hemorrhage.  2. Hypertensive crisis, poorly controlled at baseline, medically improved.  3. History of hypothyroidism.  Normal TFTs.  Thyrolar on hold.  4. Chronic pain syndrome.  5. Depression and anxiety.  6. Gastroesophageal reflux disease.  7. Incidental right renal artery aneurysm detected on CT June 7 measuring 16     mm in the right hilum.  Recommend vascular intervention as other     abdominal aneurysms.   DISCHARGE MEDICATIONS:  1. Avapro 300 mg p.o. daily.  2. Demadex 20 mg p.o. daily.  3. Toprol XL 200 mg p.o. daily.  4. K-Dur 20 mEq two tablets p.o. daily.  5. Norvasc 5 mg p.o. daily.  6. Wytensin 8 mg two tablets p.o. b.i.d.  7. Zoloft 50 mg p.o. daily.  8. Pepcid 20 mg p.o. daily.  9. Fentanyl 75 mcg patch q.72h.  10.      Oxycodone IR 5 mg p.o. q.6h. p.r.n.  11.      Ativan 1 mg p.o. q.6h. p.r.n.  12.      Sonata 10 mg p.o. q.h.s. p.r.n.  13.      Baclofen p.r.n.  14.      Meclizine p.r.n.  15.      Please note, patient is to discontinue her Thyrolar 60 mg daily     until seen by Dr. Elease Hashimoto and recommended otherwise.  16.      The patient is also to continue holding her Plavix and aspirin     until Thursday, June 24, three weeks after the occurrence of her     hypertensive crisis to prevent rebleed injury.  After Thursday, June 24     she may resume her aspirin/Plavix for stroke prevention as prior.   FOLLOW UP:  The  patient has follow-up appointments scheduled with her  primary care physician, Kayla [name], M.D. in [place] for Wednesday,  June 16 3:30 p.m.  A phone message has also been called in today to nursing  reception at her office regarding hospitalization and the need for follow-  up.   HOSPITAL COURSE:  Problem 1 - HYPERTENSIVE EMERGENCY:  The patient is a 77-  year-old white female who presented to the emergency room complaining of  paraesthesias and ataxia less than 24-hour onset prior to presentation.  She  has been seen a [facility] as an outpatient on June  2 where her blood  pressure was elevated with systolic greater than 200.  From there she was  referred to her primary care physician who referred her to the [facility].  Work-up in the emergency room was negative and she  was sent home.  However, because of persisting and worsening paraesthesias  and possible facial droop, her family brought her to [facility] for further  evaluation.  On evaluation in the emergency room her reports of weakness,  paraesthesias, dysarthria had all resolved.  Head CT was performed and  showed right basilar ganglia hemorrhage with moderate to marked white matter  changes consistent with small vessel disease and a left calcified frontal  meningioma without mass effect.  She was admitted for her hypertensive  crisis bleed for blood pressure control management.  MRI and MRA were  obtained to further evaluate, confirming the above findings.  Neurology  consult was obtained.  She recommended only to continue management of  hypertensive control and to hold aspirin and Plavix for three weeks  following the bleed.  Compliance with her antihypertensive medications was  strongly recommended.  The patient underwent an abdominal CT angiogram to  rule out renal artery stenosis as possible cause of her poorly controlled  hypertension.  No renal artery stenosis was found but an incidental  aneurysm  was.  Please see problem number next.  At the time of discharge patient's  blood pressure is better controlled with a systolic pressure in the 140s-  150s, diastolic 70s-80s.  Medical management will be as described above.  The patient understands needs for close follow-up and return to the ER  should symptoms recur or worsen.   Problem 2 - INCIDENTAL RIGHT RENAL ARTERY ANEURYSM:  As stated above, CT  confirmed this finding.  Radiology discussed this finding with me and  recommended vascular surgery follow-up on an outpatient basis as would be  appropriate for other abdominal aneurysms.  This is not a medical emergency  and will be referred to follow up by her primary care physician to make  these referrals.  A message has been left to this regard.   Problem 3 - HISTORY OF HYPOTHYROIDISM:  The patient reportedly was on  Thyrolar thyroid replacement prior to admission and says that she had simply  forgotten to take this medication for some time.  TFTs were checked during  this hospitalization and all were within normal limits with a TSH of 3.88  and a free T4 of 0.89 which is at low limits of normal.  Her Thyrolar had  been continued during the several days of this hospitalization, thus it is  anticipated that the patient may not need this medication if she truly was  not taking it and she has normal TFTs.  She is to hold her thyroid  replacement until further evaluation by her primary care physician as this  is unclear at time of discharge from [facility].   Problem 4 - CHRONIC PAIN AND ANXIETY:  The patient's family had great  concern over the amount of medications patient was taking.  No changes in  the approach to medical management of these conditions were changed during  this hospitalization.  This is deferred to her primary care Kayla [name] as  prior to admission.  Rene Paci, M.D. [facility]   VL/MEDQ  D:   10/12/2002  T:  10/12/2002  Job:  [id number]   cc:   Kayla [name]  [address]  [place]  [place] [address]  Fax: [phone]

## 2010-09-21 NOTE — H&P (Signed)
[name], [name]                            ACCOUNT NO.:  [id number]   MEDICAL RECORD NO.:  [id number]                   PATIENT TYPE:  INP   LOCATION:  2037                                 FACILITY:  [facility]   PHYSICIAN:  Rollene Rotunda, M.D.                DATE OF BIRTH:  June 19, 1923   DATE OF ADMISSION:  10/20/2003  DATE OF DISCHARGE:                                HISTORY & PHYSICAL   PRIMARY CARE PHYSICIAN:  Dr. __________.   REASON FOR PRESENTATION:  Evaluate patient with tachycardia.   HISTORY OF PRESENT ILLNESS:  This patient is a 77 year old white female with  cardiac history that includes stress test apparently three years ago,  negative per report.  There has also been an echocardiogram in the past (I  am not sure of results of this).  She recently saw Dr. __________as a new  patient.  He has began to adjust her medications.  He asked her to  discontinue her guanabenz and a Sonata.  She did discontinue the Sonata and  weaned herself off of the guanabenz.  She stopped taking these medicines  approximately 12 days ago completely.  She did notice an elevated heart rate  around this time.  She was given an increased dose of Norvasc.  She said her  blood pressure has been well controlled.  This morning she was shaky  inside.  She also felt a little swimmy headed.  She went back for  followup with Dr. __________ and was noted to have tachycardia on EKG.  This  was a rate of 126.  It was regular.  The patient did not feel presyncopal  and has had no syncope.  She has not had any chest discomfort, neck  discomfort, arm discomfort, activity induced nausea, vomiting, excessive  diaphoresis.  She is relatively sedentary.  She does some light  housekeeping.  With this, she denies any of the above symptoms.  She has  otherwise felt relatively well.  She has had some nonspecific fatigue.  She  has had some lower extremity swelling which seems to be baseline.   PAST MEDICAL HISTORY:   Fibromyalgia, chronic fatigue, hypertension,  hypothyroidism, depression/anxiety, gastroesophageal reflux disease,  hypertension, previous cerebrovascular accident, right renal artery  aneurysm, chronic neck pain.   PAST SURGICAL HISTORY:  Hysterectomy, cervical fusion, cerebral hemorrhage.   ALLERGIES:  1. CEPHALOSPORIN.  2. AUGMENTIN.  3. CEPHALEXIN.  4. CEFTIN.  5. SULFA.   MEDICATIONS:  1. Avapro 300 mg daily.  2. Norvasc 10 mg daily.  3. Toprol XL 200 mg daily.  4. Demadex 20 mg b.i.d.  5. Lorazepam.  6. Calcium.  7. Duragesic patch.  8. Baclofen.  9. Potassium.  10.      Oxycodone.  11.      Phenergan.  12.      Mucinex.  13.      Thyroid 60 mcg daily.  SOCIAL HISTORY:  The patient lives in [place] with her husband.  She has  four children.  She is a retired Engineer, site.  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  She does not smoke cigarettes and does not drink  alcohol.   FAMILY HISTORY:  Noncontributory for early coronary artery disease.   REVIEW OF SYSTEMS:  As stated in the HPI, positive for diffuse weakness,  reflux, diffuse pain.  Otherwise, negative for all other systems.   PHYSICAL EXAMINATION:  GENERAL:  The patient is in no distress.  She appears  somewhat anxious.  VITAL SIGNS:  Blood pressure 170/71, heart rate 118 and regular, afebrile.  HEENT:  Unremarkable.  Pupils equal, round and reactive to light.  Fundi not  visualized.  Oral mucosa unremarkable.  NECK:  No jugular venous distention.  Wave form within normal limits.  Carotid upstroke and symmetric.  No bruits.  No thyromegaly.  LYMPHATICS:  No cervical, axillary, or inguinal adenopathy.  LUNGS:  Clear to auscultation bilaterally.  BACK:  No costovertebral angle tenderness.  CHEST:  Unremarkable.  HEART:  PMI not displaced or sustained.  S1, S2 within normal limits.  No  S3, S4, no murmurs.  ABDOMEN:  Obese, positive bowel sounds.  Normal in frequency and pitch.  No  bruits,  rebound, guarding, or midline pulse.  No mass or hepatosplenomegaly.  SKIN:  No rashes.  No nodules.  EXTREMITIES:  2+ pulses throughout.  Moderate bilateral lower extremity  edema, chronic lower extremity left greater than right swelling with  questionable lymphedema.  NEURO:  Oriented to person, place and time.  Cranial nerves II-XII grossly  intact.  Motor grossly intact.   EKG sinus tachycardia, rate 120, left axis deviation, poor anterior R-wave  progression, no acute ST-T wave changes.   ASSESSMENT/PLAN:  Tachycardia.  The patient does have a tachycardia that  appears to be sinus.  The etiology of this is unclear.  I checked to see if  she has had recent thyroid studies done.  We will check an echocardiogram to  further evaluate LV function.  Will monitor on telemetry.  For now, she is  on IV Diltiazem.  This can be converted to p.o. and her Toprol increased.  Will also review medications, particularly another over-the-counters that  could be contributing.                                                Rollene Rotunda, M.D.    JH/MEDQ  D:  10/20/2003  T:  10/21/2003  Job:  [id number]

## 2010-09-28 ENCOUNTER — Other Ambulatory Visit: Payer: Self-pay | Admitting: *Deleted

## 2010-09-28 MED ORDER — FENTANYL 25 MCG/HR TD PT72: 1.0000 | MEDICATED_PATCH | TRANSDERMAL | Status: DC

## 2010-09-28 MED ORDER — OXYCODONE HCL 5 MG PO TABS: 5.0000 mg | ORAL_TABLET | Freq: Four times a day (QID) | ORAL | Status: DC | PRN

## 2010-09-28 NOTE — Telephone Encounter (Signed)
Pt's daughter states she just realized that pt is out of oxycodone, will also need refill on fentanyl soon.  Please call her when scripts are ready.  She apologizes for the short notice.

## 2010-09-28 NOTE — Telephone Encounter (Signed)
[name] advised that Rx is ready for pick up will be left at front desk.

## 2010-10-08 ENCOUNTER — Telehealth: Payer: Self-pay | Admitting: *Deleted

## 2010-10-08 NOTE — Telephone Encounter (Signed)
Pt's daughter states pt is in a lot of pain, crying.  [name] is asking if ok to use 12.5 mcg fentanyl patches along with the 25's that she has.  She has some of these at home.  She tried this before but pt developed breathing problems. [name] says the pt is in so much pain she doesn't care what she takes or uses.  Please advise on what she can do.

## 2010-10-08 NOTE — Telephone Encounter (Signed)
Okay to add the along with the 25 She may have developed some tolerance to the higher dose so side effects are less likely Please change her med list  Set up appt to eval if this doesn't help pain

## 2010-10-10 MED ORDER — FENTANYL 12 MCG/HR TD PT72: 1.0000 | MEDICATED_PATCH | TRANSDERMAL | Status: DC

## 2010-10-10 NOTE — Telephone Encounter (Signed)
Spoke with patient's daughter and advised results, the does help, she had some old ones from a previous rx, but she will be needing a new rx for the . Please advise.

## 2010-10-22 ENCOUNTER — Other Ambulatory Visit: Payer: Self-pay | Admitting: *Deleted

## 2010-10-22 MED ORDER — LIOTRIX (T3-T4) 15 (3.1-12.5) MG (MCG) PO TABS: 1.0000 | ORAL_TABLET | Freq: Every day | ORAL | Status: DC

## 2010-10-22 NOTE — Telephone Encounter (Signed)
Please take care of this.  

## 2010-10-22 NOTE — Telephone Encounter (Signed)
Will write Rx tomorrow

## 2010-10-22 NOTE — Telephone Encounter (Signed)
Patient coming for OV 10/23/2010 will pick up written rx's then. Spoke with Rob at [facility] and [facility] is closed for the [employer]) and he suggested that we call [facility] to see if they can make the medication. We will discuss with patient when she comes in.

## 2010-10-23 ENCOUNTER — Encounter: Payer: Self-pay | Admitting: Internal Medicine

## 2010-10-23 ENCOUNTER — Ambulatory Visit (INDEPENDENT_AMBULATORY_CARE_PROVIDER_SITE_OTHER): Payer: Medicare Other | Admitting: Internal Medicine

## 2010-10-23 VITALS — BP 148/80 | HR 76 | Temp 98.6°F | Ht 62.0 in | Wt 281.0 lb

## 2010-10-23 DIAGNOSIS — R300 Dysuria: Secondary | ICD-10-CM | POA: Insufficient documentation

## 2010-10-23 LAB — POCT URINALYSIS DIPSTICK
Leukocytes, UA: NEGATIVE
Protein, UA: NEGATIVE
Urobilinogen, UA: 0.2
pH, UA: 6

## 2010-10-23 MED ORDER — CIPROFLOXACIN HCL 250 MG PO TABS: 250.0000 mg | ORAL_TABLET | Freq: Two times a day (BID) | ORAL | Status: AC

## 2010-10-23 MED ORDER — LIOTRIX (T3-T4) 15 (3.1-12.5) MG (MCG) PO TABS: 1.0000 | ORAL_TABLET | Freq: Every day | ORAL | Status: DC

## 2010-10-23 MED ORDER — PHENAZOPYRIDINE HCL 100 MG PO TABS: 100.0000 mg | ORAL_TABLET | Freq: Three times a day (TID) | ORAL | Status: AC | PRN

## 2010-10-23 MED ORDER — OXYCODONE HCL 5 MG PO TABS: 5.0000 mg | ORAL_TABLET | Freq: Four times a day (QID) | ORAL | Status: DC | PRN

## 2010-10-23 NOTE — Assessment & Plan Note (Signed)
Recurrent Not clear if it is infection or irritation (prolapse will actually touch pads) U/A negative now Will give pyridium to try, cipro if pain continues

## 2010-10-23 NOTE — Progress Notes (Signed)
Subjective:    Patient ID: Kayla [name], female    DOB: November 10, 1923, 77 y.o.   MRN: [id number]  HPI Having dysuria again Has had recurrences just about every 2-3 weeks Daughter will give 3 days of cipro as directed and that helps  She feels it is from the proloapse irritating bladder Has more frequency No hematuria No fever  Had gotten pyridium from Dr Shawnie Pons in past This helped as well  Current Outpatient Prescriptions on File Prior to Visit  Medication Sig Dispense Refill  . aliskiren (TEKTURNA) 300 MG tablet Take 300 mg by mouth daily.        Marland Kitchen amLODipine (NORVASC) 10 MG tablet Take 10 mg by mouth daily.        Marland Kitchen aspirin 81 MG tablet Take 81 mg by mouth daily.        . carvedilol (COREG) 25 MG tablet Take 1 tablet (25 mg total) by mouth 2 (two) times daily with a meal.  180 tablet  3  . Casanthranol-Docusate Sodium (LAXATIVE PLUS STOOL SOFTENER) 30-100 MG CAPS Take 1 capsule by mouth 4 (four) times daily as needed.        . cromolyn (NASALCROM) 5.2 MG/ACT nasal spray 2 sprays by Nasal route daily.  180 Act  3  . fentaNYL (DURAGESIC - DOSED MCG/HR) 12 MCG/HR Place 1 patch (12.5 mcg total) onto the skin every 3 (three) days. along with the patch  10 patch  0  . fentaNYL (DURAGESIC - DOSED MCG/HR) 25 MCG/HR Place 1 patch (25 mcg total) onto the skin every 3 (three) days.  10 patch  0  . lisinopril (PRINIVIL,ZESTRIL) 40 MG tablet Take 40 mg by mouth daily.        . Lutein-Zeaxanthin 6-1 MG TABS Take 1 tablet by mouth daily.        . Melatonin 3 MG CAPS Take 1 capsule by mouth daily.        Marland Kitchen nystatin (MYCOSTATIN) cream Apply 1 application topically 3 (three) times daily as needed.        . potassium chloride SA (K-DUR,KLOR-CON) 20 MEQ tablet Take 20 mEq by mouth daily.        . pregabalin (LYRICA) 75 MG capsule Take 1 capsule (75 mg total) by mouth at bedtime.  30 capsule  2  . Probiotic Product (ACIDOPHILUS PROBIOTIC BLEND) CAPS Take 1 capsule by mouth daily.        Lyman Bishop  Protein Strong POWD by Does not apply route as needed.        . torsemide (DEMADEX) 20 MG tablet Take 20 mg by mouth daily.        Gerarda Fraction Root 450 MG CAPS Take 1 capsule by mouth daily.        . vitamin E 400 UNIT capsule Take 400 Units by mouth daily.        Marland Kitchen DISCONTD: Liotrix (THYROLAR-1/4) 15 (3.1-12.5) MG (MCG) TABS Take 1 tablet by mouth daily.  30 each  11  . DISCONTD: oxyCODONE (OXY IR/ROXICODONE) 5 MG immediate release tablet Take 1 tablet (5 mg total) by mouth every 6 (six) hours as needed.  120 tablet  0   Past Medical History  Diagnosis Date  . HTN (hypertension)     difficult to control  . Hypothyroidism   . OP (osteoporosis)   . Fibromyalgia   . Edema of both legs     chronic  . Degenerative disk disease     cervical  .  Follicular lymphoma     Quiescent. Dr. Sunday Corn; Dr. Elenore Rota  . Meningioma     Left frontal  . GERD (gastroesophageal reflux disease)   . Obesity   . Cerebral hemorrhage 2004    hypertensive  . Hx: UTI (urinary tract infection)   . Renal artery aneurysm     with stenosis-right; followed by Dr. Gilda Crease  . Diastolic CHF, chronic     Echo (9-11) with EF 65-70%, mild LVH, mid AI, Mild MR, PA systolic pressure >    Past Surgical History  Procedure Date  . Cataract extraction 10/05 and 4/07    Left then Right [facility])  . Abdominal hysterectomy 1958  . Cerebral hemorrhage 10/2002  . Cervical fusion 1985  . Rectocele repair 1982  . Cystocele repair 1982    Family History  Problem Relation Age of Onset  . Stroke Father   . Cancer Mother     Myeloma  . Hypertension      family    History   Social History  . Marital Status: Married    Spouse Name: N/A    Number of Children: N/A  . Years of Education: N/A   Occupational History  . Retired- Scientist, clinical (histocompatibility and immunogenetics) and taught    Social History Main Topics  . Smoking status: Former Smoker    Types: Cigars  . Smokeless tobacco: Never Used   Comment: Quit smoking 50 years ago  . Alcohol  Use: No  . Drug Use: Not on file  . Sexually Active: Not on file   Other Topics Concern  . Not on file   Social History Narrative  . No narrative on file   Review of Systems Persistent nausea but not related to bladder---occ dramamine helps Appetite fair    Objective:   Physical Exam  Constitutional: She appears well-developed and well-nourished. No distress.  Abdominal: Soft. There is no tenderness.  Musculoskeletal:       No CVA tenderness          Assessment & Plan:

## 2010-10-23 NOTE — Patient Instructions (Signed)
Keep regular follow-up appointment

## 2010-11-08 ENCOUNTER — Other Ambulatory Visit: Payer: Self-pay | Admitting: *Deleted

## 2010-11-08 MED ORDER — FENTANYL 25 MCG/HR TD PT72: 1.0000 | MEDICATED_PATCH | TRANSDERMAL | Status: DC

## 2010-11-08 MED ORDER — FENTANYL 12 MCG/HR TD PT72: 1.0000 | MEDICATED_PATCH | TRANSDERMAL | Status: DC

## 2010-11-08 NOTE — Telephone Encounter (Signed)
Left message on machine that rx is ready for pick-up, and it will be at our front desk.  

## 2010-11-19 ENCOUNTER — Other Ambulatory Visit: Payer: Self-pay | Admitting: *Deleted

## 2010-11-19 MED ORDER — OXYCODONE HCL 5 MG PO TABS: 5.0000 mg | ORAL_TABLET | Freq: Four times a day (QID) | ORAL | Status: DC | PRN

## 2010-11-19 NOTE — Telephone Encounter (Signed)
rx printed

## 2010-11-19 NOTE — Telephone Encounter (Signed)
Please call [name] when script is ready.

## 2010-11-19 NOTE — Telephone Encounter (Signed)
Spoke with patient and advised results   

## 2010-12-10 ENCOUNTER — Telehealth: Payer: Self-pay | Admitting: *Deleted

## 2010-12-10 MED ORDER — FENTANYL 12 MCG/HR TD PT72: 1.0000 | MEDICATED_PATCH | TRANSDERMAL | Status: DC

## 2010-12-10 MED ORDER — FENTANYL 25 MCG/HR TD PT72: 1.0000 | MEDICATED_PATCH | TRANSDERMAL | Status: DC

## 2010-12-10 NOTE — Telephone Encounter (Signed)
Left message on machine advising Kayla [name] that scripts are ready for pick up.

## 2010-12-10 NOTE — Telephone Encounter (Signed)
Requests Rx to be picked up.  Please call when ready.

## 2010-12-24 ENCOUNTER — Other Ambulatory Visit: Payer: Self-pay | Admitting: *Deleted

## 2010-12-24 MED ORDER — OXYCODONE HCL 5 MG PO TABS: 5.0000 mg | ORAL_TABLET | Freq: Four times a day (QID) | ORAL | Status: DC | PRN

## 2010-12-24 NOTE — Telephone Encounter (Signed)
Patient's daughter notified that rx is up front and ready for pickup.

## 2010-12-24 NOTE — Telephone Encounter (Signed)
Needs oxycodone rx

## 2011-01-04 ENCOUNTER — Telehealth: Payer: Self-pay | Admitting: *Deleted

## 2011-01-04 NOTE — Telephone Encounter (Signed)
Noted.  Agree.  Increased SOB, decreased activity in 77yo with h/o dCHF.

## 2011-01-04 NOTE — Telephone Encounter (Signed)
Daughter called stating that her mom had dark circles around both eyes that just seemed to come up within the last hour or so.  She stated that her mom has been complaining of shortness of breath and not being able to get enough air.  No wheezing, no headache, no dizziness, and none of her medications has been changed lately.  I advised daughter to take her mom to Urgent care to be evaluated.  She stated that her mom is usually pretty active but hasn't been today.  By it being Friday and our office being closed on Monday patient should be seen just to rule out anything that could be dangerous later.

## 2011-01-06 NOTE — Telephone Encounter (Signed)
Please check on her Tuesday AM

## 2011-01-09 NOTE — Telephone Encounter (Signed)
okay

## 2011-01-09 NOTE — Telephone Encounter (Signed)
Spoke with patient and she states she's doing OK, her legs are swelling, she was constipated that's why she had SOB per pt, pt has an appt on Friday 9/7 she will discuss more things with you then.

## 2011-01-11 ENCOUNTER — Ambulatory Visit (INDEPENDENT_AMBULATORY_CARE_PROVIDER_SITE_OTHER): Payer: Medicare Other | Admitting: Internal Medicine

## 2011-01-11 ENCOUNTER — Encounter: Payer: Self-pay | Admitting: Internal Medicine

## 2011-01-11 VITALS — BP 148/84 | HR 84 | Temp 98.9°F | Ht 62.75 in | Wt 264.5 lb

## 2011-01-11 DIAGNOSIS — I509 Heart failure, unspecified: Secondary | ICD-10-CM

## 2011-01-11 DIAGNOSIS — I10 Essential (primary) hypertension: Secondary | ICD-10-CM

## 2011-01-11 DIAGNOSIS — I5032 Chronic diastolic (congestive) heart failure: Secondary | ICD-10-CM

## 2011-01-11 DIAGNOSIS — M5030 Other cervical disc degeneration, unspecified cervical region: Secondary | ICD-10-CM

## 2011-01-11 DIAGNOSIS — E039 Hypothyroidism, unspecified: Secondary | ICD-10-CM

## 2011-01-11 MED ORDER — FENTANYL 25 MCG/HR TD PT72: 1.0000 | MEDICATED_PATCH | TRANSDERMAL | Status: DC

## 2011-01-11 MED ORDER — FENTANYL 12 MCG/HR TD PT72: 1.0000 | MEDICATED_PATCH | TRANSDERMAL | Status: DC

## 2011-01-11 NOTE — Assessment & Plan Note (Signed)
Seems compensated Weight down considerably May benefit from change in antihypertensive from amlodipine due to edema Will leave to cardiology

## 2011-01-11 NOTE — Assessment & Plan Note (Signed)
Seems to be euthyroid Will check labs 

## 2011-01-11 NOTE — Assessment & Plan Note (Signed)
Doing okay with pain regimen

## 2011-01-11 NOTE — Assessment & Plan Note (Signed)
BP Readings from Last 3 Encounters:  01/11/11 148/84  10/23/10 148/80  09/11/10 140/80   Reasonable control Will check renal function

## 2011-01-11 NOTE — Progress Notes (Signed)
Subjective:    Patient ID: Kayla [name], female    DOB: [birth year], 77 y.o.   MRN: [id number]  HPI Here with daughter as usual Notes that she is voiding a lot Hasn't needed the torsemide---hasn't seemed to need it Still trouble with incontinence--has to use pads  No heart problems No chest pain Occ gets epigastric pressure--relates to hiatal hernia Breathing has been about the same  Still has significant edema Some pain in feet when she walks--and trouble finding shoes that fit right Weight is down 16# though Wonders about the amlodipine --could be related to the edema  Satisfied with pain control Fentanyl works great --really can tell the difference as the end of the 3rd day comes occ acetaminophen as well  Had problem with Bartholin cyst in past Now with some swelling on the other side May be as big as 2cm by her description Irritated by pads occ has blood  Current Outpatient Prescriptions on File Prior to Visit  Medication Sig Dispense Refill  . aliskiren (TEKTURNA) 300 MG tablet Take 300 mg by mouth daily.        Marland Kitchen amLODipine (NORVASC) 10 MG tablet Take 10 mg by mouth daily.        Marland Kitchen aspirin 81 MG tablet Take 81 mg by mouth daily.        . carvedilol (COREG) 25 MG tablet Take 1 tablet (25 mg total) by mouth 2 (two) times daily with a meal.  180 tablet  3  . Casanthranol-Docusate Sodium (LAXATIVE PLUS STOOL SOFTENER) 30-100 MG CAPS Take 1 capsule by mouth 4 (four) times daily as needed.        . cromolyn (NASALCROM) 5.2 MG/ACT nasal spray 2 sprays by Nasal route daily.  180 Act  3  . fentaNYL (DURAGESIC - DOSED MCG/HR) 12 MCG/HR Place 1 patch (12.5 mcg total) onto the skin every 3 (three) days. along with the patch  10 patch  0  . fentaNYL (DURAGESIC - DOSED MCG/HR) 25 MCG/HR Place 1 patch (25 mcg total) onto the skin every 3 (three) days.  10 patch  0  . Liotrix (THYROLAR-1/4) 15 (3.1-12.5) MG (MCG) TABS Take 1 tablet by mouth daily.  30 each  11  . lisinopril  (PRINIVIL,ZESTRIL) 40 MG tablet Take 40 mg by mouth daily.        . Lutein-Zeaxanthin 6-1 MG TABS Take 1 tablet by mouth daily.        . Melatonin 3 MG CAPS Take 1 capsule by mouth daily.        Marland Kitchen oxyCODONE (OXY IR/ROXICODONE) 5 MG immediate release tablet Take 1 tablet (5 mg total) by mouth every 6 (six) hours as needed.  120 tablet  0  . potassium chloride SA (K-DUR,KLOR-CON) 20 MEQ tablet Take 20 mEq by mouth daily.        . Probiotic Product (ACIDOPHILUS PROBIOTIC BLEND) CAPS Take 1 capsule by mouth daily.        Lyman Bishop Protein Strong POWD by Does not apply route as needed.        Gerarda Fraction Root 450 MG CAPS Take 1 capsule by mouth daily.        Marland Kitchen torsemide (DEMADEX) 20 MG tablet Take 20 mg by mouth daily as needed.         Allergies  Allergen Reactions  . Cefuroxime Axetil   . Cephalexin     Felt uncomfortable  . WUJ:[id number] Acid+Aspartame   . Sulfonamide Derivatives  unknown    Past Medical History  Diagnosis Date  . HTN (hypertension)     difficult to control  . Hypothyroidism   . OP (osteoporosis)   . Fibromyalgia   . Edema of both legs     chronic  . Degenerative disk disease     cervical  . Follicular lymphoma     Quiescent. Dr. Sunday Corn; Dr. Elenore Rota  . Meningioma     Left frontal  . GERD (gastroesophageal reflux disease)   . Obesity   . Cerebral hemorrhage 2004    hypertensive  . Hx: UTI (urinary tract infection)   . Renal artery aneurysm     with stenosis-right; followed by Dr. Gilda Crease  . Diastolic CHF, chronic     Echo (9-11) with EF 65-70%, mild LVH, mid AI, Mild MR, PA systolic pressure >    Past Surgical History  Procedure Date  . Cataract extraction 10/05 and 4/07    Left then Right [facility])  . Abdominal hysterectomy 1958  . Cerebral hemorrhage 10/2002  . Cervical fusion 1985  . Rectocele repair 1982  . Cystocele repair 1982    Family History  Problem Relation Age of Onset  . Stroke Father   . Cancer  Mother     Myeloma  . Hypertension      family    History   Social History  . Marital Status: Married    Spouse Name: N/A    Number of Children: N/A  . Years of Education: N/A   Occupational History  . Retired- Scientist, clinical (histocompatibility and immunogenetics) and taught    Social History Main Topics  . Smoking status: Never Smoker   . Smokeless tobacco: Never Used   Comment: Quit smoking 50 years ago  . Alcohol Use: No  . Drug Use: Not on file  . Sexually Active: Not on file   Other Topics Concern  . Not on file   Social History Narrative  . No narrative on file   Review of Systems Appetite has been okay but she is trying to eat healthy May have some gluten sensitivity--does okay avoiding pasta and many meds Sleeping okay---in chair to keep pressure off neck    Objective:   Physical Exam  Constitutional: She appears well-developed and well-nourished. No distress.  Neck: Normal range of motion. Neck supple. No thyromegaly present.  Cardiovascular: Normal rate and regular rhythm.   Murmur heard.      Soft systolic murmur  Pulmonary/Chest: Effort normal and breath sounds normal. No respiratory distress. She has no wheezes. She has no rales.  Genitourinary:       2 mildly inflamed cysts on upper right thigh No vulvar lesions or cysts  Musculoskeletal: Normal range of motion. She exhibits no edema.  Lymphadenopathy:    She has no cervical adenopathy.  Psychiatric: She has a normal mood and affect. Her behavior is normal. Judgment and thought content normal.          Assessment & Plan:

## 2011-01-12 LAB — CBC WITH DIFFERENTIAL/PLATELET
HCT: 43 % (ref 36.0–46.0)
Hemoglobin: 14 g/dL (ref 12.0–15.0)
Lymphocytes Relative: 21 % (ref 12–46)
Lymphs Abs: 1.4 10*3/uL (ref 0.7–4.0)
Monocytes Absolute: 0.5 10*3/uL (ref 0.1–1.0)
Monocytes Relative: 8 % (ref 3–12)
Neutro Abs: 4.6 10*3/uL (ref 1.7–7.7)
Neutrophils Relative %: 70 % (ref 43–77)
RBC: 4.84 MIL/uL (ref 3.87–5.11)

## 2011-01-12 LAB — BASIC METABOLIC PANEL
BUN: 16 mg/dL (ref 6–23)
CO2: 26 mEq/L (ref 19–32)
Chloride: 102 mEq/L (ref 96–112)
Glucose, Bld: 106 mg/dL — ABNORMAL HIGH (ref 70–99)
Potassium: 4.3 mEq/L (ref 3.5–5.3)
Sodium: 140 mEq/L (ref 135–145)

## 2011-01-12 LAB — TSH: TSH: 2.2 u[IU]/mL (ref 0.350–4.500)

## 2011-01-12 LAB — T4, FREE: Free T4: 0.89 ng/dL (ref 0.80–1.80)

## 2011-01-14 ENCOUNTER — Telehealth: Payer: Self-pay | Admitting: *Deleted

## 2011-01-14 NOTE — Telephone Encounter (Signed)
She can try warm compresses to allow them to drain No antibiotics are needed at this point

## 2011-01-14 NOTE — Telephone Encounter (Signed)
Spoke with daughter and advised results.  

## 2011-01-14 NOTE — Telephone Encounter (Signed)
Pt states you had told her on Friday that she has some cysts on her legs and she is asking what she should do about these, if anything.  Please advise.

## 2011-01-17 ENCOUNTER — Encounter: Payer: Self-pay | Admitting: *Deleted

## 2011-01-23 ENCOUNTER — Other Ambulatory Visit: Payer: Self-pay | Admitting: *Deleted

## 2011-01-23 MED ORDER — OXYCODONE HCL 5 MG PO TABS: 5.0000 mg | ORAL_TABLET | Freq: Four times a day (QID) | ORAL | Status: DC | PRN

## 2011-01-23 NOTE — Telephone Encounter (Signed)
Spoke with daughter and advised results.  

## 2011-01-23 NOTE — Telephone Encounter (Signed)
Please call pt when ready.

## 2011-02-14 ENCOUNTER — Encounter: Payer: Self-pay | Admitting: Internal Medicine

## 2011-02-14 ENCOUNTER — Ambulatory Visit: Payer: Medicare Other | Admitting: Cardiology

## 2011-02-18 ENCOUNTER — Other Ambulatory Visit: Payer: Self-pay | Admitting: *Deleted

## 2011-02-18 MED ORDER — OXYCODONE HCL 5 MG PO TABS: 5.0000 mg | ORAL_TABLET | Freq: Four times a day (QID) | ORAL | Status: DC | PRN

## 2011-02-18 MED ORDER — FENTANYL 12 MCG/HR TD PT72: 1.0000 | MEDICATED_PATCH | TRANSDERMAL | Status: DC

## 2011-02-18 MED ORDER — FENTANYL 25 MCG/HR TD PT72: 1.0000 | MEDICATED_PATCH | TRANSDERMAL | Status: DC

## 2011-02-18 NOTE — Telephone Encounter (Signed)
Left message on cell phone voicemail for Kayla [name], Rx's ready for pick up will be left at front desk.  Will send message back to Dr. Alphonsus Sias regarding pump for patients leg.

## 2011-02-18 NOTE — Telephone Encounter (Signed)
Please call [name] when scripts are ready. She is also asking for an order for pt to get a pump to pump the fluid out of her legs.  She had spoken with her insurance agent about this and he told her he thought it would be covered.

## 2011-02-18 NOTE — Telephone Encounter (Signed)
I have not prescribed the pump and think it would be best for her to be evaluated by a vascular specialist for this It is mostly for lymphedema which is not what I think she has We can refer to vascular specialist if she would like

## 2011-02-19 NOTE — Telephone Encounter (Signed)
Spoke with daughter [name] who states " i'm at my wits end" daughters states she doesn't know what else to do about the fluid on her mother's legs. Pt uses the bathroom a lot but will not get up and move around, per daughter pt doesn't elevate her legs like she should. Pt has been to [facility] in the past years, 2010? Pt can't wear ted hose, or won't per daughter and any other device has not worked they have tried everything except the pumps. daughter sounded very agitated she wants to help her mother but doesn't know what to do. Please advise.

## 2011-02-19 NOTE — Telephone Encounter (Signed)
Please find out who they use for equipment and call for information about what is needed for leg pump for edema If I can document what is needed, I will order

## 2011-02-19 NOTE — Telephone Encounter (Signed)
Patient uses [employer], I called them and left message for Missy to return my call.

## 2011-02-20 NOTE — Telephone Encounter (Signed)
Spoke with [facility] supply and they do not have anyone certified to do the pumps, they don't have them or could help with the pump without being certified, they are checking on names and numbers of places that may have the pump. I will call the daughter and advise results. Spoke with daughter and she states that the pump can be bought at the "[employer]" store? I asked if it was the same she stated it cost about $150-300 versus $5000 from a medical supply store. Daughter wanted me to call [facility] in [place] to ask them where to get them from? ask for Storm Frisk [phone], per daughter they are the people that told her mother about the pumps. Tried calling [facility] and left message.

## 2011-02-20 NOTE — Telephone Encounter (Signed)
[facility] suggested [employer].

## 2011-02-21 NOTE — Telephone Encounter (Signed)
[name] called back and said she would like to have this put on hold until she can get things settled with her father.  She will call back when she wants to persue it again.

## 2011-02-21 NOTE — Telephone Encounter (Signed)
Okay I am not sure I am the one to help with the pump anyway

## 2011-02-21 NOTE — Telephone Encounter (Signed)
The pumps I know of would probably more like the $5000 and I'm not sure of the coverage I don't think you can really get a pump for $300 but I will order it if I know what it is and she is willing to pay if insurance doesn't

## 2011-02-28 ENCOUNTER — Telehealth: Payer: Self-pay | Admitting: *Deleted

## 2011-02-28 MED ORDER — PHENAZOPYRIDINE HCL 100 MG PO TABS: 100.0000 mg | ORAL_TABLET | Freq: Three times a day (TID) | ORAL | Status: DC | PRN

## 2011-02-28 NOTE — Telephone Encounter (Signed)
Okay to refill the phenazopyridine for 3 days

## 2011-02-28 NOTE — Telephone Encounter (Signed)
Spoke with daughter and advised results. rx sent to pharmacy by e-script  

## 2011-02-28 NOTE — Telephone Encounter (Signed)
Pt's daughter wants to try and get the edema pump.  She has found a place that sells them, has found a vender with [employer], Lora Havens, who, [name] says, can answer any questions that you might have.  His number is [phone], cell phone.  [name] got this number from someone at the wound care center, they refer a lot of patients to this company.  Also, she is asking for a refill on pt's phenazopyridine, I dont see this in the chart.  Uses [facility] in [place].  [name] says pt last got this in July.

## 2011-02-28 NOTE — Telephone Encounter (Signed)
Okay--I will do my best with that when I see what they need

## 2011-02-28 NOTE — Telephone Encounter (Signed)
See note below- [name] spoke with the vendor, he has a short form that will need to be completed for the pump.

## 2011-03-11 ENCOUNTER — Telehealth: Payer: Self-pay | Admitting: *Deleted

## 2011-03-11 NOTE — Telephone Encounter (Signed)
Daughter calling asking about status of edema pump. We have not received the form that she stated was supposed to be sent. I called again today to [employer], Lora Havens [phone], cell phone and left a message to have him return my call to ask about the form.

## 2011-03-12 NOTE — Telephone Encounter (Signed)
Spoke with daughter and advised we haven't received the forms yet. She will also try and call.

## 2011-03-20 ENCOUNTER — Other Ambulatory Visit: Payer: Self-pay | Admitting: *Deleted

## 2011-03-20 NOTE — Telephone Encounter (Signed)
Please call daughter when ready.

## 2011-03-21 MED ORDER — OXYCODONE HCL 5 MG PO TABS: 5.0000 mg | ORAL_TABLET | Freq: Four times a day (QID) | ORAL | Status: DC | PRN

## 2011-03-21 NOTE — Telephone Encounter (Signed)
Spoke with patient and advised results   

## 2011-03-26 ENCOUNTER — Other Ambulatory Visit: Payer: Self-pay | Admitting: *Deleted

## 2011-03-26 MED ORDER — FENTANYL 25 MCG/HR TD PT72: 1.0000 | MEDICATED_PATCH | TRANSDERMAL | Status: DC

## 2011-03-26 MED ORDER — FENTANYL 12 MCG/HR TD PT72: 1.0000 | MEDICATED_PATCH | TRANSDERMAL | Status: DC

## 2011-03-26 NOTE — Telephone Encounter (Signed)
Spoke with daughter and advised results.  

## 2011-03-26 NOTE — Telephone Encounter (Signed)
Please let pt know when rx is ready

## 2011-04-01 ENCOUNTER — Telehealth: Payer: Self-pay | Admitting: *Deleted

## 2011-04-01 NOTE — Telephone Encounter (Signed)
I would generally not treat a respiratory infection with cipro but if she is improving, no reason to change  If she is worsening, will need to set up appt

## 2011-04-01 NOTE — Telephone Encounter (Signed)
Pt's daughter states pt has had congestion, cough with yellow mucous.  No fever.  She has been taking cipro, she had some that was prescribed for a UTI, for 2 days and daughter says she is responding to it.  Her husband went to an urgent care and was prescribed z-pack.

## 2011-04-02 ENCOUNTER — Telehealth: Payer: Self-pay | Admitting: *Deleted

## 2011-04-02 NOTE — Telephone Encounter (Signed)
Form for edema pump is on your desk to be filled out.

## 2011-04-02 NOTE — Telephone Encounter (Signed)
Form faxed, left message for daughter that form has been faxed.

## 2011-04-02 NOTE — Telephone Encounter (Signed)
Form done Please fax in

## 2011-04-02 NOTE — Telephone Encounter (Signed)
Spoke with daughter and advised results.  

## 2011-04-08 ENCOUNTER — Telehealth: Payer: Self-pay

## 2011-04-08 MED ORDER — ALISKIREN FUMARATE 300 MG PO TABS: 300.0000 mg | ORAL_TABLET | Freq: Every day | ORAL | Status: DC

## 2011-04-08 NOTE — Telephone Encounter (Signed)
Refill sent for [facility]

## 2011-04-09 ENCOUNTER — Encounter: Payer: Self-pay | Admitting: Cardiology

## 2011-04-10 ENCOUNTER — Encounter: Payer: Self-pay | Admitting: Cardiology

## 2011-04-10 ENCOUNTER — Ambulatory Visit (INDEPENDENT_AMBULATORY_CARE_PROVIDER_SITE_OTHER): Payer: Medicare Other | Admitting: Cardiology

## 2011-04-10 DIAGNOSIS — I5032 Chronic diastolic (congestive) heart failure: Secondary | ICD-10-CM

## 2011-04-10 DIAGNOSIS — I10 Essential (primary) hypertension: Secondary | ICD-10-CM

## 2011-04-10 DIAGNOSIS — J189 Pneumonia, unspecified organism: Secondary | ICD-10-CM

## 2011-04-10 DIAGNOSIS — R0602 Shortness of breath: Secondary | ICD-10-CM

## 2011-04-10 DIAGNOSIS — R609 Edema, unspecified: Secondary | ICD-10-CM

## 2011-04-10 DIAGNOSIS — I509 Heart failure, unspecified: Secondary | ICD-10-CM

## 2011-04-10 MED ORDER — MOXIFLOXACIN HCL 400 MG PO TABS: ORAL_TABLET | ORAL | Status: DC

## 2011-04-10 MED ORDER — HYDRALAZINE HCL 50 MG PO TABS: 50.0000 mg | ORAL_TABLET | Freq: Three times a day (TID) | ORAL | Status: DC

## 2011-04-10 MED ORDER — TORSEMIDE 10 MG PO TABS: 10.0000 mg | ORAL_TABLET | Freq: Every day | ORAL | Status: DC

## 2011-04-10 NOTE — Patient Instructions (Signed)
Get Chest X-Ray today. We will call with results.  STOP Amlodipine. START Moxifloxacin 400mg  once daily for 2 weeks.  START Hydralazine 50mg  THREE times a day. START Torsemide 10mg  once daily.   Your physician recommends that you return for lab work in: 2 weeks (BMP/BNP)  Your physician recommends that you schedule a follow-up appointment in: 4 months (same day as husband-- 07/12/11)

## 2011-04-12 DIAGNOSIS — J189 Pneumonia, unspecified organism: Secondary | ICD-10-CM | POA: Insufficient documentation

## 2011-04-12 NOTE — Assessment & Plan Note (Signed)
As above, stopping amlodipine and will start hydralazine 50 mg tid to replace it.  BMET in 2 weeks.

## 2011-04-12 NOTE — Assessment & Plan Note (Signed)
Patient has had a cough and green/yellow sputum for 2 weeks.  This could certainly be a viral bronchitis, but she has crackles at the right base on exam so I am going to treat her with a course of moxifloxacin and get a CXR.

## 2011-04-12 NOTE — Progress Notes (Signed)
PCP: Dr. Alphonsus Sias  77 yo with history of HTN, obesity, and chronic lower extremity edema presents for cardiology followup.  She has had leg swelling bilaterally for about 10 years.  She has been seen by vascular surgery in [place] and is thought to have a component of venous insufficiency and, I suspect, lymphedema. She cannot wear compression stockings (unable to put them on).  She has been short of breath after walking about 30 feet chronically.  She sleeps with 3 pillows at night, mainly for GERD rather than orthopnea.  No chest pain/tightness.  SBP at home has been running in the 120s-140s per patient.  Weight is down 1 lb.  She stopped taking torsemide because of urinary incontinence.  She has had cough, congestion, and yellow-green sputum for 2.5 weeks now.     Labs (8/11): BNP 50 Labs (9/11): LDL 133, HDL 53, K 4.0, creatinine 0.8, LFTs normal, TSH normal Labs (10/11): K 4.1, creatinine 0.78 Labs (2/12): K 3.9, creatinine 0.81, BNP 85, calcium 11 Labs (3/12): PTH level elevated Labs (9/12): K 4.3, creatinine 0.78, TSH normal  ECG: NSR, 1st degree AV block, poor anterior R wave progression  Allergies (verified):  1)  ! Augmentin 2)  ! Cephalexin 3)  ! Ceftin 4)  ! Sulfa  Family History: Dad died @80  CVAs Mom died @91  myeloma Only child No CAD, DM HTN in family No cancer  Social History: Retired--farmed/taught  Lives in [place] Married   3 sons/1 daughter Quit smoking 50 years ago Alcohol use-no  ROS: All systems reviewed and negative except as per HPI.   Past Medical History: 1. Hypertension: Difficult to control.  2. Hypothyroidism 3. Osteoporosis 4. Fibromyalgia 5. Chronic lower extremity edema/lymphedema.  6. Cervical disk disease 7. Follicular lymphoma: Quiescent.  Dr Cherene Altes, Dr Elenore Rota 8. L frontal meningioma 9. GERD 10. Obesity 11. Cerebral hemorrhage: 2004, hypertensive.  12. H/o Urinary Tract Infection 13. Right renal artery aneurysm w/o  stenosis, followed by Dr. Gilda Crease 14. Diastolic CHF: Echo (9/11) with EF 65-70%, mild LVH, mid AI, mild MR, PA systolic pressure > 40 mmHg.  15. Primary hyperparathyroidism  Current Outpatient Prescriptions  Medication Sig Dispense Refill  . aliskiren (TEKTURNA) 300 MG tablet Take 1 tablet (300 mg total) by mouth daily.  10 tablet  0  . aspirin 81 MG tablet Take 81 mg by mouth daily.        . carvedilol (COREG) 25 MG tablet Take 1 tablet (25 mg total) by mouth 2 (two) times daily with a meal.  180 tablet  3  . Casanthranol-Docusate Sodium (LAXATIVE PLUS STOOL SOFTENER) 30-100 MG CAPS Take 1 capsule by mouth 4 (four) times daily as needed.        . ciprofloxacin (CIPRO) 250 MG tablet 1 tab by mouth 3-4 days  As needed      . cromolyn (NASALCROM) 5.2 MG/ACT nasal spray 2 sprays by Nasal route daily.  180 Act  3  . fentaNYL (DURAGESIC - DOSED MCG/HR) 12 MCG/HR Place 1 patch (12.5 mcg total) onto the skin every 3 (three) days. along with the patch  10 patch  0  . fentaNYL (DURAGESIC - DOSED MCG/HR) 25 MCG/HR Place 1 patch (25 mcg total) onto the skin every 3 (three) days.  10 patch  0  . Liotrix (THYROLAR-1/4) 15 (3.1-12.5) MG (MCG) TABS Take 1 tablet by mouth daily.  30 each  11  . lisinopril (PRINIVIL,ZESTRIL) 40 MG tablet Take 40 mg by mouth daily.        Marland Kitchen  Lutein-Zeaxanthin 6-1 MG TABS Take 1 tablet by mouth daily.        . Melatonin 3 MG CAPS Take 1 capsule by mouth daily.        Marland Kitchen oxyCODONE (OXY IR/ROXICODONE) 5 MG immediate release tablet Take 1 tablet (5 mg total) by mouth every 6 (six) hours as needed.  120 tablet  0  . phenazopyridine (PYRIDIUM) 100 MG tablet Take 1 tablet (100 mg total) by mouth 3 (three) times daily as needed.  9 tablet  0  . potassium chloride SA (K-DUR,KLOR-CON) 20 MEQ tablet Take 20 mEq by mouth daily.        . Probiotic Product (ACIDOPHILUS PROBIOTIC BLEND) CAPS Take 1 capsule by mouth daily.        Lyman Bishop Protein Strong POWD by Does not apply route as  needed.        Gerarda Fraction Root 450 MG CAPS Take 1 capsule by mouth daily.        . Vitamin Mixture (CHROMIUM PICOLINATE PO) Take by mouth as directed.        . hydrALAZINE (APRESOLINE) 50 MG tablet Take 1 tablet (50 mg total) by mouth 3 (three) times daily.  90 tablet  6  . moxifloxacin (AVELOX) 400 MG tablet Take 1 tablet by mouth daily for 2 weeks.  14 tablet  0  . torsemide (DEMADEX) 10 MG tablet Take 1 tablet (10 mg total) by mouth daily.  30 tablet  6    BP 148/82  Pulse 75  Ht 5\' 4"  (1.626 m)  Wt 125.079 kg (275 lb 12 oz)  BMI 47.33 kg/m2 General:  Well developed, well nourished, in no acute distress.  Obese. Neck:  Neck supple, JVP 8 cm. No masses, thyromegaly or abnormal cervical nodes. Lungs:  Crackles at right base.  Heart:  Non-displaced PMI, chest non-tender; regular rate and rhythm, S1, S2 without rubs or gallops. 1/6 SEM RUSB.  Carotid upstroke normal, no bruit.  1+ pitting edema to knees bilaterally but there is also a significant non-pitting component to edema that may be consistent with lymphedema.  Abdomen:  Bowel sounds positive; abdomen soft and non-tender without masses, organomegaly, or hernias noted. No hepatosplenomegaly. Extremities:  No clubbing or cyanosis. Neurologic:  Alert and oriented x 3. Psych:  Normal affect.

## 2011-04-12 NOTE — Assessment & Plan Note (Signed)
Patient has chronic marked lower extremity edema most consistent with venous insufficiency + possible component of lymphedema.  She cannot get compression stockings on her legs. I would like her to go back on at least a low dose of her torsemide, will use 10 mg daily.  She was on 20 mg daily prior but could not tolerate it due to urinary incontinence.   I will also try stopping amlodipine and replacing it with hydralazine to see if this lessens edema any.

## 2011-04-12 NOTE — Assessment & Plan Note (Signed)
Suspect mild component of diastolic CHF.  Starting back on a low dose of torsemide.  Will get a BNP with BMET in 2 wks.

## 2011-04-16 ENCOUNTER — Telehealth: Payer: Self-pay

## 2011-04-16 NOTE — Telephone Encounter (Signed)
Avelox that was given by Dr. Shirlee Latch is causing terrible side effects: anxiety and skin crawling. She stopped the Avelox on Friday, Dec. 7, 2012. What other medication can she take to replace the Avelox?

## 2011-04-16 NOTE — Telephone Encounter (Signed)
She can take doxycycline 400 mg bid

## 2011-04-16 NOTE — Telephone Encounter (Signed)
Earlier message was inaccurate. I wrote the moxifloxacin dose (400 mg) for the doxycycline dose.  Disregard last message.  She should get doxycycline 100 mg bid x 10 days.  Please let me know you got this message.

## 2011-04-17 ENCOUNTER — Telehealth: Payer: Self-pay

## 2011-04-17 MED ORDER — DOXYCYCLINE HYCLATE 100 MG PO TABS: 100.0000 mg | ORAL_TABLET | Freq: Two times a day (BID) | ORAL | Status: AC

## 2011-04-17 NOTE — Telephone Encounter (Signed)
New Rx sent for doxycyline 100 mg bid x 10 days you Nicolette Bang in [place].

## 2011-04-17 NOTE — Telephone Encounter (Signed)
New Rx sent for doxycycline 100 mg take one tablet bid.

## 2011-04-18 ENCOUNTER — Telehealth: Payer: Self-pay | Admitting: *Deleted

## 2011-04-18 NOTE — Telephone Encounter (Signed)
okay

## 2011-04-18 NOTE — Telephone Encounter (Signed)
Spoke with daughter and she states her mother is in pain from head to toe, she doesn't know what's going on, daughter did not want her mother to see anyone else, scheduled appt for 04/22/2011 at 2:00. I advised if anything changes before then she should call, that we have dr's on call.

## 2011-04-18 NOTE — Telephone Encounter (Signed)
Try to get her in with someone tomorrow if there has been a significant change Can use the prn pain meds also (though uses them regularly)

## 2011-04-18 NOTE — Telephone Encounter (Signed)
Daughter left message on triage VM at 4:05pm stating that her mother is in extreme pain and it's getting worse, need to know what to do, expect to her back today. Please advise

## 2011-04-22 ENCOUNTER — Ambulatory Visit: Payer: Medicare Other | Admitting: Internal Medicine

## 2011-04-24 ENCOUNTER — Ambulatory Visit (INDEPENDENT_AMBULATORY_CARE_PROVIDER_SITE_OTHER): Payer: Medicare Other | Admitting: *Deleted

## 2011-04-24 DIAGNOSIS — R0602 Shortness of breath: Secondary | ICD-10-CM

## 2011-04-24 DIAGNOSIS — I509 Heart failure, unspecified: Secondary | ICD-10-CM

## 2011-04-24 DIAGNOSIS — I5032 Chronic diastolic (congestive) heart failure: Secondary | ICD-10-CM

## 2011-04-25 LAB — BASIC METABOLIC PANEL
Calcium: 10.3 mg/dL — ABNORMAL HIGH (ref 8.6–10.2)
Chloride: 99 mmol/L (ref 97–108)
GFR calc non Af Amer: 72 mL/min/{1.73_m2} (ref >59)
Glucose: 103 mg/dL — ABNORMAL HIGH (ref 65–99)
Potassium: 4 mmol/L (ref 3.5–5.2)
Sodium: 139 mmol/L (ref 134–144)

## 2011-04-26 ENCOUNTER — Other Ambulatory Visit: Payer: Self-pay | Admitting: Internal Medicine

## 2011-04-26 MED ORDER — FENTANYL 12 MCG/HR TD PT72: 1.0000 | MEDICATED_PATCH | TRANSDERMAL | Status: DC

## 2011-04-26 MED ORDER — FENTANYL 25 MCG/HR TD PT72: 1.0000 | MEDICATED_PATCH | TRANSDERMAL | Status: DC

## 2011-04-26 NOTE — Telephone Encounter (Signed)
Requesting refill for Fentanyl patches.  Call back is [phone] or [phone]

## 2011-04-26 NOTE — Telephone Encounter (Signed)
Rx ready for pick up patient advised via telephone.

## 2011-05-17 ENCOUNTER — Ambulatory Visit (INDEPENDENT_AMBULATORY_CARE_PROVIDER_SITE_OTHER): Payer: Medicare Other | Admitting: Internal Medicine

## 2011-05-17 ENCOUNTER — Encounter: Payer: Self-pay | Admitting: Internal Medicine

## 2011-05-17 VITALS — BP 138/78 | HR 66 | Temp 97.1°F | Ht 64.0 in | Wt 247.0 lb

## 2011-05-17 DIAGNOSIS — I10 Essential (primary) hypertension: Secondary | ICD-10-CM

## 2011-05-17 DIAGNOSIS — I509 Heart failure, unspecified: Secondary | ICD-10-CM

## 2011-05-17 DIAGNOSIS — R32 Unspecified urinary incontinence: Secondary | ICD-10-CM

## 2011-05-17 DIAGNOSIS — I5032 Chronic diastolic (congestive) heart failure: Secondary | ICD-10-CM

## 2011-05-17 DIAGNOSIS — M5030 Other cervical disc degeneration, unspecified cervical region: Secondary | ICD-10-CM

## 2011-05-17 MED ORDER — PHENAZOPYRIDINE HCL 100 MG PO TABS: 100.0000 mg | ORAL_TABLET | Freq: Three times a day (TID) | ORAL | Status: DC | PRN

## 2011-05-17 NOTE — Assessment & Plan Note (Signed)
Still with limited exercise tolerance No recent changes

## 2011-05-17 NOTE — Progress Notes (Signed)
Subjective:    Patient ID: Kayla [name], female    DOB: 1923-08-16, 78 y.o.   MRN: [id number]  HPI Here with daughter Doing okay Has taken herself off the oxycodone Still on fentanyl and uses tylenol inbetween  Feels her incontinence is better now Hasn't needed the torsemide lately  Dr Shirlee Latch did stop the amlodipine and increased the hydralazine Does feel her edema is better and weight is down over 20#!!  No chest pain but does get a tightness in upper chest at times Was given antibiotics but they didn't help Is slowly improving Some cough still  Current Outpatient Prescriptions on File Prior to Visit  Medication Sig Dispense Refill  . aliskiren (TEKTURNA) 300 MG tablet Take 1 tablet (300 mg total) by mouth daily.  10 tablet  0  . aspirin 81 MG tablet Take 81 mg by mouth daily.        . carvedilol (COREG) 25 MG tablet Take 1 tablet (25 mg total) by mouth 2 (two) times daily with a meal.  180 tablet  3  . Casanthranol-Docusate Sodium (LAXATIVE PLUS STOOL SOFTENER) 30-100 MG CAPS Take 1 capsule by mouth 4 (four) times daily as needed.        . ciprofloxacin (CIPRO) 250 MG tablet 1 tab by mouth 3-4 days  As needed      . cromolyn (NASALCROM) 5.2 MG/ACT nasal spray 2 sprays by Nasal route daily.  180 Act  3  . fentaNYL (DURAGESIC - DOSED MCG/HR) 12 MCG/HR Place 1 patch (12.5 mcg total) onto the skin every 3 (three) days. along with the patch  10 patch  0  . fentaNYL (DURAGESIC - DOSED MCG/HR) 25 MCG/HR Place 1 patch (25 mcg total) onto the skin every 3 (three) days.  10 patch  0  . hydrALAZINE (APRESOLINE) 50 MG tablet Take 1 tablet (50 mg total) by mouth 3 (three) times daily.  90 tablet  6  . Liotrix (THYROLAR-1/4) 15 (3.1-12.5) MG (MCG) TABS Take 1 tablet by mouth daily.  30 each  11  . lisinopril (PRINIVIL,ZESTRIL) 40 MG tablet Take 40 mg by mouth daily.        . Lutein-Zeaxanthin 6-1 MG TABS Take 1 tablet by mouth daily.        . Melatonin 3 MG CAPS Take 1 capsule by mouth  daily.        . phenazopyridine (PYRIDIUM) 100 MG tablet Take 1 tablet (100 mg total) by mouth 3 (three) times daily as needed.  9 tablet  0  . potassium chloride SA (K-DUR,KLOR-CON) 20 MEQ tablet Take 20 mEq by mouth daily.        . Probiotic Product (ACIDOPHILUS PROBIOTIC BLEND) CAPS Take 1 capsule by mouth daily.        Lyman Bishop Protein Strong POWD by Does not apply route as needed.        . torsemide (DEMADEX) 10 MG tablet Take 1 tablet (10 mg total) by mouth daily.  30 tablet  6  . Valerian Root 450 MG CAPS Take 1 capsule by mouth daily.        . Vitamin Mixture (CHROMIUM PICOLINATE PO) Take by mouth as directed.          Allergies  Allergen Reactions  . Cefuroxime Axetil   . Cephalexin     Felt uncomfortable  . FAO:ZHYQMVHQION+GEXBMWUXL+KGMWNUUVOZ Acid+Aspartame   . Sulfonamide Derivatives     unknown    Past Medical History  Diagnosis Date  . HTN (  hypertension)     difficult to control  . Hypothyroidism   . OP (osteoporosis)   . Fibromyalgia   . Edema of both legs     chronic  . Degenerative disk disease     cervical  . Follicular lymphoma     Quiescent. Dr. Sunday Corn; Dr. Elenore Rota  . Meningioma     Left frontal  . GERD (gastroesophageal reflux disease)   . Obesity   . Cerebral hemorrhage 2004    hypertensive  . Hx: UTI (urinary tract infection)   . Renal artery aneurysm     with stenosis-right; followed by Dr. Gilda Crease  . Diastolic CHF, chronic     Echo (9-11) with EF 65-70%, mild LVH, mid AI, Mild MR, PA systolic pressure >    Past Surgical History  Procedure Date  . Cataract extraction 10/05 and 4/07    Left then Right [facility])  . Abdominal hysterectomy 1958  . Cerebral hemorrhage 10/2002  . Cervical fusion 1985  . Rectocele repair 1982  . Cystocele repair 1982    Family History  Problem Relation Age of Onset  . Stroke Father   . Cancer Mother     Myeloma  . Hypertension      family    History   Social History  . Marital Status:  Married    Spouse Name: N/A    Number of Children: N/A  . Years of Education: N/A   Occupational History  . Retired- Scientist, clinical (histocompatibility and immunogenetics) and taught    Social History Main Topics  . Smoking status: Never Smoker   . Smokeless tobacco: Never Used   Comment: Quit smoking 50 years ago  . Alcohol Use: No  . Drug Use: Not on file  . Sexually Active: Not on file   Other Topics Concern  . Not on file   Social History Narrative  . No narrative on file   Review of Systems Sleep is still not great--uses valerian root, melatonin and tylenol PM. Asked her to stop the benedryl Appetite is still okay Bowels have been okay---uses grape seed oil and eats prunes     Objective:   Physical Exam  Constitutional: She appears well-developed and well-nourished. No distress.  Neck: Normal range of motion.  Cardiovascular: Normal rate and regular rhythm.  Exam reveals no gallop.   Murmur heard.      Soft systolic murmur at base  Pulmonary/Chest: Effort normal. No respiratory distress. She has no wheezes. She has no rales.       Slightly decreased breath sounds ?hint of exp wheeze  Abdominal: Soft. There is no tenderness.  Musculoskeletal: She exhibits edema.       Huge calves are smaller Still tense  Lymphadenopathy:    She has no cervical adenopathy.  Psychiatric: She has a normal mood and affect. Her behavior is normal. Judgment and thought content normal.          Assessment & Plan:

## 2011-05-17 NOTE — Assessment & Plan Note (Signed)
BP Readings from Last 3 Encounters:  05/17/11 138/78  04/10/11 148/82  01/11/11 148/84   Better now and edema down off the amlodipine Lab Results  Component Value Date   CREATININE 0.76 04/24/2011

## 2011-05-17 NOTE — Assessment & Plan Note (Signed)
Doing okay on the fentanyl and tylenol

## 2011-05-17 NOTE — Assessment & Plan Note (Signed)
Improved now.   

## 2011-05-27 ENCOUNTER — Telehealth: Payer: Self-pay | Admitting: *Deleted

## 2011-05-27 NOTE — Telephone Encounter (Signed)
Pt's daughter called stating pt is "having trouble staying awake." She "seems very groggy past few days," BP fluctuating systolic 90s-135. She was seen 04/10/11, amlodipine was replaced with hydralazine 50 TID due to edema, also has Torsemide 10mg  daily. Her swelling had improved so she stopped Torsemide, "only took occasionally," and now her swelling in legs is increasing, she has been on Torsemide 10mg  daily for 1-1 1/2 weeks now, incr UOP but not much change in legs. She saw Dr. Alphonsus Sias 05/17/11, did not see any mention of her recent fatigue. She does take fentanyl patch (has had for a while) and she recently stopped oxycodone. Daughter trying to find cause of her incr fatigue. Last labs 12/19, normal. Do you want pt to have labs or appt scheduled? Please adivse.

## 2011-05-28 ENCOUNTER — Other Ambulatory Visit: Payer: Self-pay

## 2011-05-28 DIAGNOSIS — E039 Hypothyroidism, unspecified: Secondary | ICD-10-CM

## 2011-05-28 DIAGNOSIS — Z79899 Other long term (current) drug therapy: Secondary | ICD-10-CM

## 2011-05-28 DIAGNOSIS — I509 Heart failure, unspecified: Secondary | ICD-10-CM

## 2011-05-28 DIAGNOSIS — I10 Essential (primary) hypertension: Secondary | ICD-10-CM

## 2011-05-28 DIAGNOSIS — I5032 Chronic diastolic (congestive) heart failure: Secondary | ICD-10-CM

## 2011-05-28 NOTE — Telephone Encounter (Signed)
Would get CMET, CBC, TSH.  If SBP frequently in the 90s, can decrease hydralazine to 25 mg tid.

## 2011-05-28 NOTE — Telephone Encounter (Signed)
Notified the daughter need to have her cut the hydralazine to 25 mg tid being her systolic blood pressure running in the 90's..  The patient will come for blood work tomorrow.

## 2011-05-29 ENCOUNTER — Ambulatory Visit (INDEPENDENT_AMBULATORY_CARE_PROVIDER_SITE_OTHER): Payer: Medicare Other | Admitting: *Deleted

## 2011-05-29 DIAGNOSIS — E039 Hypothyroidism, unspecified: Secondary | ICD-10-CM

## 2011-05-29 DIAGNOSIS — I10 Essential (primary) hypertension: Secondary | ICD-10-CM

## 2011-05-29 DIAGNOSIS — Z79899 Other long term (current) drug therapy: Secondary | ICD-10-CM

## 2011-05-29 DIAGNOSIS — I5032 Chronic diastolic (congestive) heart failure: Secondary | ICD-10-CM

## 2011-05-29 DIAGNOSIS — I509 Heart failure, unspecified: Secondary | ICD-10-CM

## 2011-05-30 LAB — COMPREHENSIVE METABOLIC PANEL
ALT: 13 IU/L (ref 0–32)
AST: 12 IU/L (ref 0–40)
Albumin/Globulin Ratio: 1.6 (ref 1.1–2.5)
Alkaline Phosphatase: 67 IU/L (ref 25–165)
BUN/Creatinine Ratio: 27 — ABNORMAL HIGH (ref 11–26)
GFR calc Af Amer: 95 mL/min/{1.73_m2} (ref >59)
GFR calc non Af Amer: 82 mL/min/{1.73_m2} (ref >59)
Potassium: 4.2 mmol/L (ref 3.5–5.2)
Sodium: 141 mmol/L (ref 134–144)
Total Bilirubin: 0.3 mg/dL (ref 0.0–1.2)

## 2011-05-30 LAB — CBC WITH DIFFERENTIAL/PLATELET
Basophils Absolute: 0 10*3/uL (ref 0.0–0.2)
Basos: 1 % (ref 0–3)
Eosinophils Absolute: 0.1 10*3/uL (ref 0.0–0.4)
Hemoglobin: 13.2 g/dL (ref 11.1–15.9)
MCH: 29.7 pg (ref 26.6–33.0)
MCHC: 33.9 g/dL (ref 31.5–35.7)
MCV: 88 fL (ref 79–97)
Monocytes Absolute: 0.6 10*3/uL (ref 0.1–1.0)
Neutrophils Absolute: 4.4 10*3/uL (ref 1.8–7.8)
RBC: 4.44 x10E6/uL (ref 3.77–5.28)

## 2011-06-03 ENCOUNTER — Other Ambulatory Visit: Payer: Self-pay | Admitting: *Deleted

## 2011-06-03 MED ORDER — FENTANYL 25 MCG/HR TD PT72: 1.0000 | MEDICATED_PATCH | TRANSDERMAL | Status: DC

## 2011-06-03 MED ORDER — FENTANYL 12 MCG/HR TD PT72: 1.0000 | MEDICATED_PATCH | TRANSDERMAL | Status: DC

## 2011-06-03 NOTE — Telephone Encounter (Signed)
Spoke with daughter and advised results.  

## 2011-06-04 ENCOUNTER — Telehealth: Payer: Self-pay | Admitting: Cardiovascular Disease

## 2011-06-04 MED ORDER — HYDRALAZINE HCL 50 MG PO TABS: 50.0000 mg | ORAL_TABLET | Freq: Three times a day (TID) | ORAL | Status: DC

## 2011-06-04 NOTE — Telephone Encounter (Signed)
Refill sent for hydralazine 25 mg one tablet tid.

## 2011-06-04 NOTE — Telephone Encounter (Signed)
Pt daughter called needs a script for her hydralazine sent in for 25 mg 3 times daily to [facility] [phone]

## 2011-06-10 ENCOUNTER — Telehealth: Payer: Self-pay | Admitting: Internal Medicine

## 2011-06-10 NOTE — Telephone Encounter (Signed)
Office Message from Date: 06/10/2011 12:00:00 AM Time of Call: [phone] Faxed To: [place] (Daytime Triage) Caller: [name] Fax Number: [phone] Facility: N/A Patient: Kayla, [name] DOB: 14-Jun-1923 Phone: [phone] Provider: Karie Schwalbe Message: [name] calling to have [facility] retry fax [phone] for order for compression machine for patients legs because they did not get the order before. Regarding Appointment: Appt Date: Appt Time: Unknown Provider: Reason: Details: Outcome: Message Taken by: Valinda Hoar Call-

## 2011-06-11 NOTE — Telephone Encounter (Signed)
Spoke with patient and advised that I would re-fax order back to [facility].

## 2011-06-27 ENCOUNTER — Other Ambulatory Visit: Payer: Self-pay

## 2011-06-27 MED ORDER — FENTANYL 12 MCG/HR TD PT72: 1.0000 | MEDICATED_PATCH | TRANSDERMAL | Status: DC

## 2011-06-27 MED ORDER — FENTANYL 25 MCG/HR TD PT72: 1.0000 | MEDICATED_PATCH | TRANSDERMAL | Status: DC

## 2011-06-27 NOTE — Telephone Encounter (Signed)
Left message on machine that rx is ready for pick-up, and it will be at our front desk.  

## 2011-06-27 NOTE — Telephone Encounter (Signed)
Patient's daughter called to get a refill written prescription for the Fentanyl patches 12.5 and 25mg  strengths.  Please call [name] when ready to pick up.

## 2011-07-12 ENCOUNTER — Encounter: Payer: Self-pay | Admitting: Cardiology

## 2011-07-12 ENCOUNTER — Ambulatory Visit (INDEPENDENT_AMBULATORY_CARE_PROVIDER_SITE_OTHER): Payer: Medicare Other | Admitting: Cardiology

## 2011-07-12 DIAGNOSIS — R0602 Shortness of breath: Secondary | ICD-10-CM

## 2011-07-12 DIAGNOSIS — Z79899 Other long term (current) drug therapy: Secondary | ICD-10-CM

## 2011-07-12 DIAGNOSIS — I509 Heart failure, unspecified: Secondary | ICD-10-CM

## 2011-07-12 DIAGNOSIS — I10 Essential (primary) hypertension: Secondary | ICD-10-CM

## 2011-07-12 DIAGNOSIS — I5032 Chronic diastolic (congestive) heart failure: Secondary | ICD-10-CM

## 2011-07-12 MED ORDER — TORSEMIDE 20 MG PO TABS: 20.0000 mg | ORAL_TABLET | Freq: Every day | ORAL | Status: DC

## 2011-07-12 MED ORDER — POTASSIUM CHLORIDE CRYS ER 20 MEQ PO TBCR: 20.0000 meq | EXTENDED_RELEASE_TABLET | Freq: Two times a day (BID) | ORAL | Status: DC

## 2011-07-12 NOTE — Patient Instructions (Addendum)
Need to increase Torsemide to 20 mg take one tablet daily.  Take potassium chloride 20 meq take one tablet twice a day. Follow up with Dr. Shirlee Latch in two months.  Need to have BMET/Magnesium level/BNP in two weeks.

## 2011-07-14 NOTE — Progress Notes (Signed)
PCP: Dr. Alphonsus Sias  78 yo with history of HTN, obesity, and chronic lower extremity edema/lymphedema presents for cardiology followup.  She has had leg swelling bilaterally for > 10 years.  She has been seen by vascular surgery in [place] and is thought to have a component of venous insufficiency and, I suspect, lymphedema. She cannot wear compression stockings (unable to put them on).  She has been short of breath after walking about 30 feet chronically.  She sleeps with 3 pillows at night, mainly for GERD rather than orthopnea.  No chest pain/tightness.  SBP at home has been running in the 120s-130s per patient.  Weight is up 10 lbs.  She has been taking her torsemide on most days.    Labs (8/11): BNP 50 Labs (9/11): LDL 133, HDL 53, K 4.0, creatinine 0.8, LFTs normal, TSH normal Labs (10/11): K 4.1, creatinine 0.78 Labs (2/12): K 3.9, creatinine 0.81, BNP 85, calcium 11 Labs (3/12): PTH level elevated Labs (9/12): K 4.3, creatinine 0.78 Labs (1/13): TSH normal, K 4.2, creatinine 0.62  Allergies (verified):  1)  ! Augmentin 2)  ! Cephalexin 3)  ! Ceftin 4)  ! Sulfa  Family History: Dad died @80  CVAs Mom died @91  myeloma Only child No CAD, DM HTN in family No cancer  Social History: Retired--farmed/taught  Lives in [place] Married   3 sons/1 daughter Quit smoking 50 years ago Alcohol use-no  ROS: All systems reviewed and negative except as per HPI.   Past Medical History: 1. Hypertension: Difficult to control.  2. Hypothyroidism 3. Osteoporosis 4. Fibromyalgia 5. Chronic lower extremity edema/lymphedema.  6. Cervical disk disease 7. Follicular lymphoma: Quiescent.  Dr Cherene Altes, Dr Elenore Rota 8. L frontal meningioma 9. GERD 10. Obesity 11. Cerebral hemorrhage: 2004, hypertensive.  12. H/o Urinary Tract Infection 13. Right renal artery aneurysm w/o stenosis, followed by Dr. Gilda Crease 14. Diastolic CHF: Echo (9/11) with EF 65-70%, mild LVH, mid AI, mild MR, PA systolic  pressure > 40 mmHg.  15. Primary hyperparathyroidism  Current Outpatient Prescriptions  Medication Sig Dispense Refill  . aliskiren (TEKTURNA) 300 MG tablet Take 1 tablet (300 mg total) by mouth daily.  10 tablet  0  . aspirin 81 MG tablet Take 81 mg by mouth daily.        . carvedilol (COREG) 25 MG tablet Take 1 tablet (25 mg total) by mouth 2 (two) times daily with a meal.  180 tablet  3  . Casanthranol-Docusate Sodium (LAXATIVE PLUS STOOL SOFTENER) 30-100 MG CAPS Take 1 capsule by mouth 4 (four) times daily as needed.        . ciprofloxacin (CIPRO) 250 MG tablet 1 tab by mouth 3-4 days  As needed      . cromolyn (NASALCROM) 5.2 MG/ACT nasal spray 2 sprays by Nasal route daily.  180 Act  3  . fentaNYL (DURAGESIC - DOSED MCG/HR) 12 MCG/HR Place 1 patch (12.5 mcg total) onto the skin every 3 (three) days. along with the patch  10 patch  0  . fentaNYL (DURAGESIC - DOSED MCG/HR) 25 MCG/HR Place 1 patch (25 mcg total) onto the skin every 3 (three) days.  10 patch  0  . hydrALAZINE (APRESOLINE) 50 MG tablet Take 25 mg by mouth 3 (three) times daily.      . Liotrix (THYROLAR-1/4) 15 (3.1-12.5) MG (MCG) TABS Take 1 tablet by mouth daily.  30 each  11  . lisinopril (PRINIVIL,ZESTRIL) 40 MG tablet Take 40 mg by mouth daily.        Marland Kitchen  Lutein-Zeaxanthin 6-1 MG TABS Take 1 tablet by mouth daily.        . Melatonin 3 MG CAPS Take 1 capsule by mouth daily.        . phenazopyridine (PYRIDIUM) 100 MG tablet Take 1 tablet (100 mg total) by mouth 3 (three) times daily as needed.  30 tablet  5  . potassium chloride SA (K-DUR,KLOR-CON) 20 MEQ tablet Take 1 tablet (20 mEq total) by mouth 2 (two) times daily.  180 tablet  3  . Probiotic Product (ACIDOPHILUS PROBIOTIC BLEND) CAPS Take 1 capsule by mouth daily.        Lyman Bishop Protein Strong POWD by Does not apply route as needed.        . torsemide (DEMADEX) 10 MG tablet Take 2 tablets (20 mg total) by mouth daily.  60 tablet  6  . Valerian Root 450 MG CAPS  Take 1 capsule by mouth daily.        . Vitamin Mixture (CHROMIUM PICOLINATE PO) Take by mouth as directed.        . torsemide (DEMADEX) 20 MG tablet Take 1 tablet (20 mg total) by mouth daily.  90 tablet  3    BP 148/87  Pulse 81  Ht 5\' 4"  (1.626 m)  Wt 285 lb (129.275 kg)  BMI 48.92 kg/m2  SpO2 91% General:  Well developed, well nourished, in no acute distress.  Obese. Neck:  Neck supple, JVP 8-9 cm. No masses, thyromegaly or abnormal cervical nodes. Lungs:  Crackles at right base.  Heart:  Non-displaced PMI, chest non-tender; regular rate and rhythm, S1, S2 without rubs or gallops. 1/6 SEM RUSB.  Carotid upstroke normal, no bruit.  2+ pitting edema to knees bilaterally but there is also a significant non-pitting component to edema that may be consistent with lymphedema.  Abdomen:  Bowel sounds positive; abdomen soft and non-tender without masses, organomegaly, or hernias noted. No hepatosplenomegaly. Extremities:  No clubbing or cyanosis. Neurologic:  Alert and oriented x 3. Psych:  Normal affect.

## 2011-07-14 NOTE — Assessment & Plan Note (Signed)
BP has been at goal when she checks at home.

## 2011-07-14 NOTE — Assessment & Plan Note (Signed)
Patient appears volume overload with mild JVP elevation in addition to her chronic LE edema.  I will increase torsemide to 20 mg daily and increase KCl to 20 mEq bid.  BMET/BNP in 2 wks.

## 2011-07-26 ENCOUNTER — Other Ambulatory Visit: Payer: Medicare Other

## 2011-07-29 ENCOUNTER — Ambulatory Visit (INDEPENDENT_AMBULATORY_CARE_PROVIDER_SITE_OTHER): Payer: Medicare Other

## 2011-07-29 DIAGNOSIS — Z79899 Other long term (current) drug therapy: Secondary | ICD-10-CM

## 2011-07-29 DIAGNOSIS — R0602 Shortness of breath: Secondary | ICD-10-CM

## 2011-07-29 DIAGNOSIS — I509 Heart failure, unspecified: Secondary | ICD-10-CM

## 2011-07-29 DIAGNOSIS — I5032 Chronic diastolic (congestive) heart failure: Secondary | ICD-10-CM

## 2011-07-30 ENCOUNTER — Telehealth: Payer: Self-pay

## 2011-07-30 LAB — BASIC METABOLIC PANEL
Calcium: 11 mg/dL — ABNORMAL HIGH (ref 8.6–10.2)
Creatinine, Ser: 0.55 mg/dL — ABNORMAL LOW (ref 0.57–1.00)
GFR calc Af Amer: 99 mL/min/{1.73_m2} (ref >59)
GFR calc non Af Amer: 86 mL/min/{1.73_m2} (ref >59)
Potassium: 4.7 mmol/L (ref 3.5–5.2)
Sodium: 140 mmol/L (ref 134–144)

## 2011-07-30 LAB — MAGNESIUM: Magnesium: 1.8 mg/dL (ref 1.6–2.6)

## 2011-07-30 NOTE — Telephone Encounter (Signed)
The patient's daughter is calling for a refill on amlodipine for Kayla [name]. I told the daughter [name]) that the amlodipine was changed back in Dec. 2012. There is a phone note regarding that change on Jan. 21, 2013 where the patient had called c/o swelling and fatigue. The daughter states, "I screwed up on her medications and have been giving her the amlodipine and not the Tekturna." She has been doing the amlodipine since Dec. 2012.  Just an FYI she had some labs done on 07/29/2011 well which may explain some of her problem. I told the daughter someone will call her with the lab results and medication suggestions.

## 2011-07-30 NOTE — Telephone Encounter (Signed)
Stop the amlodipine (? Causing lower leg swelling) and start back on Tekturna 300 mg daily.  Will need BMET in 1 week after starting Tekturna.  Labs yesterday were ok.

## 2011-07-31 ENCOUNTER — Other Ambulatory Visit: Payer: Self-pay

## 2011-07-31 DIAGNOSIS — Z79899 Other long term (current) drug therapy: Secondary | ICD-10-CM

## 2011-07-31 DIAGNOSIS — I5032 Chronic diastolic (congestive) heart failure: Secondary | ICD-10-CM

## 2011-07-31 DIAGNOSIS — I509 Heart failure, unspecified: Secondary | ICD-10-CM

## 2011-07-31 DIAGNOSIS — I10 Essential (primary) hypertension: Secondary | ICD-10-CM

## 2011-07-31 NOTE — Telephone Encounter (Signed)
[id number]

## 2011-07-31 NOTE — Telephone Encounter (Signed)
Notified daughter [name]) regarding instructions on stopping the amlodipine and needs to start tekturna. The patient will come for blood work on August 07, 2011.

## 2011-08-05 ENCOUNTER — Other Ambulatory Visit: Payer: Medicare Other

## 2011-08-07 ENCOUNTER — Other Ambulatory Visit: Payer: Self-pay

## 2011-08-07 ENCOUNTER — Other Ambulatory Visit: Payer: Medicare Other

## 2011-08-07 MED ORDER — FENTANYL 12 MCG/HR TD PT72: 1.0000 | MEDICATED_PATCH | TRANSDERMAL | Status: DC

## 2011-08-07 MED ORDER — FENTANYL 25 MCG/HR TD PT72: 1.0000 | MEDICATED_PATCH | TRANSDERMAL | Status: DC

## 2011-08-07 NOTE — Telephone Encounter (Signed)
Kayla [name] left v/m requesting written rx for Fentanyl 25 mcg and 12 mcg. Pt last seen 05/17/11. [name] can be reached at [phone] when rx ready for pick up.

## 2011-08-07 NOTE — Telephone Encounter (Signed)
Spoke with daughter and advised results.  

## 2011-08-15 ENCOUNTER — Other Ambulatory Visit: Payer: Medicare Other

## 2011-08-15 DIAGNOSIS — I10 Essential (primary) hypertension: Secondary | ICD-10-CM

## 2011-08-15 DIAGNOSIS — Z79899 Other long term (current) drug therapy: Secondary | ICD-10-CM

## 2011-08-15 DIAGNOSIS — I5032 Chronic diastolic (congestive) heart failure: Secondary | ICD-10-CM

## 2011-08-15 DIAGNOSIS — I509 Heart failure, unspecified: Secondary | ICD-10-CM

## 2011-08-16 LAB — BASIC METABOLIC PANEL
BUN: 21 mg/dL (ref 8–27)
CO2: 21 mmol/L (ref 20–32)
Chloride: 103 mmol/L (ref 97–108)
Creatinine, Ser: 0.69 mg/dL (ref 0.57–1.00)
Glucose: 99 mg/dL (ref 65–99)

## 2011-09-02 ENCOUNTER — Other Ambulatory Visit: Payer: Self-pay

## 2011-09-02 MED ORDER — FENTANYL 12 MCG/HR TD PT72: 1.0000 | MEDICATED_PATCH | TRANSDERMAL | Status: DC

## 2011-09-02 MED ORDER — FENTANYL 25 MCG/HR TD PT72: 1.0000 | MEDICATED_PATCH | TRANSDERMAL | Status: DC

## 2011-09-02 NOTE — Telephone Encounter (Signed)
Left message on machine that rx is ready for pick-up, and it will be at our front desk.  

## 2011-09-02 NOTE — Telephone Encounter (Signed)
[name] request written rx for Fentanyl 12 mcg and 25 mcg. Sherron Monday can be reached [phone] when rxs ready for pick up. Pt last seen 05/17/11.

## 2011-09-08 IMAGING — US US RENAL KIDNEY
1 series · 17 of 25 positions shown · non-contrast
Comparison: none

REASON FOR EXAM: UTI
COMMENTS:

[Series 1: us renal kidney · 17 of 28 slices shown]
[im 1/28]
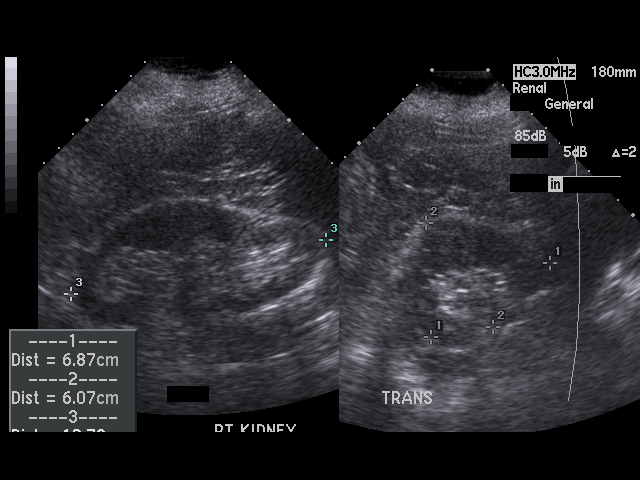
[im 3/28]
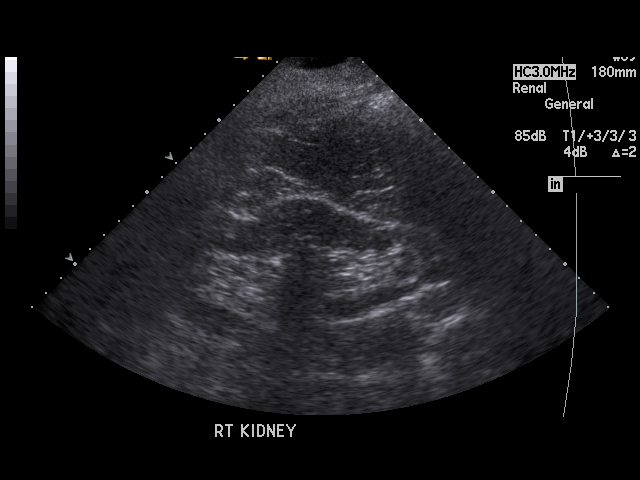
[im 4/28]
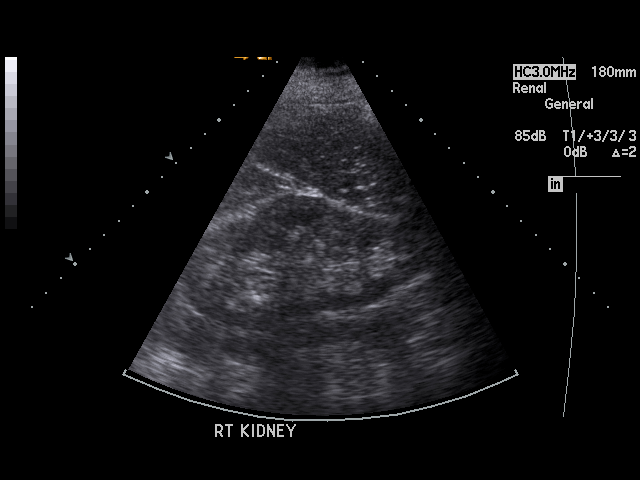
[im 6/28]
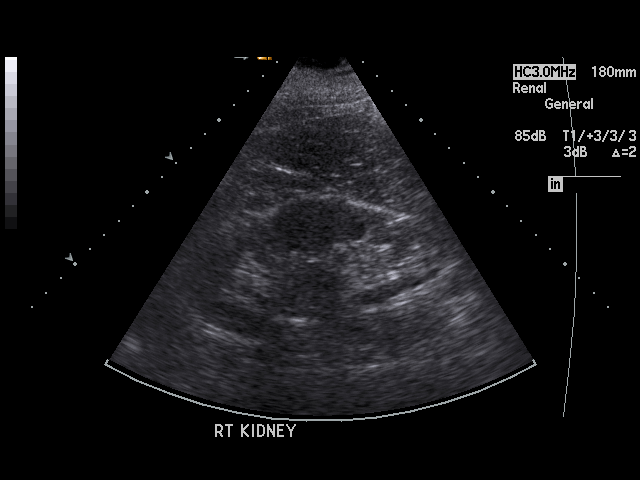
[im 7/28]
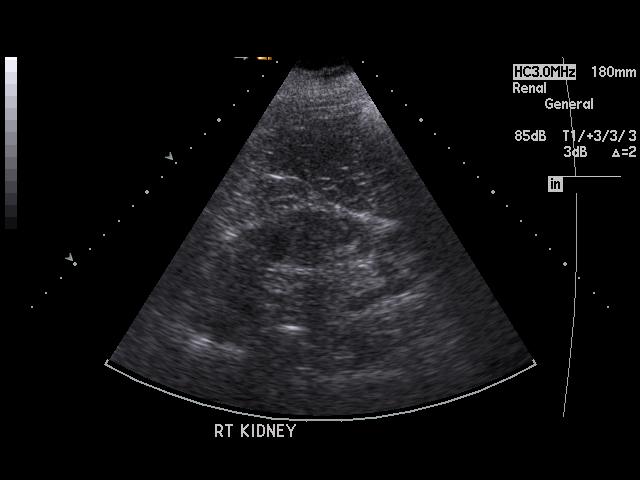
[im 10/28]
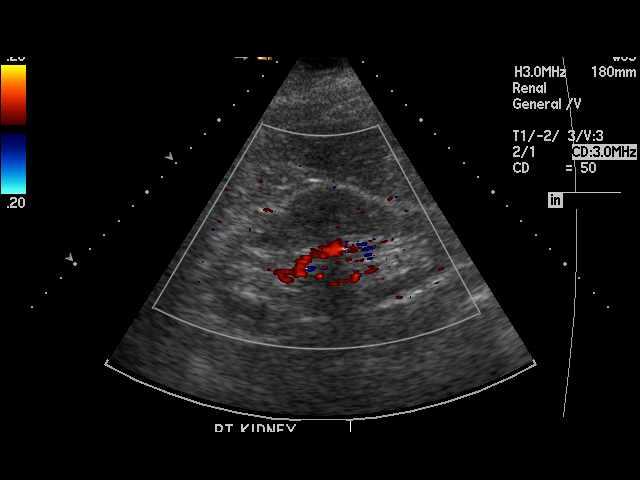
[im 11/28]
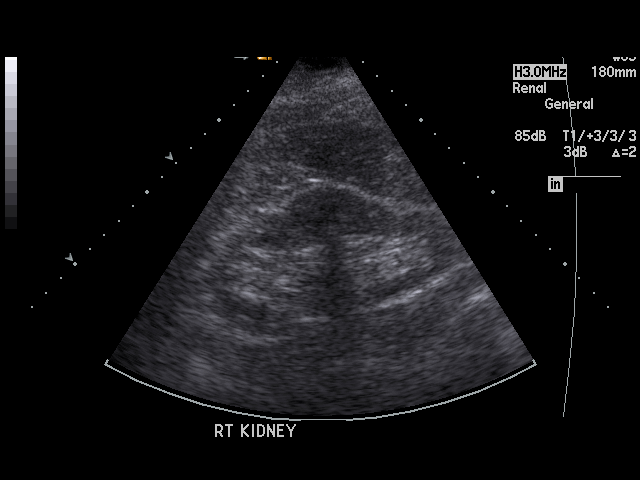
[im 13/28]
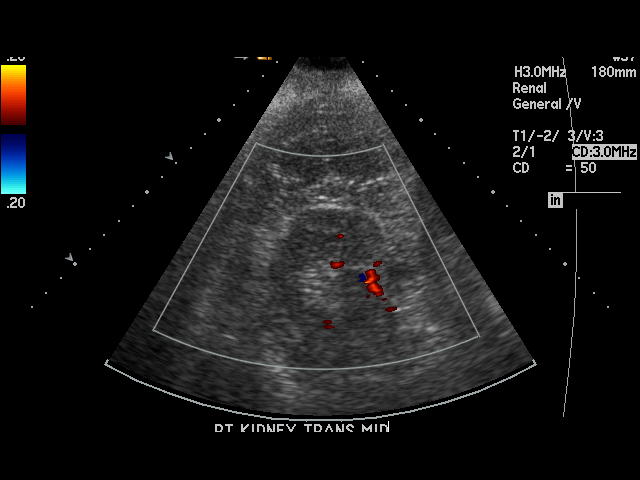
[im 14/28]
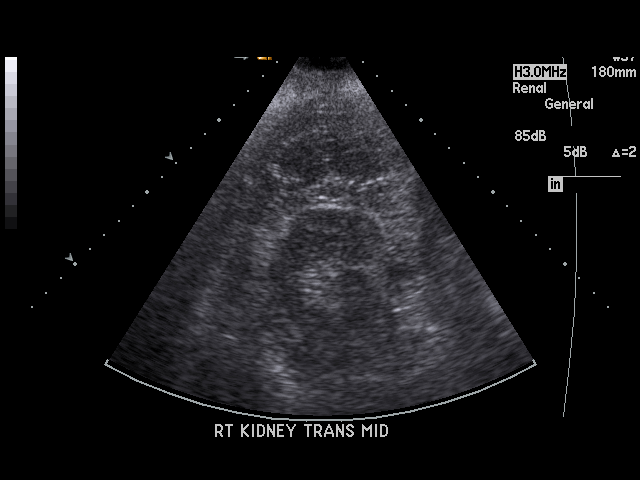
[im 15/28]
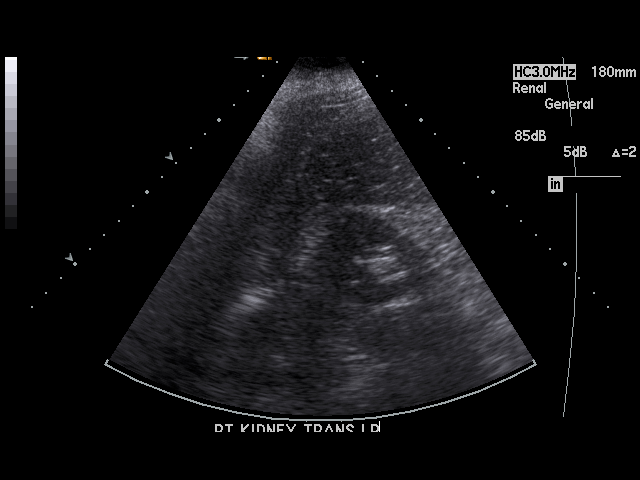
[im 17/28]
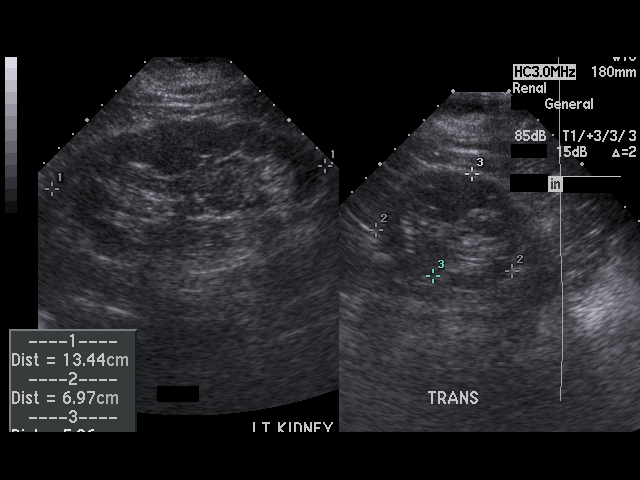
[im 19/28]
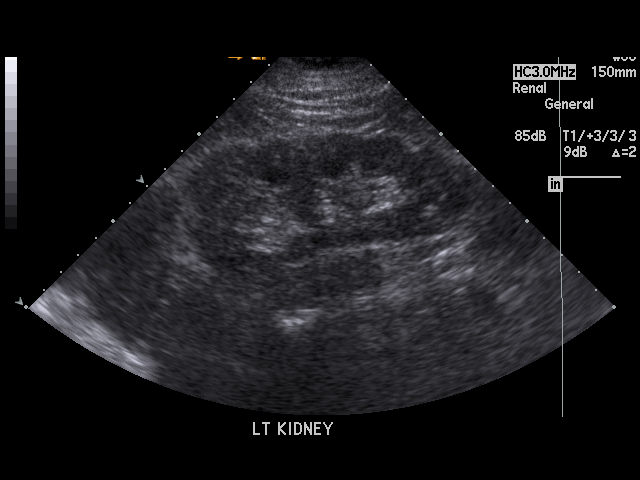
[im 21/28]
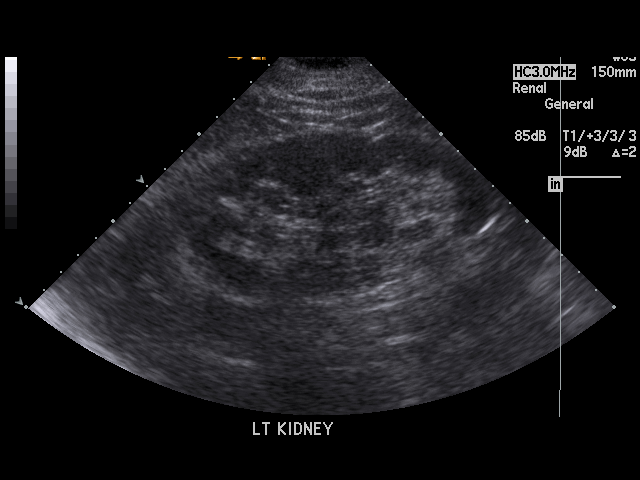
[im 22/28]
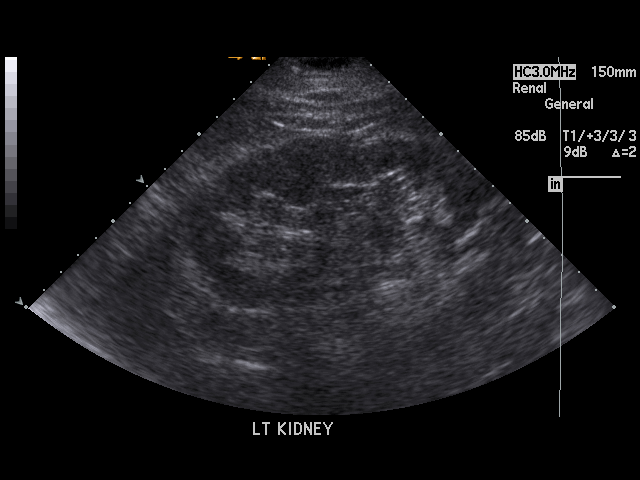
[im 24/28]
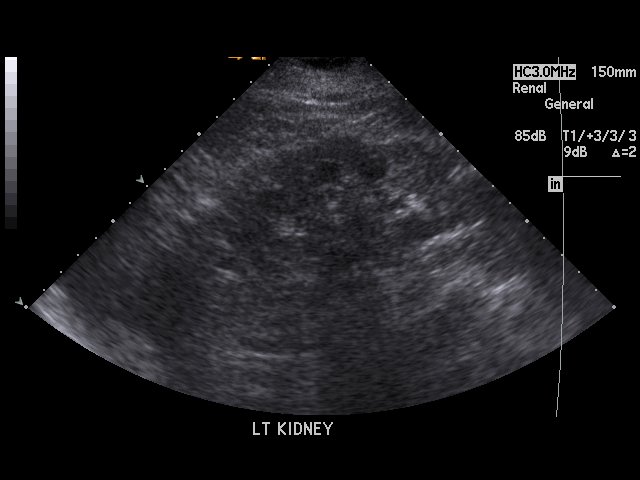
[im 25/28]
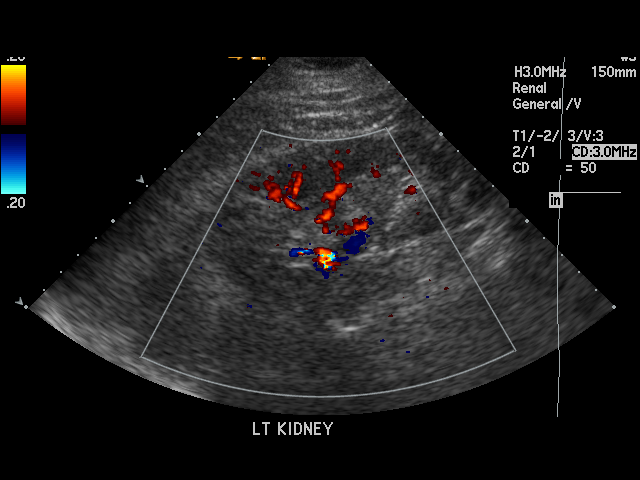
[im 28/28]
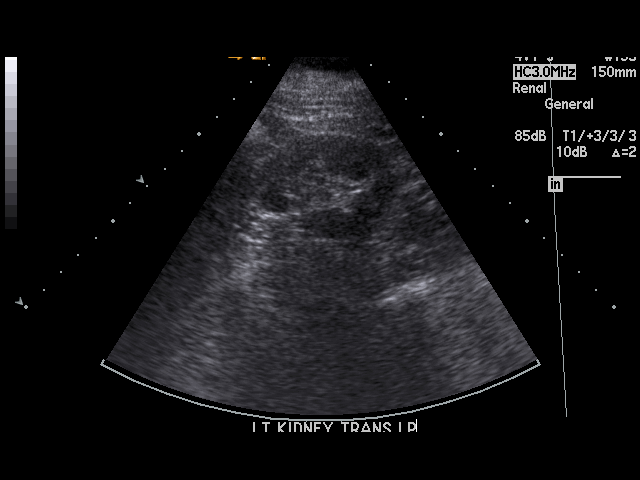

[17 of 25 positions shown; findings below may reference images not displayed]

PROCEDURE:     US  - US KIDNEY  - April 05, 2009  [DATE]

RESULT:     The right kidney measures 12.78 cm x 6.07 cm x 6.87 cm. The left
kidney measures 13.44 cm x 6.97 cm x 5.36 cm. The renal cortical margins
bilaterally are smooth. No solid or cystic renal mass lesions are seen. No
renal calcifications are noted. The visualized portion of the urinary
bladder is normal in appearance. There is no hydronephrosis. No ascites is
seen.
IMPRESSION: No significant abnormalities are noted.

## 2011-09-13 ENCOUNTER — Encounter: Payer: Self-pay | Admitting: Cardiovascular Disease

## 2011-09-13 ENCOUNTER — Ambulatory Visit (INDEPENDENT_AMBULATORY_CARE_PROVIDER_SITE_OTHER): Payer: Medicare Other | Admitting: Cardiovascular Disease

## 2011-09-13 ENCOUNTER — Ambulatory Visit: Payer: Medicare Other | Admitting: Cardiology

## 2011-09-13 VITALS — BP 169/74 | HR 73 | Ht 63.0 in | Wt 273.0 lb

## 2011-09-13 DIAGNOSIS — I5032 Chronic diastolic (congestive) heart failure: Secondary | ICD-10-CM

## 2011-09-13 DIAGNOSIS — R0989 Other specified symptoms and signs involving the circulatory and respiratory systems: Secondary | ICD-10-CM

## 2011-09-13 DIAGNOSIS — I509 Heart failure, unspecified: Secondary | ICD-10-CM

## 2011-09-13 DIAGNOSIS — I10 Essential (primary) hypertension: Secondary | ICD-10-CM

## 2011-09-13 NOTE — Progress Notes (Signed)
HPI  This is an 78 year old female who is here today for a followup visit. She is a patient of Dr. Shirlee Latch but will be establishing with me. She has a history of HTN, morbid obesity, chronic diastolic heart failure and chronic lower extremity edema/lymphedema. She has had leg swelling bilaterally for > 10 years. She has been seen by vascular surgery in [place] and is thought to have a component of venous insufficiency and, I suspect, lymphedema. She cannot wear compression stockings (unable to put them on). She has been short of breath after walking about 30 feet chronically. She sleeps with 3 pillows at night, mainly for GERD rather than orthopnea. No chest pain/tightness. SBP at home has been running in the 120s-130s per patient.  During her last visit, her weight was up about 12 pounds. The dose of torsemide was increased. She improved since then. Her weight is back to baseline.  Allergies  Allergen Reactions  . Amoxicillin-Pot Clavulanate   . Cefuroxime Axetil   . Cephalexin     Felt uncomfortable  . Sulfonamide Derivatives     unknown     Current Outpatient Prescriptions on File Prior to Visit  Medication Sig Dispense Refill  . aliskiren (TEKTURNA) 300 MG tablet Take 1 tablet (300 mg total) by mouth daily.  10 tablet  0  . aspirin 81 MG tablet Take 81 mg by mouth daily.        . carvedilol (COREG) 25 MG tablet Take 1 tablet (25 mg total) by mouth 2 (two) times daily with a meal.  180 tablet  3  . Casanthranol-Docusate Sodium (LAXATIVE PLUS STOOL SOFTENER) 30-100 MG CAPS Take 1 capsule by mouth 4 (four) times daily as needed.        . ciprofloxacin (CIPRO) 250 MG tablet 1 tab by mouth 3-4 days  As needed      . fentaNYL (DURAGESIC - DOSED MCG/HR) 12 MCG/HR Place 1 patch (12.5 mcg total) onto the skin every 3 (three) days. along with the patch  10 patch  0  . fentaNYL (DURAGESIC - DOSED MCG/HR) 25 MCG/HR Place 1 patch (25 mcg total) onto the skin every 3 (three) days.  10  patch  0  . hydrALAZINE (APRESOLINE) 50 MG tablet Take 25 mg by mouth 3 (three) times daily.      . Liotrix (THYROLAR-1/4) 15 (3.1-12.5) MG (MCG) TABS Take 1 tablet by mouth daily.  30 each  11  . lisinopril (PRINIVIL,ZESTRIL) 40 MG tablet Take 40 mg by mouth daily.        . Melatonin 3 MG CAPS Take 1 capsule by mouth daily.        . potassium chloride SA (K-DUR,KLOR-CON) 20 MEQ tablet Take 1 tablet (20 mEq total) by mouth 2 (two) times daily.  180 tablet  3  . Silver Protein Strong POWD by Does not apply route as needed.       . torsemide (DEMADEX) 20 MG tablet Take 1 tablet (20 mg total) by mouth daily.  90 tablet  3  . Valerian Root 450 MG CAPS Take 1 capsule by mouth daily.        . Vitamin Mixture (CHROMIUM PICOLINATE PO) Take by mouth as directed.        . cromolyn (NASALCROM) 5.2 MG/ACT nasal spray 2 sprays by Nasal route daily.  180 Act  3  . Lutein-Zeaxanthin 6-1 MG TABS Take 1 tablet by mouth daily.        . phenazopyridine (  PYRIDIUM) 100 MG tablet Take 1 tablet (100 mg total) by mouth 3 (three) times daily as needed.  30 tablet  5  . Probiotic Product (ACIDOPHILUS PROBIOTIC BLEND) CAPS Take 1 capsule by mouth daily.        Marland Kitchen DISCONTD: torsemide (DEMADEX) 10 MG tablet Take 2 tablets (20 mg total) by mouth daily.  60 tablet  6     Past Medical History  Diagnosis Date  . HTN (hypertension)     difficult to control  . Hypothyroidism   . OP (osteoporosis)   . Fibromyalgia   . Edema of both legs     chronic  . Degenerative disk disease     cervical  . Follicular lymphoma     Quiescent. Dr. Sunday Corn; Dr. Elenore Rota  . Meningioma     Left frontal  . GERD (gastroesophageal reflux disease)   . Obesity   . Cerebral hemorrhage 2004    hypertensive  . Hx: UTI (urinary tract infection)   . Renal artery aneurysm     with stenosis-right; followed by Dr. Gilda Crease  . Diastolic CHF, chronic     Echo (9-11) with EF 65-70%, mild LVH, mid AI, Mild MR, PA systolic pressure >      Past Surgical History  Procedure Date  . Cataract extraction 10/05 and 4/07    Left then Right [facility])  . Abdominal hysterectomy 1958  . Cerebral hemorrhage 10/2002  . Cervical fusion 1985  . Rectocele repair 1982  . Cystocele repair 1982     Family History  Problem Relation Age of Onset  . Stroke Father   . Cancer Mother     Myeloma  . Hypertension      family     History   Social History  . Marital Status: Married    Spouse Name: N/A    Number of Children: N/A  . Years of Education: N/A   Occupational History  . Retired- Scientist, clinical (histocompatibility and immunogenetics) and taught    Social History Main Topics  . Smoking status: Never Smoker   . Smokeless tobacco: Never Used   Comment: Quit smoking 50 years ago  . Alcohol Use: No  . Drug Use: Not on file  . Sexually Active: Not on file   Other Topics Concern  . Not on file   Social History Narrative  . No narrative on file     PHYSICAL EXAM   BP 169/74  Pulse 73  Ht 5\' 3"  (1.6 m)  Wt 273 lb (123.832 kg)  BMI 48.36 kg/m2 Constitutional: She is oriented to person, place, and time. She is morbidly obese. No distress.  HENT: No nasal discharge.  Head: Normocephalic and atraumatic.  Eyes: Pupils are equal and round. Right eye exhibits no discharge. Left eye exhibits no discharge.  Neck: Normal range of motion. Neck supple. No JVD present. No thyromegaly present.  Cardiovascular: Normal rate, regular rhythm, normal heart sounds. Exam reveals no gallop and no friction rub. There is a 2/6 systolic ejection murmur at the aortic area which is early peaking. Pulmonary/Chest: Effort normal and breath sounds normal. No stridor. No respiratory distress. She has no wheezes. She has no rales. She exhibits no tenderness.  Abdominal: Soft. Bowel sounds are normal. She exhibits no distension. There is no tenderness. There is no rebound and no guarding.  Musculoskeletal: Normal range of motion. She exhibits severe edema with chronic stasis  dermatitis and no tenderness.  Neurological: She is alert and oriented to person, place, and time.  Coordination normal.  Skin: Skin is warm and dry. No rash noted. She is not diaphoretic. No erythema. No pallor.  Psychiatric: She has a normal mood and affect. Her behavior is normal. Judgment and thought content normal.      ASSESSMENT AND PLAN

## 2011-09-13 NOTE — Patient Instructions (Signed)
Your condition seems to be stable. Continue same medications.  Follow up in 4 months.

## 2011-09-13 NOTE — Assessment & Plan Note (Signed)
Her blood pressure is currently elevated but her home blood pressure readings seems to be within acceptable range. No changes will be made today.

## 2011-09-13 NOTE — Assessment & Plan Note (Signed)
The patient weight is back to her baseline after the dose of furosemide was increased to 20 mg once daily. She had followup labs which showed stable renal function and electrolytes. I recommend continuing current dose.

## 2011-09-16 ENCOUNTER — Encounter: Payer: Self-pay | Admitting: Internal Medicine

## 2011-09-16 ENCOUNTER — Ambulatory Visit (INDEPENDENT_AMBULATORY_CARE_PROVIDER_SITE_OTHER): Payer: Medicare Other | Admitting: Internal Medicine

## 2011-09-16 VITALS — BP 149/80 | HR 68 | Temp 98.4°F | Ht 63.0 in | Wt 275.0 lb

## 2011-09-16 DIAGNOSIS — I10 Essential (primary) hypertension: Secondary | ICD-10-CM

## 2011-09-16 DIAGNOSIS — M5030 Other cervical disc degeneration, unspecified cervical region: Secondary | ICD-10-CM

## 2011-09-16 DIAGNOSIS — I509 Heart failure, unspecified: Secondary | ICD-10-CM

## 2011-09-16 DIAGNOSIS — I5032 Chronic diastolic (congestive) heart failure: Secondary | ICD-10-CM

## 2011-09-16 NOTE — Assessment & Plan Note (Signed)
Gets along okay on the fentanyl

## 2011-09-16 NOTE — Assessment & Plan Note (Signed)
NYHA class 3 symptoms Stable status now

## 2011-09-16 NOTE — Assessment & Plan Note (Signed)
Doing okay without meds Uses OTC enzyme supplement

## 2011-09-16 NOTE — Progress Notes (Signed)
Subjective:    Patient ID: Kayla [name], female    DOB: 1923/09/08, 78 y.o.   MRN: [id number]  HPI Here with daughter as usual  Doing well Just saw Dr [place] Lions him  Checks BP at home regularly Usually 112/70-140/85 at home Only cervical headaches that are related to movement  Chronic pain is mostly related to cervical disc disease and past surgery Pain control is fair on just fentanyl Only tylenol inbetween---no oxycodone for several months Has long standing fibromyalgia as well Uses cervical collar that vibrates also Sleeps sitting up for her neck  Breathing has been stable Stable DOE No chest pain No palpitations but occ feels "like a little worm" in left lower parasternal area Ongoing edema which is stable  Just uses OTC "nuzyme" for her GERD Occ belches and that feels like pressure relieves  Current Outpatient Prescriptions on File Prior to Visit  Medication Sig Dispense Refill  . aliskiren (TEKTURNA) 300 MG tablet Take 1 tablet (300 mg total) by mouth daily.  10 tablet  0  . aspirin 81 MG tablet Take 81 mg by mouth daily.        . carvedilol (COREG) 25 MG tablet Take 1 tablet (25 mg total) by mouth 2 (two) times daily with a meal.  180 tablet  3  . Casanthranol-Docusate Sodium (LAXATIVE PLUS STOOL SOFTENER) 30-100 MG CAPS Take 1 capsule by mouth 4 (four) times daily as needed.        . ciprofloxacin (CIPRO) 250 MG tablet 1 tab by mouth 3-4 days  As needed      . fentaNYL (DURAGESIC - DOSED MCG/HR) 12 MCG/HR Place 1 patch (12.5 mcg total) onto the skin every 3 (three) days. along with the patch  10 patch  0  . fentaNYL (DURAGESIC - DOSED MCG/HR) 25 MCG/HR Place 1 patch (25 mcg total) onto the skin every 3 (three) days.  10 patch  0  . hydrALAZINE (APRESOLINE) 50 MG tablet Take 25 mg by mouth 3 (three) times daily.      . Liotrix (THYROLAR-1/4) 15 (3.1-12.5) MG (MCG) TABS Take 1 tablet by mouth daily.  30 each  11  . lisinopril (PRINIVIL,ZESTRIL) 40 MG tablet  Take 40 mg by mouth daily.        . Lutein-Zeaxanthin 6-1 MG TABS Take 1 tablet by mouth daily.        . Magnesium 400 MG CAPS Take 400 mg by mouth daily.      . Melatonin 3 MG CAPS Take 1 capsule by mouth daily.        . phenazopyridine (PYRIDIUM) 100 MG tablet Take 1 tablet (100 mg total) by mouth 3 (three) times daily as needed.  30 tablet  5  . potassium chloride SA (K-DUR,KLOR-CON) 20 MEQ tablet Take 1 tablet (20 mEq total) by mouth 2 (two) times daily.  180 tablet  3  . Probiotic Product (ACIDOPHILUS PROBIOTIC BLEND) CAPS Take 1 capsule by mouth daily.        Marland Kitchen torsemide (DEMADEX) 20 MG tablet Take 1 tablet (20 mg total) by mouth daily.  90 tablet  3  . Valerian Root 450 MG CAPS Take 1 capsule by mouth daily.        . Vitamin Mixture (CHROMIUM PICOLINATE PO) Take by mouth as directed.        Marland Kitchen DISCONTD: cromolyn (NASALCROM) 5.2 MG/ACT nasal spray 2 sprays by Nasal route daily.  180 Act  3    Allergies  Allergen Reactions  .  Amoxicillin-Pot Clavulanate   . Cefuroxime Axetil   . Cephalexin     Felt uncomfortable  . Sulfonamide Derivatives     unknown    Past Medical History  Diagnosis Date  . HTN (hypertension)     difficult to control  . Hypothyroidism   . OP (osteoporosis)   . Fibromyalgia   . Edema of both legs     chronic  . Degenerative disk disease     cervical  . Follicular lymphoma     Quiescent. Dr. Sunday Corn; Dr. Elenore Rota  . Meningioma     Left frontal  . GERD (gastroesophageal reflux disease)   . Obesity   . Cerebral hemorrhage 2004    hypertensive  . Hx: UTI (urinary tract infection)   . Renal artery aneurysm     with stenosis-right; followed by Dr. Gilda Crease  . Diastolic CHF, chronic     Echo (9-11) with EF 65-70%, mild LVH, mid AI, Mild MR, PA systolic pressure >    Past Surgical History  Procedure Date  . Cataract extraction 10/05 and 4/07    Left then Right [facility])  . Abdominal hysterectomy 1958  . Cerebral hemorrhage 10/2002  .  Cervical fusion 1985  . Rectocele repair 1982  . Cystocele repair 1982    Family History  Problem Relation Age of Onset  . Stroke Father   . Cancer Mother     Myeloma  . Hypertension      family    History   Social History  . Marital Status: Married    Spouse Name: N/A    Number of Children: N/A  . Years of Education: N/A   Occupational History  . Retired- Scientist, clinical (histocompatibility and immunogenetics) and taught    Social History Main Topics  . Smoking status: Never Smoker   . Smokeless tobacco: Never Used   Comment: Quit smoking 50 years ago  . Alcohol Use: No  . Drug Use: Not on file  . Sexually Active: Not on file   Other Topics Concern  . Not on file   Social History Narrative  . No narrative on file   Review of Systems Ongoing bladder problems--has dropped and can actually impede bowel movements Appetite is so-so---gets sense of fullness due to central obesity Weight down with increased diuretic of late    Objective:   Physical Exam  Constitutional: She appears well-developed. No distress.  Neck: Normal range of motion. No thyromegaly present.  Cardiovascular: Normal rate.   Murmur heard.      Soft systolic murmur along left sternal border  Pulmonary/Chest: Effort normal and breath sounds normal. No respiratory distress. She has no wheezes. She has no rales.  Musculoskeletal: She exhibits edema.       Large firm calf swelling without pitting  Lymphadenopathy:    She has no cervical adenopathy.  Psychiatric: She has a normal mood and affect. Her behavior is normal.          Assessment & Plan:

## 2011-09-16 NOTE — Assessment & Plan Note (Signed)
BP Readings from Last 3 Encounters:  09/16/11 149/80  09/13/11 169/74  07/12/11 148/87   Reasonable control Systolic 112 at home at times---will not increase meds

## 2011-09-17 ENCOUNTER — Other Ambulatory Visit: Payer: Self-pay | Admitting: Cardiovascular Disease

## 2011-09-17 MED ORDER — LISINOPRIL 40 MG PO TABS: 40.0000 mg | ORAL_TABLET | Freq: Every day | ORAL | Status: DC

## 2011-09-17 NOTE — Telephone Encounter (Signed)
Refilled Lisinopril. 

## 2011-09-17 NOTE — Telephone Encounter (Signed)
90 day please

## 2011-09-20 ENCOUNTER — Telehealth: Payer: Self-pay

## 2011-09-20 NOTE — Telephone Encounter (Signed)
Patient is calling concerned if should be on lisinopril and tekturna? The patient has been taken taken both.

## 2011-09-23 ENCOUNTER — Telehealth: Payer: Self-pay

## 2011-09-23 NOTE — Telephone Encounter (Signed)
Patient aware to take the lisinopril & tekturna.

## 2011-09-23 NOTE — Telephone Encounter (Signed)
She needs both in order to control her hypertension.

## 2011-09-23 NOTE — Telephone Encounter (Signed)
The patient would like to know if needs to ben on both the Tekturna & lisinopril. Please advise what to do?

## 2011-10-09 ENCOUNTER — Other Ambulatory Visit: Payer: Self-pay

## 2011-10-09 ENCOUNTER — Telehealth: Payer: Self-pay

## 2011-10-09 MED ORDER — FENTANYL 12 MCG/HR TD PT72: 1.0000 | MEDICATED_PATCH | TRANSDERMAL | Status: DC

## 2011-10-09 MED ORDER — FENTANYL 25 MCG/HR TD PT72: 1.0000 | MEDICATED_PATCH | TRANSDERMAL | Status: DC

## 2011-10-09 NOTE — Telephone Encounter (Signed)
Pts daughter, [name] concerned pt has hx of neck pain. Last 2-3 days neck pain has worsened. Pain level now 8-10. Fentanyl patches not helping. Is one hour away from our office; does Dr Alphonsus Sias want to see pt here or do referral to specialist. [name] wants to know if Dr Alphonsus Sias is familiar with Dr Thomes Cake.(does not know where he is located; heard about him from family member). [facility] [place].

## 2011-10-09 NOTE — Telephone Encounter (Signed)
Spoke with patient's daughter and advised rx ready for pick-up and it will be at the front desk.  

## 2011-10-09 NOTE — Telephone Encounter (Signed)
[name] pts daughter request rx for Fentanyl 12 mcg/HR and 25 mcg/HR. Call when ready for pick up.

## 2011-10-10 NOTE — Telephone Encounter (Signed)
I am okay with ortho referral but I only know Kayla [name] and [facility] If they know an orthopedist closer to their home, they will often set up appts without referral from primary care doctor

## 2011-10-10 NOTE — Telephone Encounter (Signed)
I don't know Dr Thomes Cake  She used to have a prn pain med but hadn't needed it for a while Okay to phone in order for hydrocodone/APAP 10/325 1-2 tid prn severe pain #40 x 0 If not better, I can see tomorrow

## 2011-10-10 NOTE — Telephone Encounter (Signed)
Spoke with patient and daughter and pt declined appt and medication, pt states she been on this type medication before and didn't like it. Daughter and son would like to know if pt could be having mini strokes, daughter thought about a muscle relaxer but didn't know how it would work with pt's incontinence. They would like a ortho referral if possible or any other referral. Pt had bone density before and has osteoporosis and osteoarthritis, daughter asks how does pt's neck and back look and her vertebrae for previous views? Daughter is very confused because pt is reluctant to try new things. Please advise

## 2011-10-11 NOTE — Telephone Encounter (Signed)
Left message for daughter on her VM, asked her to call back if they would like the ortho referral and where they would like it.

## 2011-10-14 NOTE — Telephone Encounter (Signed)
Daughter called back and pt states she feels much better, she had prayer and the pain went away, per daughter they will call if things change.

## 2011-10-14 NOTE — Telephone Encounter (Signed)
.  left message to have patient's daughter return my call.  

## 2011-10-14 NOTE — Telephone Encounter (Signed)
Sounds good

## 2011-10-25 ENCOUNTER — Other Ambulatory Visit: Payer: Self-pay | Admitting: *Deleted

## 2011-10-25 MED ORDER — LIOTRIX (T3-T4) 15 (3.1-12.5) MG (MCG) PO TABS: 1.0000 | ORAL_TABLET | Freq: Every day | ORAL | Status: DC

## 2011-11-05 ENCOUNTER — Other Ambulatory Visit: Payer: Self-pay | Admitting: Cardiovascular Disease

## 2011-11-05 MED ORDER — ALISKIREN FUMARATE 300 MG PO TABS: 300.0000 mg | ORAL_TABLET | Freq: Every day | ORAL | Status: DC

## 2011-11-05 MED ORDER — CARVEDILOL 25 MG PO TABS: 25.0000 mg | ORAL_TABLET | Freq: Two times a day (BID) | ORAL | Status: DC

## 2011-11-05 NOTE — Telephone Encounter (Signed)
Refilled Coreg and Tekturna.

## 2011-11-13 ENCOUNTER — Other Ambulatory Visit: Payer: Self-pay | Admitting: *Deleted

## 2011-11-13 MED ORDER — FENTANYL 25 MCG/HR TD PT72: 1.0000 | MEDICATED_PATCH | TRANSDERMAL | Status: DC

## 2011-11-13 MED ORDER — FENTANYL 12 MCG/HR TD PT72: 1.0000 | MEDICATED_PATCH | TRANSDERMAL | Status: DC

## 2011-11-13 NOTE — Telephone Encounter (Signed)
Spoke with patient and advised rx ready for pick-up and it will be at the front desk.  

## 2011-12-10 ENCOUNTER — Other Ambulatory Visit: Payer: Self-pay

## 2011-12-10 MED ORDER — FENTANYL 12 MCG/HR TD PT72: 1.0000 | MEDICATED_PATCH | TRANSDERMAL | Status: DC

## 2011-12-10 MED ORDER — FENTANYL 25 MCG/HR TD PT72: 1.0000 | MEDICATED_PATCH | TRANSDERMAL | Status: DC

## 2011-12-10 NOTE — Telephone Encounter (Signed)
Spoke with patient's daughter and advised rx ready for pick-up and it will be at the front desk.  

## 2011-12-10 NOTE — Telephone Encounter (Signed)
pts daughter request rx for fentanyl patch 12 mcg and 25 mcg.call when ready for pick up.

## 2012-01-07 ENCOUNTER — Other Ambulatory Visit: Payer: Self-pay | Admitting: Cardiovascular Disease

## 2012-01-07 MED ORDER — POTASSIUM CHLORIDE CRYS ER 20 MEQ PO TBCR: 20.0000 meq | EXTENDED_RELEASE_TABLET | Freq: Two times a day (BID) | ORAL | Status: DC

## 2012-01-07 MED ORDER — TORSEMIDE 20 MG PO TABS: 20.0000 mg | ORAL_TABLET | Freq: Every day | ORAL | Status: DC

## 2012-01-07 NOTE — Telephone Encounter (Signed)
Refilled Torsemide and potassium chloride.

## 2012-01-07 NOTE — Telephone Encounter (Signed)
Needs 90 day script PLEASE

## 2012-01-10 ENCOUNTER — Ambulatory Visit (INDEPENDENT_AMBULATORY_CARE_PROVIDER_SITE_OTHER): Payer: Medicare Other | Admitting: Internal Medicine

## 2012-01-10 ENCOUNTER — Encounter: Payer: Self-pay | Admitting: Internal Medicine

## 2012-01-10 VITALS — BP 168/100 | HR 80 | Temp 98.6°F | Wt 276.0 lb

## 2012-01-10 DIAGNOSIS — I10 Essential (primary) hypertension: Secondary | ICD-10-CM

## 2012-01-10 DIAGNOSIS — M5030 Other cervical disc degeneration, unspecified cervical region: Secondary | ICD-10-CM

## 2012-01-10 DIAGNOSIS — I5032 Chronic diastolic (congestive) heart failure: Secondary | ICD-10-CM

## 2012-01-10 LAB — BASIC METABOLIC PANEL
BUN: 20 mg/dL (ref 6–23)
CO2: 30 mEq/L (ref 19–32)
Chloride: 102 mEq/L (ref 96–112)
Creatinine, Ser: 0.8 mg/dL (ref 0.4–1.2)

## 2012-01-10 LAB — CBC WITH DIFFERENTIAL/PLATELET
Basophils Relative: 0.3 % (ref 0.0–3.0)
Eosinophils Absolute: 0.1 10*3/uL (ref 0.0–0.7)
MCHC: 32.2 g/dL (ref 30.0–36.0)
MCV: 90.3 fl (ref 78.0–100.0)
Monocytes Absolute: 0.5 10*3/uL (ref 0.1–1.0)
Neutrophils Relative %: 75.5 % (ref 43.0–77.0)
Platelets: 227 10*3/uL (ref 150.0–400.0)
RBC: 4.34 Mil/uL (ref 3.87–5.11)

## 2012-01-10 LAB — HEPATIC FUNCTION PANEL
ALT: 15 U/L (ref 0–35)
Albumin: 4.3 g/dL (ref 3.5–5.2)
Total Bilirubin: 0.6 mg/dL (ref 0.3–1.2)

## 2012-01-10 LAB — LIPID PANEL
Cholesterol: 180 mg/dL (ref 0–200)
Triglycerides: 107 mg/dL (ref 0.0–149.0)

## 2012-01-10 NOTE — Assessment & Plan Note (Signed)
Will recheck levels

## 2012-01-10 NOTE — Assessment & Plan Note (Signed)
BP Readings from Last 3 Encounters:  01/10/12 168/100  09/16/11 149/80  09/13/11 169/74   Clearly goes up with the anxiety of her visits Generally 20 mmHg lower at home No changes for now

## 2012-01-10 NOTE — Assessment & Plan Note (Signed)
Stable NYHA class 2-3 symptoms Neutral fluid status No changes

## 2012-01-10 NOTE — Assessment & Plan Note (Signed)
Satisfied with pain control No changes needed

## 2012-01-10 NOTE — Progress Notes (Signed)
Subjective:    Patient ID: ANAYELLI LAI, female    DOB: 1924/04/01, 78 y.o.   MRN: [id number]  HPI Here with daughter  Feels better Ongoing neck pain--uses the patches and tylenol Satisfied with pain control No problems with stomach with the fentanyl like she had with the oxycodone  No chest pain Still gets DOE if she pushes it May have to rest briefly after personal care---like bathroom, wiping, pads, etc Does walk fully in house Helps with laundry Occ does some ROM exercises Chronic stable edema---or slightly better  Mind still sharp Keeps her faith strong  Current Outpatient Prescriptions on File Prior to Visit  Medication Sig Dispense Refill  . aliskiren (TEKTURNA) 300 MG tablet Take 1 tablet (300 mg total) by mouth daily.  30 tablet  3  . aspirin 81 MG tablet Take 81 mg by mouth daily.        . carvedilol (COREG) 25 MG tablet Take 1 tablet (25 mg total) by mouth 2 (two) times daily with a meal.  180 tablet  3  . Casanthranol-Docusate Sodium (LAXATIVE PLUS STOOL SOFTENER) 30-100 MG CAPS Take 1 capsule by mouth 4 (four) times daily as needed.        . ciprofloxacin (CIPRO) 250 MG tablet 1 tab by mouth 3-4 days  As needed      . cromolyn (NASALCROM) 5.2 MG/ACT nasal spray Place 2 sprays into the nose daily.      . fentaNYL (DURAGESIC - DOSED MCG/HR) 12 MCG/HR Place 1 patch (12.5 mcg total) onto the skin every 3 (three) days. along with the patch  10 patch  0  . fentaNYL (DURAGESIC - DOSED MCG/HR) 25 MCG/HR Place 1 patch (25 mcg total) onto the skin every 3 (three) days.  10 patch  0  . hydrALAZINE (APRESOLINE) 50 MG tablet Take 25 mg by mouth 3 (three) times daily.      . Liotrix (THYROLAR-1/4) 15 (3.1-12.5) MG (MCG) TABS Take 1 tablet by mouth daily.  30 each  11  . lisinopril (PRINIVIL,ZESTRIL) 40 MG tablet Take 1 tablet (40 mg total) by mouth daily.  90 tablet  3  . Lutein-Zeaxanthin 6-1 MG TABS Take 1 tablet by mouth daily.        . Magnesium 400 MG CAPS Take 400 mg  by mouth daily.      . Melatonin 3 MG CAPS Take 1 capsule by mouth daily.        . NONFORMULARY OR COMPOUNDED ITEM SOVEREIGN NANO SILVER LIQUID      . phenazopyridine (PYRIDIUM) 100 MG tablet Take 1 tablet (100 mg total) by mouth 3 (three) times daily as needed.  30 tablet  5  . potassium chloride SA (K-DUR,KLOR-CON) 20 MEQ tablet Take 1 tablet (20 mEq total) by mouth 2 (two) times daily.  180 tablet  3  . Probiotic Product (ACIDOPHILUS PROBIOTIC BLEND) CAPS Take 1 capsule by mouth daily.        Marland Kitchen torsemide (DEMADEX) 20 MG tablet Take 1 tablet (20 mg total) by mouth daily.  90 tablet  3  . Valerian Root 450 MG CAPS Take 1 capsule by mouth daily.        . Vitamin Mixture (CHROMIUM PICOLINATE PO) Take by mouth as directed.          Allergies  Allergen Reactions  . Amoxicillin-Pot Clavulanate   . Cefuroxime Axetil   . Cephalexin     Felt uncomfortable  . Sulfonamide Derivatives  unknown    Past Medical History  Diagnosis Date  . HTN (hypertension)     difficult to control  . Hypothyroidism   . OP (osteoporosis)   . Fibromyalgia   . Edema of both legs     chronic  . Degenerative disk disease     cervical  . Follicular lymphoma     Quiescent. Dr. Sunday Corn; Dr. Elenore Rota  . Meningioma     Left frontal  . GERD (gastroesophageal reflux disease)   . Obesity   . Cerebral hemorrhage 2004    hypertensive  . Hx: UTI (urinary tract infection)   . Renal artery aneurysm     with stenosis-right; followed by Dr. Gilda Crease  . Diastolic CHF, chronic     Echo (9-11) with EF 65-70%, mild LVH, mid AI, Mild MR, PA systolic pressure >    Past Surgical History  Procedure Date  . Cataract extraction 10/05 and 4/07    Left then Right [facility])  . Abdominal hysterectomy 1958  . Cerebral hemorrhage 10/2002  . Cervical fusion 1985  . Rectocele repair 1982  . Cystocele repair 1982    Family History  Problem Relation Age of Onset  . Stroke Father   . Cancer Mother     Myeloma  .  Hypertension      family    History   Social History  . Marital Status: Married    Spouse Name: N/A    Number of Children: N/A  . Years of Education: N/A   Occupational History  . Retired- Scientist, clinical (histocompatibility and immunogenetics) and taught    Social History Main Topics  . Smoking status: Never Smoker   . Smokeless tobacco: Never Used   Comment: Quit smoking 50 years ago  . Alcohol Use: No  . Drug Use: Not on file  . Sexually Active: Not on file   Other Topics Concern  . Not on file   Social History Narrative  . No narrative on file      Review of Systems Sleeps in recliner---4 hours and 2 separate 2 hour stretches. No PND but does have nocturia Appetite isn't great---eats "what is available". Family brings food Grape seed oil helps keep her bowels regular    Objective:   Physical Exam  Constitutional: She appears well-developed and well-nourished. No distress.  Neck: Normal range of motion. Neck supple.  Cardiovascular: Normal rate, regular rhythm and normal heart sounds.  Exam reveals no gallop.   No murmur heard. Pulmonary/Chest: Effort normal and breath sounds normal. No respiratory distress. She has no wheezes. She has no rales.  Abdominal: Soft. There is no tenderness.  Musculoskeletal: She exhibits edema.       4+ non pitting edema which is stable  Lymphadenopathy:    She has no cervical adenopathy.  Psychiatric: She has a normal mood and affect. Her behavior is normal.          Assessment & Plan:

## 2012-01-13 ENCOUNTER — Encounter: Payer: Self-pay | Admitting: *Deleted

## 2012-01-14 ENCOUNTER — Other Ambulatory Visit: Payer: Self-pay

## 2012-01-14 MED ORDER — FENTANYL 12 MCG/HR TD PT72: 1.0000 | MEDICATED_PATCH | TRANSDERMAL | Status: DC

## 2012-01-14 MED ORDER — FENTANYL 25 MCG/HR TD PT72: 1.0000 | MEDICATED_PATCH | TRANSDERMAL | Status: DC

## 2012-01-14 NOTE — Telephone Encounter (Signed)
Kayla [name] pts daughter request Fentanyl 25 mcg and 12 mcg rxs.call when ready for pick up.

## 2012-01-14 NOTE — Telephone Encounter (Signed)
Spoke with patient and advised rx ready for pick-up and it will be at the front desk.  

## 2012-01-16 ENCOUNTER — Encounter: Payer: Self-pay | Admitting: Cardiovascular Disease

## 2012-01-16 ENCOUNTER — Ambulatory Visit (INDEPENDENT_AMBULATORY_CARE_PROVIDER_SITE_OTHER): Payer: Medicare Other | Admitting: Cardiovascular Disease

## 2012-01-16 VITALS — BP 166/83 | HR 80 | Ht 62.0 in | Wt 277.5 lb

## 2012-01-16 DIAGNOSIS — I5032 Chronic diastolic (congestive) heart failure: Secondary | ICD-10-CM

## 2012-01-16 DIAGNOSIS — I10 Essential (primary) hypertension: Secondary | ICD-10-CM

## 2012-01-16 NOTE — Assessment & Plan Note (Signed)
She appears to be euvolemic.Marland Kitchen Continue current medications.

## 2012-01-16 NOTE — Progress Notes (Signed)
HPI  This is an 78 year old female who is here today for a followup visit. She has a history of HTN, morbid obesity, chronic diastolic heart failure and chronic lower extremity edema/lymphedema. She has had leg swelling bilaterally for > 10 years. She has been seen by vascular surgery in [place] and is thought to have a component of venous insufficiency and, I suspect, lymphedema. She cannot wear compression stockings (unable to put them on). She has been short of breath after walking about 30 feet chronically. She sleeps with 3 pillows at night, mainly for GERD rather than orthopnea. No chest pain/tightness. SBP at home has been running in the 120s-150 according to her home blood pressure readings.  Most recent echocardiogram was in 2011 which showed normal ejection fraction. She has been doing reasonably well. She denies any change in her symptoms.   Allergies  Allergen Reactions  . Amoxicillin-Pot Clavulanate   . Cefuroxime Axetil   . Cephalexin     Felt uncomfortable  . Sulfonamide Derivatives     unknown     Current Outpatient Prescriptions on File Prior to Visit  Medication Sig Dispense Refill  . aliskiren (TEKTURNA) 300 MG tablet Take 1 tablet (300 mg total) by mouth daily.  30 tablet  3  . aspirin 81 MG tablet Take 81 mg by mouth daily.        . carvedilol (COREG) 25 MG tablet Take 1 tablet (25 mg total) by mouth 2 (two) times daily with a meal.  180 tablet  3  . Casanthranol-Docusate Sodium (LAXATIVE PLUS STOOL SOFTENER) 30-100 MG CAPS Take 1 capsule by mouth 4 (four) times daily as needed.        . ciprofloxacin (CIPRO) 250 MG tablet 1 tab by mouth 3-4 days  As needed      . cromolyn (NASALCROM) 5.2 MG/ACT nasal spray Place 2 sprays into the nose daily.      . fentaNYL (DURAGESIC - DOSED MCG/HR) 12 MCG/HR Place 1 patch (12.5 mcg total) onto the skin every 3 (three) days. along with the patch  10 patch  0  . fentaNYL (DURAGESIC - DOSED MCG/HR) 25 MCG/HR Place 1 patch  (25 mcg total) onto the skin every 3 (three) days.  10 patch  0  . hydrALAZINE (APRESOLINE) 50 MG tablet Take 25 mg by mouth 3 (three) times daily.      . Liotrix (THYROLAR-1/4) 15 (3.1-12.5) MG (MCG) TABS Take 1 tablet by mouth daily.  30 each  11  . lisinopril (PRINIVIL,ZESTRIL) 40 MG tablet Take 1 tablet (40 mg total) by mouth daily.  90 tablet  3  . Lutein-Zeaxanthin 6-1 MG TABS Take 1 tablet by mouth daily.        . Magnesium 400 MG CAPS Take 400 mg by mouth daily.      . Melatonin 3 MG CAPS Take 1 capsule by mouth daily.        . NONFORMULARY OR COMPOUNDED ITEM SOVEREIGN NANO SILVER LIQUID      . phenazopyridine (PYRIDIUM) 100 MG tablet Take 1 tablet (100 mg total) by mouth 3 (three) times daily as needed.  30 tablet  5  . potassium chloride SA (K-DUR,KLOR-CON) 20 MEQ tablet Take 1 tablet (20 mEq total) by mouth 2 (two) times daily.  180 tablet  3  . Probiotic Product (ACIDOPHILUS PROBIOTIC BLEND) CAPS Take 1 capsule by mouth daily.        Marland Kitchen torsemide (DEMADEX) 20 MG tablet Take 1 tablet (20  mg total) by mouth daily.  90 tablet  3  . Valerian Root 450 MG CAPS Take 1 capsule by mouth daily.        . Vitamin Mixture (CHROMIUM PICOLINATE PO) Take by mouth as directed.           Past Medical History  Diagnosis Date  . HTN (hypertension)     difficult to control  . Hypothyroidism   . OP (osteoporosis)   . Fibromyalgia   . Edema of both legs     chronic  . Degenerative disk disease     cervical  . Follicular lymphoma     Quiescent. Dr. Sunday Corn; Dr. Elenore Rota  . Meningioma     Left frontal  . GERD (gastroesophageal reflux disease)   . Obesity   . Cerebral hemorrhage 2004    hypertensive  . Hx: UTI (urinary tract infection)   . Renal artery aneurysm     with stenosis-right; followed by Dr. Gilda Crease  . Diastolic CHF, chronic     Echo (9-11) with EF 65-70%, mild LVH, mid AI, Mild MR, PA systolic pressure >     Past Surgical History  Procedure Date  . Cataract extraction  10/05 and 4/07    Left then Right [facility])  . Abdominal hysterectomy 1958  . Cerebral hemorrhage 10/2002  . Cervical fusion 1985  . Rectocele repair 1982  . Cystocele repair 1982     Family History  Problem Relation Age of Onset  . Stroke Father   . Cancer Mother     Myeloma  . Hypertension      family     History   Social History  . Marital Status: Married    Spouse Name: N/A    Number of Children: N/A  . Years of Education: N/A   Occupational History  . Retired- Scientist, clinical (histocompatibility and immunogenetics) and taught    Social History Main Topics  . Smoking status: Never Smoker   . Smokeless tobacco: Never Used   Comment: Quit smoking 50 years ago  . Alcohol Use: No  . Drug Use: Not on file  . Sexually Active: Not on file   Other Topics Concern  . Not on file   Social History Narrative  . No narrative on file     PHYSICAL EXAM   BP 166/83  Pulse 80  Ht 5\' 2"  (1.575 m)  Wt 277 lb 8 oz (125.873 kg)  BMI 50.76 kg/m2  Constitutional: She is oriented to person, place, and time. She appears well-developed and well-nourished. No distress.  HENT: No nasal discharge.  Head: Normocephalic and atraumatic.  Eyes: Pupils are equal and round. Right eye exhibits no discharge. Left eye exhibits no discharge.  Neck: Normal range of motion. Neck supple. No JVD present. No thyromegaly present.  Cardiovascular: Normal rate, regular rhythm, normal heart sounds. Exam reveals no gallop and no friction rub. 2/6 systolic murmur at the base.  Pulmonary/Chest: Effort normal and breath sounds normal. No stridor. No respiratory distress. She has no wheezes. She has no rales. She exhibits no tenderness.  Abdominal: Soft. Bowel sounds are normal. She exhibits no distension. There is no tenderness. There is no rebound and no guarding.  Musculoskeletal: Normal range of motion. She exhibits chronic edema and no tenderness.  Neurological: She is alert and oriented to person, place, and time. Coordination normal.    Skin: Skin is warm and dry. No rash noted. She is not diaphoretic. No erythema. No pallor.  Psychiatric: She has a  normal mood and affect. Her behavior is normal. Judgment and thought content normal.    EKG: Possible ectopic Atrial  Rhythm  -First degree A-V block  P:QRS - 1:1, Abnormal P axis, H Rate 80  PRi = 258 -Inferior infarct -age undetermined  -Poor R-wave progression -nonspecific -consider old anterior infarct.    ASSESSMENT AND PLAN

## 2012-01-16 NOTE — Assessment & Plan Note (Signed)
Her blood pressure is elevated but home blood pressure readings are within acceptable range. She likely had a component of white coat syndrome.

## 2012-01-16 NOTE — Patient Instructions (Addendum)
Continue same medications.  Follow up in 6 months.  

## 2012-02-12 ENCOUNTER — Other Ambulatory Visit: Payer: Self-pay

## 2012-02-12 MED ORDER — FENTANYL 12 MCG/HR TD PT72: 1.0000 | MEDICATED_PATCH | TRANSDERMAL | Status: DC

## 2012-02-12 MED ORDER — FENTANYL 25 MCG/HR TD PT72: 1.0000 | MEDICATED_PATCH | TRANSDERMAL | Status: DC

## 2012-02-12 NOTE — Telephone Encounter (Signed)
pts daughter [name] request rx for fentanyl patch 12 mcg and 25 mcg. Call when ready for pick up.

## 2012-02-12 NOTE — Telephone Encounter (Signed)
Left message on machine that rx is ready for pick-up, and it will be at our front desk.  

## 2012-03-20 ENCOUNTER — Other Ambulatory Visit: Payer: Self-pay

## 2012-03-20 MED ORDER — FENTANYL 12 MCG/HR TD PT72: 1.0000 | MEDICATED_PATCH | TRANSDERMAL | Status: DC

## 2012-03-20 MED ORDER — FENTANYL 25 MCG/HR TD PT72: 1.0000 | MEDICATED_PATCH | TRANSDERMAL | Status: DC

## 2012-03-20 NOTE — Telephone Encounter (Signed)
Left message on machine that rx is ready for pick-up, and it will be at our front desk.  

## 2012-03-20 NOTE — Telephone Encounter (Signed)
Kayla [name] pts daughter left v/m requesting Fentanyl patch 12 mcg and 25 mcg.call when ready for pick up.

## 2012-04-06 ENCOUNTER — Other Ambulatory Visit: Payer: Self-pay

## 2012-04-06 MED ORDER — ALISKIREN FUMARATE 300 MG PO TABS: 300.0000 mg | ORAL_TABLET | Freq: Every day | ORAL | Status: DC

## 2012-04-06 NOTE — Telephone Encounter (Signed)
Refill sent for Tekturna 300 mg take one tablet daily.

## 2012-04-16 ENCOUNTER — Other Ambulatory Visit: Payer: Self-pay

## 2012-04-16 MED ORDER — FENTANYL 25 MCG/HR TD PT72: 1.0000 | MEDICATED_PATCH | TRANSDERMAL | Status: DC

## 2012-04-16 MED ORDER — FENTANYL 12 MCG/HR TD PT72: 1.0000 | MEDICATED_PATCH | TRANSDERMAL | Status: DC

## 2012-04-16 NOTE — Telephone Encounter (Signed)
[name] pts daughter left v/m requesting rx Fentanyl 12 mcg and 25 mcg. Call when ready for pick up.

## 2012-04-16 NOTE — Telephone Encounter (Signed)
Spoke with patient and advised rx ready for pick-up and it will be at the front desk.  

## 2012-05-13 ENCOUNTER — Telehealth: Payer: Self-pay | Admitting: Internal Medicine

## 2012-05-13 NOTE — Telephone Encounter (Signed)
Patient Information:  Caller Name: [name]  Phone: [phone]  Patient: Kayla [name], Kayla [name]  Gender: Female  DOB: 1923/12/05  Age: 79 Years  PCP: [facility])  Office Follow Up:  Does the office need to follow up with this patient?: Yes  Instructions For The Office: OFFICE PLEASE FOLLOW UP WITH PT IF MD WILL CALL IN ANTIBIOTIC.  CALLER DID NOT WANT TO BRING PT INTO THE OFFICE   Symptoms  Reason For Call & Symptoms: caller states pt has had a low grade fever and now has sinus drainage that is yellow.  caller states pt doesnt get out much and doesn move  a lot and pt is worried about secondary infection  Reviewed Health History In EMR: Yes  Reviewed Medications In EMR: Yes  Reviewed Allergies In EMR: Yes  Reviewed Surgeries / Procedures: Yes  Date of Onset of Symptoms: 05/08/2012  Treatments Tried: mucinex, tylenol  Treatments Tried Worked: No  Guideline(s) Used:  Sinus Pain and Congestion  Disposition Per Guideline:   Home Care  Reason For Disposition Reached:   Sinus congestion as part of a cold, present < 10 days  Advice Given:  For a Stuffy Nose - Use Nasal Washes:  Introduction: Saline (salt water) nasal irrigation (nasal wash) is an effective and simple home remedy for treating stuffy nose and sinus congestion. The nose can be irrigated by pouring, spraying, or squirting salt water into the nose and then letting it run back out.  How it Helps: The salt water rinses out excess mucus, washes out any irritants (dust, allergens) that might be present, and moistens the nasal cavity.  Pain and Fever Medicines:  For pain or fever relief, take either acetaminophen or ibuprofen.  Hydration:  Drink plenty of liquids (6-8 glasses of water daily). If the air in your home is dry, use a cool mist humidifier  Call Back If:   You become worse.  Patient Refused Recommendation:  Patient Requests Prescription  Caller is requesting an antibiotic to be called in for  patient.  Caller states she is worried about pt getting a secondary infection due to immobility.

## 2012-05-13 NOTE — Telephone Encounter (Signed)
Okay to send Rx for clindamycin 300mg  tid #30 x 0 If she is not improving by Friday, I really will need to see her

## 2012-05-14 MED ORDER — CLINDAMYCIN HCL 300 MG PO CAPS: 300.0000 mg | ORAL_CAPSULE | Freq: Three times a day (TID) | ORAL | Status: DC

## 2012-05-14 NOTE — Telephone Encounter (Signed)
Spoke with daughter and advised results. rx sent to pharmacy by e-script  

## 2012-05-14 NOTE — Addendum Note (Signed)
Addended by: Sueanne Margarita on: 05/14/2012 11:19 AM   Modules accepted: Orders

## 2012-05-20 ENCOUNTER — Other Ambulatory Visit: Payer: Self-pay

## 2012-05-20 MED ORDER — FENTANYL 12 MCG/HR TD PT72: 1.0000 | MEDICATED_PATCH | TRANSDERMAL | Status: DC

## 2012-05-20 MED ORDER — FENTANYL 25 MCG/HR TD PT72: 1.0000 | MEDICATED_PATCH | TRANSDERMAL | Status: DC

## 2012-05-20 NOTE — Telephone Encounter (Signed)
Left message on machine that rx is ready for pick-up, and it will be at our front desk.  

## 2012-05-20 NOTE — Telephone Encounter (Signed)
[name],pts daughter left v/m requesting rx fentanyl 12 mcg and 25 mcg patch. Call when ready for pick up.

## 2012-06-11 ENCOUNTER — Other Ambulatory Visit: Payer: Self-pay | Admitting: Cardiovascular Disease

## 2012-06-11 NOTE — Telephone Encounter (Signed)
Refilled Hydralazine #90 R#3 sent to [facility].

## 2012-06-15 ENCOUNTER — Other Ambulatory Visit: Payer: Self-pay

## 2012-06-15 NOTE — Telephone Encounter (Signed)
[name], pts daughter request rx for fentanyl 12 and 25 mcg. [name] wants to pick up on 06/17/12. Call when rx ready for pick up.

## 2012-06-16 MED ORDER — FENTANYL 25 MCG/HR TD PT72: 1.0000 | MEDICATED_PATCH | TRANSDERMAL | Status: DC

## 2012-06-16 MED ORDER — FENTANYL 12 MCG/HR TD PT72: 1.0000 | MEDICATED_PATCH | TRANSDERMAL | Status: DC

## 2012-06-16 NOTE — Telephone Encounter (Signed)
Spoke with patient and advised rx ready for pick-up and it will be at the front desk.  

## 2012-07-09 ENCOUNTER — Ambulatory Visit: Payer: Medicare Other | Admitting: Internal Medicine

## 2012-07-13 ENCOUNTER — Other Ambulatory Visit: Payer: Self-pay

## 2012-07-13 MED ORDER — FENTANYL 25 MCG/HR TD PT72: 1.0000 | MEDICATED_PATCH | TRANSDERMAL | Status: DC

## 2012-07-13 MED ORDER — FENTANYL 12 MCG/HR TD PT72: 1.0000 | MEDICATED_PATCH | TRANSDERMAL | Status: DC

## 2012-07-13 NOTE — Telephone Encounter (Signed)
Printed and placed in Kim's box. 

## 2012-07-13 NOTE — Telephone Encounter (Signed)
pts daughter request rx fentanyl patch 12 mcg and 25 mcg. Call when ready for pick up.

## 2012-07-13 NOTE — Telephone Encounter (Signed)
Pt daughter, [name] called and states ins will not pay for 40 mg will have to be 20 mg

## 2012-07-14 MED ORDER — LISINOPRIL 40 MG PO TABS: 40.0000 mg | ORAL_TABLET | Freq: Every day | ORAL | Status: DC

## 2012-07-14 NOTE — Telephone Encounter (Signed)
Patient's daughter notified and Rx's placed up front for pick up.

## 2012-07-29 ENCOUNTER — Ambulatory Visit (INDEPENDENT_AMBULATORY_CARE_PROVIDER_SITE_OTHER): Payer: Medicare Other | Admitting: Internal Medicine

## 2012-07-29 ENCOUNTER — Encounter: Payer: Self-pay | Admitting: Internal Medicine

## 2012-07-29 VITALS — BP 138/80 | HR 73 | Temp 97.9°F | Wt 272.0 lb

## 2012-07-29 DIAGNOSIS — I5032 Chronic diastolic (congestive) heart failure: Secondary | ICD-10-CM

## 2012-07-29 NOTE — Assessment & Plan Note (Signed)
Doing well Stable functional status Stable edema--weight down with care to diet

## 2012-07-29 NOTE — Progress Notes (Signed)
Subjective:    Patient ID: Kayla [name], female    DOB: 02-18-1924, 79 y.o.   MRN: [id number]  HPI Here with daughter, [name] She feels that she is doing well Recent good evaluation with Dr Precious Reel  Satisfied with her pain control On fentanyl and then tylenol in between only Walks with rollator at home, cane/holding on when out Still independent with personal care---has aide to assist  twice a week for shower  Checks BP daily Usually 116-130/65-75 No headaches No chest pain No SOB---limited with exertion still but no worse  Bowels are fine No new weakness  Current Outpatient Prescriptions on File Prior to Visit  Medication Sig Dispense Refill  . aliskiren (TEKTURNA) 300 MG tablet Take 1 tablet (300 mg total) by mouth daily.  30 tablet  12  . aspirin 81 MG tablet Take 81 mg by mouth daily.        . carvedilol (COREG) 25 MG tablet Take 1 tablet (25 mg total) by mouth 2 (two) times daily with a meal.  180 tablet  3  . Casanthranol-Docusate Sodium (LAXATIVE PLUS STOOL SOFTENER) 30-100 MG CAPS Take 1 capsule by mouth 4 (four) times daily as needed.        . ciprofloxacin (CIPRO) 250 MG tablet 1 tab by mouth 3-4 days  As needed      . cromolyn (NASALCROM) 5.2 MG/ACT nasal spray Place 2 sprays into the nose daily.      . fentaNYL (DURAGESIC - DOSED MCG/HR) 12 MCG/HR Place 1 patch (12.5 mcg total) onto the skin every 3 (three) days. along with the patch  10 patch  0  . fentaNYL (DURAGESIC - DOSED MCG/HR) 25 MCG/HR Place 1 patch (25 mcg total) onto the skin every 3 (three) days.  10 patch  0  . hydrALAZINE (APRESOLINE) 50 MG tablet Take 0.5 tablets (25 mg total) by mouth 3 (three) times daily.  90 tablet  3  . Liotrix (THYROLAR-1/4) 15 (3.1-12.5) MG (MCG) TABS Take 1 tablet by mouth daily.  30 each  11  . lisinopril (PRINIVIL,ZESTRIL) 40 MG tablet Take 1 tablet (40 mg total) by mouth daily.  90 tablet  3  . Lutein-Zeaxanthin 6-1 MG TABS Take 1 tablet by mouth daily.        .  Magnesium 400 MG CAPS Take 400 mg by mouth daily.      . Melatonin 3 MG CAPS Take 1 capsule by mouth daily.        . NONFORMULARY OR COMPOUNDED ITEM SOVEREIGN NANO SILVER LIQUID      . phenazopyridine (PYRIDIUM) 100 MG tablet Take 1 tablet (100 mg total) by mouth 3 (three) times daily as needed.  30 tablet  5  . potassium chloride SA (K-DUR,KLOR-CON) 20 MEQ tablet Take 1 tablet (20 mEq total) by mouth 2 (two) times daily.  180 tablet  3  . Probiotic Product (ACIDOPHILUS PROBIOTIC BLEND) CAPS Take 1 capsule by mouth daily.        Marland Kitchen torsemide (DEMADEX) 20 MG tablet Take 1 tablet (20 mg total) by mouth daily.  90 tablet  3  . Valerian Root 450 MG CAPS Take 1 capsule by mouth daily.        . Vitamin Mixture (CHROMIUM PICOLINATE PO) Take by mouth as directed.         No current facility-administered medications on file prior to visit.    Allergies  Allergen Reactions  . Amoxicillin-Pot Clavulanate   . Cefuroxime Axetil   .  Cephalexin     Felt uncomfortable  . Sulfonamide Derivatives     unknown    Past Medical History  Diagnosis Date  . HTN (hypertension)     difficult to control  . Hypothyroidism   . OP (osteoporosis)   . Fibromyalgia   . Edema of both legs     chronic  . Degenerative disk disease     cervical  . Follicular lymphoma     Quiescent. Dr. Sunday Corn; Dr. Elenore Rota  . Meningioma     Left frontal  . GERD (gastroesophageal reflux disease)   . Obesity   . Cerebral hemorrhage 2004    hypertensive  . Hx: UTI (urinary tract infection)   . Renal artery aneurysm     with stenosis-right; followed by Dr. Gilda Crease  . Diastolic CHF, chronic     Echo (9-11) with EF 65-70%, mild LVH, mid AI, Mild MR, PA systolic pressure >    Past Surgical History  Procedure Laterality Date  . Cataract extraction  10/05 and 4/07    Left then Right [facility])  . Abdominal hysterectomy  1958  . Cerebral hemorrhage  10/2002  . Cervical fusion  1985  . Rectocele repair  1982  .  Cystocele repair  1982    Family History  Problem Relation Age of Onset  . Stroke Father   . Cancer Mother     Myeloma  . Hypertension      family    History   Social History  . Marital Status: Married    Spouse Name: N/A    Number of Children: N/A  . Years of Education: N/A   Occupational History  . Retired- Scientist, clinical (histocompatibility and immunogenetics) and taught    Social History Main Topics  . Smoking status: Never Smoker   . Smokeless tobacco: Never Used     Comment: Quit smoking 50 years ago  . Alcohol Use: No  . Drug Use: Not on file  . Sexually Active: Not on file   Other Topics Concern  . Not on file   Social History Narrative  . No narrative on file   Review of Systems Appetite is fine Weight is down 5#--has been trying to eat properly     Objective:   Physical Exam  Constitutional: She appears well-developed and well-nourished. No distress.  Neck: Normal range of motion. Neck supple. No thyromegaly present.  Cardiovascular: Normal rate, regular rhythm and normal heart sounds.  Exam reveals no gallop.   No murmur heard. Pulmonary/Chest: Effort normal and breath sounds normal. No respiratory distress. She has no wheezes. She has no rales.  Abdominal: Soft. There is no tenderness.  Musculoskeletal: She exhibits edema.  Stable non pitting edema  Lymphadenopathy:    She has no cervical adenopathy.  Psychiatric: She has a normal mood and affect. Her behavior is normal.          Assessment & Plan:

## 2012-08-18 ENCOUNTER — Other Ambulatory Visit: Payer: Self-pay

## 2012-08-18 MED ORDER — FENTANYL 25 MCG/HR TD PT72: 1.0000 | MEDICATED_PATCH | TRANSDERMAL | Status: DC

## 2012-08-18 MED ORDER — FENTANYL 12 MCG/HR TD PT72: 1.0000 | MEDICATED_PATCH | TRANSDERMAL | Status: DC

## 2012-08-18 NOTE — Telephone Encounter (Signed)
Left message on machine that rx is ready for pick-up, and it will be at our front desk.  

## 2012-08-18 NOTE — Telephone Encounter (Signed)
Kayla [name] pts daughter left v/m requesting rx fentanyl 12 mcg and 25 mcg. Call when ready for pick up.

## 2012-09-17 ENCOUNTER — Other Ambulatory Visit: Payer: Self-pay

## 2012-09-17 MED ORDER — FENTANYL 25 MCG/HR TD PT72: 1.0000 | MEDICATED_PATCH | TRANSDERMAL | Status: DC

## 2012-09-17 MED ORDER — FENTANYL 12 MCG/HR TD PT72: 1.0000 | MEDICATED_PATCH | TRANSDERMAL | Status: DC

## 2012-09-17 NOTE — Telephone Encounter (Signed)
Left voicemail letting pt's daughter know Rx ready for pick-up

## 2012-09-17 NOTE — Telephone Encounter (Signed)
[name] pts daughter request rx Fentanyl patches 12 mcg and 25 mcg.call when ready for pick up.

## 2012-09-29 ENCOUNTER — Encounter: Payer: Self-pay | Admitting: Radiology

## 2012-09-30 ENCOUNTER — Encounter: Payer: Self-pay | Admitting: Internal Medicine

## 2012-09-30 ENCOUNTER — Ambulatory Visit (INDEPENDENT_AMBULATORY_CARE_PROVIDER_SITE_OTHER): Payer: Medicare Other | Admitting: Internal Medicine

## 2012-09-30 VITALS — BP 158/90 | HR 85 | Temp 97.8°F | Wt 271.0 lb

## 2012-09-30 DIAGNOSIS — R300 Dysuria: Secondary | ICD-10-CM | POA: Insufficient documentation

## 2012-09-30 LAB — POCT URINALYSIS DIPSTICK
Blood, UA: NEGATIVE
Protein, UA: NEGATIVE
Spec Grav, UA: 1.01
Urobilinogen, UA: NEGATIVE

## 2012-09-30 MED ORDER — PHENAZOPYRIDINE HCL 100 MG PO TABS: 100.0000 mg | ORAL_TABLET | Freq: Three times a day (TID) | ORAL | Status: DC | PRN

## 2012-09-30 NOTE — Patient Instructions (Signed)
Please try daily cranberry tablets to prevent urinary infection.

## 2012-09-30 NOTE — Progress Notes (Signed)
Subjective:    Patient ID: Kayla [name], female    DOB: [birth year], 79 y.o.   MRN: [id number]  HPI Here with daughter  Has ongoing urinary problems Gets irritated due to ongoing incontinence and using pads Pyridium helps---uses 2-3 times per week at night  Seemed to be worse 3 days ago Felt warm but no fever (99) Did have burning dysuria---but has this regularly Started cipro 250mg  bid 3-4 days ago Still has the burning  Has tried cranberry juice at times Not clearly helpful  Current Outpatient Prescriptions on File Prior to Visit  Medication Sig Dispense Refill  . aliskiren (TEKTURNA) 300 MG tablet Take 1 tablet (300 mg total) by mouth daily.  30 tablet  12  . aspirin 81 MG tablet Take 81 mg by mouth daily.        . carvedilol (COREG) 25 MG tablet Take 1 tablet (25 mg total) by mouth 2 (two) times daily with a meal.  180 tablet  3  . Casanthranol-Docusate Sodium (LAXATIVE PLUS STOOL SOFTENER) 30-100 MG CAPS Take 1 capsule by mouth 4 (four) times daily as needed.        . cromolyn (NASALCROM) 5.2 MG/ACT nasal spray Place 2 sprays into the nose daily.      . fentaNYL (DURAGESIC - DOSED MCG/HR) 12 MCG/HR Place 1 patch (12.5 mcg total) onto the skin every 3 (three) days. along with the patch  10 patch  0  . fentaNYL (DURAGESIC - DOSED MCG/HR) 25 MCG/HR Place 1 patch (25 mcg total) onto the skin every 3 (three) days.  10 patch  0  . hydrALAZINE (APRESOLINE) 50 MG tablet Take 0.5 tablets (25 mg total) by mouth 3 (three) times daily.  90 tablet  3  . Liotrix (THYROLAR-1/4) 15 (3.1-12.5) MG (MCG) TABS Take 1 tablet by mouth daily.  30 each  11  . lisinopril (PRINIVIL,ZESTRIL) 40 MG tablet Take 1 tablet (40 mg total) by mouth daily.  90 tablet  3  . Lutein-Zeaxanthin 6-1 MG TABS Take 1 tablet by mouth daily.        . Magnesium 400 MG CAPS Take 400 mg by mouth daily.      . Melatonin 3 MG CAPS Take 1 capsule by mouth daily.        . NONFORMULARY OR COMPOUNDED ITEM SOVEREIGN NANO  SILVER LIQUID      . phenazopyridine (PYRIDIUM) 100 MG tablet Take 1 tablet (100 mg total) by mouth 3 (three) times daily as needed.  30 tablet  5  . potassium chloride SA (K-DUR,KLOR-CON) 20 MEQ tablet Take 1 tablet (20 mEq total) by mouth 2 (two) times daily.  180 tablet  3  . Probiotic Product (ACIDOPHILUS PROBIOTIC BLEND) CAPS Take 1 capsule by mouth daily.        Marland Kitchen torsemide (DEMADEX) 20 MG tablet Take 1 tablet (20 mg total) by mouth daily.  90 tablet  3  . Valerian Root 450 MG CAPS Take 1 capsule by mouth daily.        . Vitamin Mixture (CHROMIUM PICOLINATE PO) Take by mouth as directed.         No current facility-administered medications on file prior to visit.    Allergies  Allergen Reactions  . Amoxicillin-Pot Clavulanate   . Cefuroxime Axetil   . Cephalexin     Felt uncomfortable  . Sulfonamide Derivatives     unknown    Past Medical History  Diagnosis Date  . HTN (hypertension)  difficult to control  . Hypothyroidism   . OP (osteoporosis)   . Fibromyalgia   . Edema of both legs     chronic  . Degenerative disk disease     cervical  . Follicular lymphoma     Quiescent. Dr. Sunday Corn; Dr. Elenore Rota  . Meningioma     Left frontal  . GERD (gastroesophageal reflux disease)   . Obesity   . Cerebral hemorrhage 2004    hypertensive  . Hx: UTI (urinary tract infection)   . Renal artery aneurysm     with stenosis-right; followed by Dr. Gilda Crease  . Diastolic CHF, chronic     Echo (9-11) with EF 65-70%, mild LVH, mid AI, Mild MR, PA systolic pressure >    Past Surgical History  Procedure Laterality Date  . Cataract extraction  10/05 and 4/07    Left then Right [facility])  . Abdominal hysterectomy  1958  . Cerebral hemorrhage  10/2002  . Cervical fusion  1985  . Rectocele repair  1982  . Cystocele repair  1982    Family History  Problem Relation Age of Onset  . Stroke Father   . Cancer Mother     Myeloma  . Hypertension      family    History    Social History  . Marital Status: Married    Spouse Name: N/A    Number of Children: N/A  . Years of Education: N/A   Occupational History  . Retired- Scientist, clinical (histocompatibility and immunogenetics) and taught    Social History Main Topics  . Smoking status: Never Smoker   . Smokeless tobacco: Never Used     Comment: Quit smoking 50 years ago  . Alcohol Use: No  . Drug Use: Not on file  . Sexually Active: Not on file   Other Topics Concern  . Not on file   Social History Narrative  . No narrative on file   Review of Systems No nausea for the most part---occ epigastric "pressure" Appetite is okay     Objective:   Physical Exam  Constitutional: She appears well-developed and well-nourished. No distress.  Abdominal: Soft. There is no tenderness.  Musculoskeletal:  No CVA tenderness          Assessment & Plan:

## 2012-09-30 NOTE — Assessment & Plan Note (Signed)
Ongoing dysuria Urinalysis is normal---not clear if she had infection and it has been treated or not infected Can stop the cipro  Okay to continue the pyridium ?try cranberry pills

## 2012-10-12 ENCOUNTER — Encounter: Payer: Self-pay | Admitting: Internal Medicine

## 2012-10-16 ENCOUNTER — Other Ambulatory Visit: Payer: Self-pay

## 2012-10-16 MED ORDER — FENTANYL 12 MCG/HR TD PT72: 1.0000 | MEDICATED_PATCH | TRANSDERMAL | Status: DC

## 2012-10-16 MED ORDER — FENTANYL 25 MCG/HR TD PT72: 1.0000 | MEDICATED_PATCH | TRANSDERMAL | Status: DC

## 2012-10-16 NOTE — Telephone Encounter (Signed)
Spoke with patient and advised rx ready for pick-up and it will be at the front desk.  

## 2012-10-16 NOTE — Telephone Encounter (Signed)
[name], pts daughter left v/m requesting rx for Fentanyl patch 12 mcg and 25 mcg. call when ready for pick up. (Dr Alphonsus Sias not available)

## 2012-10-16 NOTE — Telephone Encounter (Signed)
Px printed for pick up in IN box (by [place])  

## 2012-11-19 ENCOUNTER — Other Ambulatory Visit: Payer: Self-pay

## 2012-11-19 MED ORDER — FENTANYL 25 MCG/HR TD PT72: 1.0000 | MEDICATED_PATCH | TRANSDERMAL | Status: DC

## 2012-11-19 MED ORDER — FENTANYL 12 MCG/HR TD PT72: 1.0000 | MEDICATED_PATCH | TRANSDERMAL | Status: DC

## 2012-11-19 NOTE — Telephone Encounter (Signed)
Glenard Haring request rx for fentanyl 12 and 25 mcg. Call when ready for pick up.

## 2012-11-19 NOTE — Telephone Encounter (Signed)
Left message on machine that rx is ready for pick-up, and it will be at our front desk.  

## 2012-12-16 ENCOUNTER — Other Ambulatory Visit: Payer: Self-pay | Admitting: *Deleted

## 2012-12-16 MED ORDER — LIOTRIX (T3-T4) 15 (3.1-12.5) MG (MCG) PO TABS: 1.0000 | ORAL_TABLET | Freq: Every day | ORAL | Status: DC

## 2012-12-24 ENCOUNTER — Other Ambulatory Visit: Payer: Self-pay

## 2012-12-24 MED ORDER — FENTANYL 12 MCG/HR TD PT72: 1.0000 | MEDICATED_PATCH | TRANSDERMAL | Status: DC

## 2012-12-24 MED ORDER — FENTANYL 25 MCG/HR TD PT72: 1.0000 | MEDICATED_PATCH | TRANSDERMAL | Status: DC

## 2012-12-24 NOTE — Telephone Encounter (Signed)
Kayla [name] left v/m requesting rx for fentanyl 12 mcg and 25 mcg. Call when ready for pick up.

## 2012-12-24 NOTE — Telephone Encounter (Signed)
Left message on machine that rx is ready for pick-up, and it will be at our front desk.  

## 2012-12-24 NOTE — Telephone Encounter (Signed)
Kayla [name] called received missed call; Kayla [name] notified as instructed by telephone.

## 2012-12-28 ENCOUNTER — Telehealth: Payer: Self-pay

## 2012-12-28 NOTE — Telephone Encounter (Signed)
Please check on her today Most people with cough, even productive, start with a viral infection If she has fever or SOB, I should see her  In general , our rule is not to just treat people without a visit---this leads to improper treatment and often overtreatment

## 2012-12-28 NOTE — Telephone Encounter (Signed)
Kayla [name] said pt started having productive cough with yellow phlegm on 12/24/12. No fever,SOB or difficulty breathing,CP. Having head and chest congestion. [name] wants to know if med can be called in or does pt need be seen. [facility] [place].[name] request cb.

## 2012-12-28 NOTE — Telephone Encounter (Signed)
Discussed with daughter Molli Knock to try OTC dextromethorphan for the time being Already on narcotics  Will see tomorrow unless awakens and feels better

## 2012-12-28 NOTE — Telephone Encounter (Signed)
Pt has appt scheduled for tomorrow.  Her daughter wants to know if there is anything they can give her in the meantime for her symptoms.  They are giving her tylenol for her fever.

## 2012-12-28 NOTE — Telephone Encounter (Signed)
Kayla [name] called back had missed call; not sure who called; [name] said it is very difficult to get pt out of the house and prefers med called in rather than appt.Please advise.

## 2012-12-29 ENCOUNTER — Other Ambulatory Visit: Payer: Self-pay | Admitting: *Deleted

## 2012-12-29 ENCOUNTER — Encounter: Payer: Self-pay | Admitting: *Deleted

## 2012-12-29 ENCOUNTER — Encounter: Payer: Self-pay | Admitting: Family Medicine

## 2012-12-29 ENCOUNTER — Ambulatory Visit (INDEPENDENT_AMBULATORY_CARE_PROVIDER_SITE_OTHER)
Admission: RE | Admit: 2012-12-29 | Discharge: 2012-12-29 | Disposition: A | Discharge status: Home / Self Care | Payer: Medicare Other | Source: Ambulatory Visit | Attending: Family Medicine | Admitting: Family Medicine

## 2012-12-29 ENCOUNTER — Ambulatory Visit (INDEPENDENT_AMBULATORY_CARE_PROVIDER_SITE_OTHER): Payer: Medicare Other | Admitting: Family Medicine

## 2012-12-29 VITALS — BP 140/76 | HR 90 | Temp 100.8°F | Ht 63.0 in | Wt 272.5 lb

## 2012-12-29 DIAGNOSIS — R05 Cough: Secondary | ICD-10-CM

## 2012-12-29 DIAGNOSIS — J209 Acute bronchitis, unspecified: Secondary | ICD-10-CM | POA: Insufficient documentation

## 2012-12-29 MED ORDER — AZITHROMYCIN 250 MG PO TABS: ORAL_TABLET | ORAL | Status: DC

## 2012-12-29 MED ORDER — LEVOFLOXACIN 500 MG PO TABS: 500.0000 mg | ORAL_TABLET | Freq: Every day | ORAL | Status: DC

## 2012-12-29 NOTE — Progress Notes (Signed)
Subjective:    Patient ID: Kayla [name], female    DOB: 1924-01-04, 79 y.o.   MRN: [id number]  HPI Here for fever and cough  Sick for over a week  Cough that became productive  Now is worse   Rest of family got sick can't  Is sob and wheezing a bit  Green and yellow sputum  Some nasal congestion and drip No ear pain  No throat pain  Not hoarse   She has had a pneumovax 07 Never had any lung problems  Has never had pneumonia   Hx of lymphoma in the past -not now   Patient Active Problem List   Diagnosis Date Noted  . Cough 12/29/2012  . Acute bronchitis 12/29/2012  . Burning with urination 09/30/2012  . Neck pain, chronic 07/26/2010  . HYPERCALCEMIA 07/06/2010  . CHRONIC DIASTOLIC HEART FAILURE 05/14/2010  . INCONTINENCE 02/27/2009  . REFLUX ESOPHAGITIS 01/17/2009  . SUPERFICIAL THROMBOPHLEBITIS 07/26/2008  . BACK PAIN 04/10/2007  . HYPOTHYROIDISM 08/19/2006  . HYPERTENSION 08/19/2006  . DISC DISEASE, CERVICAL 08/19/2006  . FIBROMYALGIA 08/19/2006  . OSTEOPOROSIS 08/19/2006  . ANKLE EDEMA, CHRONIC 08/19/2006  . LYMPHOMA, NODULAR 11/03/2004   Past Medical History  Diagnosis Date  . HTN (hypertension)     difficult to control  . Hypothyroidism   . OP (osteoporosis)   . Fibromyalgia   . Edema of both legs     chronic  . Degenerative disk disease     cervical  . Follicular lymphoma     Quiescent. Dr. Sunday Corn; Dr. Elenore Rota  . Meningioma     Left frontal  . GERD (gastroesophageal reflux disease)   . Obesity   . Cerebral hemorrhage 2004    hypertensive  . Hx: UTI (urinary tract infection)   . Renal artery aneurysm     with stenosis-right; followed by Dr. Gilda Crease  . Diastolic CHF, chronic     Echo (9-11) with EF 65-70%, mild LVH, mid AI, Mild MR, PA systolic pressure >   Past Surgical History  Procedure Laterality Date  . Cataract extraction  10/05 and 4/07    Left then Right [facility])  . Abdominal hysterectomy  1958  . Cerebral hemorrhage   10/2002  . Cervical fusion  1985  . Rectocele repair  1982  . Cystocele repair  1982   History  Substance Use Topics  . Smoking status: Never Smoker   . Smokeless tobacco: Never Used     Comment: Quit smoking 50 years ago  . Alcohol Use: No   Family History  Problem Relation Age of Onset  . Stroke Father   . Cancer Mother     Myeloma  . Hypertension      family   Allergies  Allergen Reactions  . Amoxicillin-Pot Clavulanate   . Cefuroxime Axetil   . Cephalexin     Felt uncomfortable  . Levaquin [Levofloxacin In D5w]   . Sulfonamide Derivatives     unknown   Current Outpatient Prescriptions on File Prior to Visit  Medication Sig Dispense Refill  . aliskiren (TEKTURNA) 300 MG tablet Take 1 tablet (300 mg total) by mouth daily.  30 tablet  12  . aspirin 81 MG tablet Take 81 mg by mouth daily.        . carvedilol (COREG) 25 MG tablet Take 1 tablet (25 mg total) by mouth 2 (two) times daily with a meal.  180 tablet  3  . Casanthranol-Docusate Sodium (LAXATIVE PLUS STOOL SOFTENER) 30-100  MG CAPS Take 1 capsule by mouth 4 (four) times daily as needed.        . cromolyn (NASALCROM) 5.2 MG/ACT nasal spray Place 2 sprays into the nose daily.      . fentaNYL (DURAGESIC - DOSED MCG/HR) 12 MCG/HR Place 1 patch (12.5 mcg total) onto the skin every 3 (three) days. along with the patch  10 patch  0  . fentaNYL (DURAGESIC - DOSED MCG/HR) 25 MCG/HR patch Place 1 patch (25 mcg total) onto the skin every 3 (three) days.  10 patch  0  . hydrALAZINE (APRESOLINE) 50 MG tablet Take 0.5 tablets (25 mg total) by mouth 3 (three) times daily.  90 tablet  3  . Liotrix (THYROLAR-1/4) 15 (3.1-12.5) MG (MCG) TABS Take 1 tablet by mouth daily.  30 each  11  . lisinopril (PRINIVIL,ZESTRIL) 40 MG tablet Take 1 tablet (40 mg total) by mouth daily.  90 tablet  3  . Lutein-Zeaxanthin 6-1 MG TABS Take 1 tablet by mouth daily.        . Magnesium 400 MG CAPS Take 400 mg by mouth daily.      . Melatonin 3 MG  CAPS Take 1 capsule by mouth daily.        . NONFORMULARY OR COMPOUNDED ITEM SOVEREIGN NANO SILVER LIQUID      . phenazopyridine (PYRIDIUM) 100 MG tablet Take 1 tablet (100 mg total) by mouth 3 (three) times daily as needed.  30 tablet  5  . potassium chloride SA (K-DUR,KLOR-CON) 20 MEQ tablet Take 1 tablet (20 mEq total) by mouth 2 (two) times daily.  180 tablet  3  . Probiotic Product (ACIDOPHILUS PROBIOTIC BLEND) CAPS Take 1 capsule by mouth daily.        Marland Kitchen torsemide (DEMADEX) 20 MG tablet Take 1 tablet (20 mg total) by mouth daily.  90 tablet  3  . Valerian Root 450 MG CAPS Take 1 capsule by mouth daily.        . Vitamin Mixture (CHROMIUM PICOLINATE PO) Take by mouth as directed.         No current facility-administered medications on file prior to visit.    Review of Systems Review of Systems  Constitutional: Negative for appetite change,  and unexpected weight change. pos for fatigue and intermittent fever  ENT pos for cong/ rhinorrhea and drip  Eyes: Negative for pain and visual disturbance.  Respiratory: pos for cough and mild wheeze  Cardiovascular: Negative for cp or palpitations    Gastrointestinal: Negative for nausea, diarrhea and constipation.  Genitourinary: Negative for urgency and frequency.  Skin: Negative for pallor or rash   Neurological: Negative for weakness, light-headedness, numbness and headaches.  Hematological: Negative for adenopathy. Does not bruise/bleed easily.  Psychiatric/Behavioral: Negative for dysphoric mood. The patient is not nervous/anxious.         Objective:   Physical Exam  Constitutional: She appears well-developed and well-nourished. No distress.  Morbidly obese and in wheelchair  HENT:  Head: Normocephalic and atraumatic.  Right Ear: External ear normal.  Left Ear: External ear normal.  Mouth/Throat: Oropharynx is clear and moist. No oropharyngeal exudate.  Nares are injected and congested  No sinus tenderness Much clear post nasal  drip  Eyes: Conjunctivae and EOM are normal. Pupils are equal, round, and reactive to light. Right eye exhibits no discharge. Left eye exhibits no discharge. No scleral icterus.  Neck: Normal range of motion. Neck supple. No tracheal deviation present. No thyromegaly present.  Cardiovascular:  Normal rate and regular rhythm.   Pulmonary/Chest: Effort normal. No respiratory distress. She has wheezes. She has no rales. She exhibits no tenderness.  Scattered rhonchi louder in R side Scant wheeze with forced expiration No rales No crackles   Abdominal: Soft. Bowel sounds are normal.  Musculoskeletal: She exhibits no tenderness.  Lymphadenopathy:    She has no cervical adenopathy.  Neurological: She is alert. She has normal reflexes.  Skin: Skin is warm and dry. No rash noted. No erythema. No pallor.  Psychiatric: She has a normal mood and affect.          Assessment & Plan:

## 2012-12-29 NOTE — Patient Instructions (Addendum)
Continue mucinex to loosen mucous from cough Keep up a good fluid intake  Take the levaquin as directed  We will update you with xray radiology return when it comes back  If you get worse- please call or go to the hospital

## 2012-12-29 NOTE — Telephone Encounter (Signed)
Patient has appt today at 3:15 with Dr. Milinda Antis

## 2012-12-29 NOTE — Telephone Encounter (Signed)
.  left message to have patient's daughter return my call.  

## 2012-12-30 ENCOUNTER — Telehealth: Payer: Self-pay

## 2012-12-30 NOTE — Telephone Encounter (Signed)
No message

## 2012-12-30 NOTE — Telephone Encounter (Signed)
Pharmacy called yesterday and advise that levaquin was on their allergy list so Dr. Milinda Antis canceled the levaquin Rx and sent in the zpack, I called pt daughter's cell # and it was off so I advise the pharmacy to let her know.  I called the daughter back and let them know why we changed her abx and she is okay with the change

## 2012-12-30 NOTE — Telephone Encounter (Signed)
Dr Royden Purl note did state levaquin and it looks like she sent Rx for this Please confirm what is going on--may need to check with Dr T about what she wanted to use

## 2012-12-30 NOTE — Telephone Encounter (Signed)
Of note this was not on her allergy list in this chart - so it was added

## 2012-12-30 NOTE — Telephone Encounter (Signed)
Triage Record Num: [id number] Operator: Karenann Cai Patient Name: Kayla [name] Call Date & Time: 12/29/2012 8:25:02PM Patient Phone: [phone] PCP: Tillman Abide Patient Gender: Female PCP Fax : [phone] Patient DOB: [birth year] Practice Name: [facility] Reason for Call: Caller: Clair/Daughter; PCP: Tillman Abide (Family Practice); CB#: [phone]; reason for call: medication need. Patient was evaluated at the office by Dr. Milinda Antis: xray completed: no pneumonia. Daughter thought patient was to receive Levaquin. Patient was prescribed Azithromycin. RN accessed patient's EPIC chart and read information documented concerning CXR and information about patient: patient is to take Azithromycin (Zithromax). RN shared this information with Daughter. No triage. Protocol(s) Used: Medication Questions - Adult Recommended Outcome per Protocol: Provided Health Information Reason for Outcome: Caller has medication question(s) that was answered with available resources Care Advice: ~ 08/

## 2012-12-31 NOTE — Assessment & Plan Note (Signed)
No infiltrate seen on cxr  Whole family has had it  Warrants close f/u in this chronically ill elderly female  Px levaquin and then pharmacy notified of quinolone allergy (this was not in pt's chart) Changed to zpak Disc sympt care - mucinex  Update if worse or inc wheezing Will f/u with PCP fri or monday

## 2013-01-01 ENCOUNTER — Encounter: Payer: Self-pay | Admitting: Internal Medicine

## 2013-01-01 ENCOUNTER — Other Ambulatory Visit: Payer: Self-pay | Admitting: Cardiovascular Disease

## 2013-01-01 ENCOUNTER — Ambulatory Visit (INDEPENDENT_AMBULATORY_CARE_PROVIDER_SITE_OTHER): Payer: Medicare Other | Admitting: Internal Medicine

## 2013-01-01 VITALS — BP 150/80 | HR 60 | Temp 98.1°F | Resp 20

## 2013-01-01 DIAGNOSIS — J209 Acute bronchitis, unspecified: Secondary | ICD-10-CM

## 2013-01-01 MED ORDER — PREDNISONE 20 MG PO TABS: 40.0000 mg | ORAL_TABLET | Freq: Every day | ORAL | Status: DC

## 2013-01-01 MED ORDER — DOXYCYCLINE HYCLATE 100 MG PO TABS: 100.0000 mg | ORAL_TABLET | Freq: Two times a day (BID) | ORAL | Status: DC

## 2013-01-01 NOTE — Patient Instructions (Signed)
Please start the doxycycline and prednisone if you are not feeling better in the next 2-3 days

## 2013-01-01 NOTE — Assessment & Plan Note (Signed)
Persists though some improved Has tight cough but no wheezing now  Will just observe for now--finish the z-pak If not improved in 2-3 days, will try doxy and 5 days of prednisone

## 2013-01-01 NOTE — Progress Notes (Signed)
Subjective:    Patient ID: Kayla [name], female    DOB: 02/22/1924, 79 y.o.   MRN: [id number]  HPI Here with daughter She feels she is a little better--thinks she may need another round though  Still coughing---gets tight feeling with phlegm stuck in bronchi mucinex seems to be helping  Fever is gone Some SOB still--better than before No wheezing now---had earlier in the week  Current Outpatient Prescriptions on File Prior to Visit  Medication Sig Dispense Refill  . aliskiren (TEKTURNA) 300 MG tablet Take 1 tablet (300 mg total) by mouth daily.  30 tablet  12  . aspirin 81 MG tablet Take 81 mg by mouth daily.        Marland Kitchen azithromycin (ZITHROMAX) 250 MG tablet Take 2 tab today and 1 tab qd on day 2-4  6 tablet  0  . carvedilol (COREG) 25 MG tablet Take 1 tablet (25 mg total) by mouth 2 (two) times daily with a meal.  180 tablet  3  . Casanthranol-Docusate Sodium (LAXATIVE PLUS STOOL SOFTENER) 30-100 MG CAPS Take 1 capsule by mouth 4 (four) times daily as needed.        . cromolyn (NASALCROM) 5.2 MG/ACT nasal spray Place 2 sprays into the nose daily.      . fentaNYL (DURAGESIC - DOSED MCG/HR) 12 MCG/HR Place 1 patch (12.5 mcg total) onto the skin every 3 (three) days. along with the patch  10 patch  0  . fentaNYL (DURAGESIC - DOSED MCG/HR) 25 MCG/HR patch Place 1 patch (25 mcg total) onto the skin every 3 (three) days.  10 patch  0  . hydrALAZINE (APRESOLINE) 50 MG tablet Take 0.5 tablets (25 mg total) by mouth 3 (three) times daily.  90 tablet  3  . Liotrix (THYROLAR-1/4) 15 (3.1-12.5) MG (MCG) TABS Take 1 tablet by mouth daily.  30 each  11  . lisinopril (PRINIVIL,ZESTRIL) 40 MG tablet Take 1 tablet (40 mg total) by mouth daily.  90 tablet  3  . Lutein-Zeaxanthin 6-1 MG TABS Take 1 tablet by mouth daily.        . Magnesium 400 MG CAPS Take 400 mg by mouth daily.      . Melatonin 3 MG CAPS Take 1 capsule by mouth daily.        . NONFORMULARY OR COMPOUNDED ITEM SOVEREIGN NANO SILVER  LIQUID      . phenazopyridine (PYRIDIUM) 100 MG tablet Take 1 tablet (100 mg total) by mouth 3 (three) times daily as needed.  30 tablet  5  . potassium chloride SA (K-DUR,KLOR-CON) 20 MEQ tablet Take 1 tablet (20 mEq total) by mouth 2 (two) times daily.  180 tablet  3  . Probiotic Product (ACIDOPHILUS PROBIOTIC BLEND) CAPS Take 1 capsule by mouth daily.        Gerarda Fraction Root 450 MG CAPS Take 1 capsule by mouth daily.        . Vitamin Mixture (CHROMIUM PICOLINATE PO) Take by mouth as directed.         No current facility-administered medications on file prior to visit.    Allergies  Allergen Reactions  . Amoxicillin-Pot Clavulanate   . Cefuroxime Axetil   . Cephalexin     Felt uncomfortable  . Levaquin [Levofloxacin In D5w]   . Sulfonamide Derivatives     unknown    Past Medical History  Diagnosis Date  . HTN (hypertension)     difficult to control  . Hypothyroidism   .  OP (osteoporosis)   . Fibromyalgia   . Edema of both legs     chronic  . Degenerative disk disease     cervical  . Follicular lymphoma     Quiescent. Dr. Sunday Corn; Dr. Elenore Rota  . Meningioma     Left frontal  . GERD (gastroesophageal reflux disease)   . Obesity   . Cerebral hemorrhage 2004    hypertensive  . Hx: UTI (urinary tract infection)   . Renal artery aneurysm     with stenosis-right; followed by Dr. Gilda Crease  . Diastolic CHF, chronic     Echo (9-11) with EF 65-70%, mild LVH, mid AI, Mild MR, PA systolic pressure >    Past Surgical History  Procedure Laterality Date  . Cataract extraction  10/05 and 4/07    Left then Right [facility])  . Abdominal hysterectomy  1958  . Cerebral hemorrhage  10/2002  . Cervical fusion  1985  . Rectocele repair  1982  . Cystocele repair  1982    Family History  Problem Relation Age of Onset  . Stroke Father   . Cancer Mother     Myeloma  . Hypertension      family    History   Social History  . Marital Status: Married    Spouse Name: N/A     Number of Children: N/A  . Years of Education: N/A   Occupational History  . Retired- Scientist, clinical (histocompatibility and immunogenetics) and taught    Social History Main Topics  . Smoking status: Never Smoker   . Smokeless tobacco: Never Used     Comment: Quit smoking 50 years ago  . Alcohol Use: No  . Drug Use: Not on file  . Sexual Activity: Not on file   Other Topics Concern  . Not on file   Social History Narrative  . No narrative on file   Review of Systems Daughter and husband have also been sick Appetite is still poor--very little food    Objective:   Physical Exam  Constitutional: She appears well-developed. No distress.  Tight cough  Neck: Normal range of motion. Neck supple.  Cardiovascular: Normal rate, regular rhythm and normal heart sounds.  Exam reveals no gallop.   No murmur heard. Pulmonary/Chest: Effort normal and breath sounds normal. No respiratory distress. She has no wheezes. She has no rales.  Lymphadenopathy:    She has no cervical adenopathy.          Assessment & Plan:

## 2013-01-05 ENCOUNTER — Ambulatory Visit (INDEPENDENT_AMBULATORY_CARE_PROVIDER_SITE_OTHER): Payer: Medicare Other | Admitting: Internal Medicine

## 2013-01-05 ENCOUNTER — Encounter: Payer: Self-pay | Admitting: Internal Medicine

## 2013-01-05 VITALS — BP 150/80 | HR 74 | Temp 98.0°F

## 2013-01-05 DIAGNOSIS — R1032 Left lower quadrant pain: Secondary | ICD-10-CM

## 2013-01-05 LAB — CBC WITH DIFFERENTIAL/PLATELET
Eosinophils Relative: 0.8 % (ref 0.0–5.0)
HCT: 37.7 % (ref 36.0–46.0)
Hemoglobin: 12.3 g/dL (ref 12.0–15.0)
Lymphs Abs: 1 10*3/uL (ref 0.7–4.0)
MCV: 88.1 fl (ref 78.0–100.0)
Monocytes Absolute: 0.6 10*3/uL (ref 0.1–1.0)
Monocytes Relative: 6.7 % (ref 3.0–12.0)
Neutro Abs: 7 10*3/uL (ref 1.4–7.7)
Platelets: 379 10*3/uL (ref 150.0–400.0)
WBC: 8.7 10*3/uL (ref 4.5–10.5)

## 2013-01-05 LAB — HEPATIC FUNCTION PANEL
ALT: 21 U/L (ref 0–35)
AST: 19 U/L (ref 0–37)
Albumin: 3.4 g/dL — ABNORMAL LOW (ref 3.5–5.2)
Alkaline Phosphatase: 54 U/L (ref 39–117)
Total Protein: 7.1 g/dL (ref 6.0–8.3)

## 2013-01-05 LAB — BASIC METABOLIC PANEL
BUN: 13 mg/dL (ref 6–23)
Chloride: 97 mEq/L (ref 96–112)
GFR: 73.11 mL/min (ref >60.00)
Glucose, Bld: 106 mg/dL — ABNORMAL HIGH (ref 70–99)
Potassium: 3.6 mEq/L (ref 3.5–5.1)
Sodium: 136 mEq/L (ref 135–145)

## 2013-01-05 LAB — LIPASE: Lipase: 23 U/L (ref 11.0–59.0)

## 2013-01-05 LAB — TSH: TSH: 1.66 u[IU]/mL (ref 0.35–5.50)

## 2013-01-05 MED ORDER — HYOSCYAMINE SULFATE 0.125 MG SL SUBL: 0.1250 mg | SUBLINGUAL_TABLET | Freq: Two times a day (BID) | SUBLINGUAL | Status: DC | PRN

## 2013-01-05 NOTE — Progress Notes (Signed)
Subjective:    Patient ID: Kayla [name], female    DOB: June 27, 1923, 79 y.o.   MRN: [id number]  HPI Here with daughter  Seems to be over the chest problems Cough is better--and dry  Feels she has been having a problem in LMQ and LLQ for some time Has sense of something "moving around" Can give epigastric problems and even up to throat Seemed to be worse with the antibiotic--- pain in LLQ before her bowels would move (and then it would feel better) "There's something wrong in there, but I don't know where"  Appetite is off now--only has 2 meals per day for quite some time (since she knows she is inactive) Takes stool softener to help her bowels No blood in stool  No colonoscopy in many years No excited about this again  Current Outpatient Prescriptions on File Prior to Visit  Medication Sig Dispense Refill  . aliskiren (TEKTURNA) 300 MG tablet Take 1 tablet (300 mg total) by mouth daily.  30 tablet  12  . aspirin 81 MG tablet Take 81 mg by mouth daily.        . carvedilol (COREG) 25 MG tablet Take 1 tablet (25 mg total) by mouth 2 (two) times daily with a meal.  180 tablet  3  . Casanthranol-Docusate Sodium (LAXATIVE PLUS STOOL SOFTENER) 30-100 MG CAPS Take 1 capsule by mouth 4 (four) times daily as needed.        . cromolyn (NASALCROM) 5.2 MG/ACT nasal spray Place 2 sprays into the nose daily.      Marland Kitchen doxycycline (VIBRA-TABS) 100 MG tablet Take 1 tablet (100 mg total) by mouth 2 (two) times daily.  20 tablet  0  . fentaNYL (DURAGESIC - DOSED MCG/HR) 12 MCG/HR Place 1 patch (12.5 mcg total) onto the skin every 3 (three) days. along with the patch  10 patch  0  . fentaNYL (DURAGESIC - DOSED MCG/HR) 25 MCG/HR patch Place 1 patch (25 mcg total) onto the skin every 3 (three) days.  10 patch  0  . hydrALAZINE (APRESOLINE) 50 MG tablet Take 0.5 tablets (25 mg total) by mouth 3 (three) times daily.  90 tablet  3  . Liotrix (THYROLAR-1/4) 15 (3.1-12.5) MG (MCG) TABS Take 1 tablet by  mouth daily.  30 each  11  . lisinopril (PRINIVIL,ZESTRIL) 40 MG tablet Take 1 tablet (40 mg total) by mouth daily.  90 tablet  3  . Lutein-Zeaxanthin 6-1 MG TABS Take 1 tablet by mouth daily.        . Magnesium 400 MG CAPS Take 400 mg by mouth daily.      . Melatonin 3 MG CAPS Take 1 capsule by mouth daily.        . NONFORMULARY OR COMPOUNDED ITEM SOVEREIGN NANO SILVER LIQUID      . phenazopyridine (PYRIDIUM) 100 MG tablet Take 1 tablet (100 mg total) by mouth 3 (three) times daily as needed.  30 tablet  5  . potassium chloride SA (K-DUR,KLOR-CON) 20 MEQ tablet Take 1 tablet (20 mEq total) by mouth 2 (two) times daily.  180 tablet  3  . predniSONE (DELTASONE) 20 MG tablet Take 2 tablets (40 mg total) by mouth daily.  10 tablet  0  . Probiotic Product (ACIDOPHILUS PROBIOTIC BLEND) CAPS Take 1 capsule by mouth daily.        Marland Kitchen torsemide (DEMADEX) 20 MG tablet TAKE ONE TABLET BY MOUTH EVERY DAY  90 tablet  0  . Valerian Root  450 MG CAPS Take 1 capsule by mouth daily.        . Vitamin Mixture (CHROMIUM PICOLINATE PO) Take by mouth as directed.         No current facility-administered medications on file prior to visit.    Allergies  Allergen Reactions  . Amoxicillin-Pot Clavulanate   . Cefuroxime Axetil   . Cephalexin     Felt uncomfortable  . Levaquin [Levofloxacin In D5w]   . Sulfonamide Derivatives     unknown    Past Medical History  Diagnosis Date  . HTN (hypertension)     difficult to control  . Hypothyroidism   . OP (osteoporosis)   . Fibromyalgia   . Edema of both legs     chronic  . Degenerative disk disease     cervical  . Follicular lymphoma     Quiescent. Dr. Sunday Corn; Dr. Elenore Rota  . Meningioma     Left frontal  . GERD (gastroesophageal reflux disease)   . Obesity   . Cerebral hemorrhage 2004    hypertensive  . Hx: UTI (urinary tract infection)   . Renal artery aneurysm     with stenosis-right; followed by Dr. Gilda Crease  . Diastolic CHF, chronic     Echo (9-11)  with EF 65-70%, mild LVH, mid AI, Mild MR, PA systolic pressure >    Past Surgical History  Procedure Laterality Date  . Cataract extraction  10/05 and 4/07    Left then Right [facility])  . Abdominal hysterectomy  1958  . Cerebral hemorrhage  10/2002  . Cervical fusion  1985  . Rectocele repair  1982  . Cystocele repair  1982    Family History  Problem Relation Age of Onset  . Stroke Father   . Cancer Mother     Myeloma  . Hypertension      family    History   Social History  . Marital Status: Married    Spouse Name: N/A    Number of Children: N/A  . Years of Education: N/A   Occupational History  . Retired- Scientist, clinical (histocompatibility and immunogenetics) and taught    Social History Main Topics  . Smoking status: Never Smoker   . Smokeless tobacco: Never Used     Comment: Quit smoking 50 years ago  . Alcohol Use: No  . Drug Use: Not on file  . Sexual Activity: Not on file   Other Topics Concern  . Not on file   Social History Narrative  . No narrative on file   Review of Systems Weight is stable Feels her hiatal hernia is connected to the LLQ symptoms Has tried to stay off gluten---feels this may have helped. No mints  Takes [place] vitamin C drops--discussed avoiding sorbitol    Objective:   Physical Exam  Constitutional: She appears well-developed and well-nourished. No distress.  Neck: Normal range of motion. Neck supple.  Pulmonary/Chest: Effort normal and breath sounds normal. No respiratory distress. She has no wheezes. She has no rales.  Abdominal: Soft. Bowel sounds are normal. She exhibits no distension and no mass. There is no tenderness. There is no rebound and no guarding.  Musculoskeletal: Normal range of motion.  Psychiatric: She has a normal mood and affect.          Assessment & Plan:

## 2013-01-05 NOTE — Assessment & Plan Note (Signed)
Symptom complex sounds like IBS  Hasn't had colonoscopy in 20 years---not excited about this Some degree of constipation Likely benign but could be a serious thing and it really has bothered her  Will do GI referral Trial low dose levsin (discussed side effects)

## 2013-01-05 NOTE — Patient Instructions (Signed)
Please try a daily over the counter fiber supplement

## 2013-01-07 ENCOUNTER — Telehealth: Payer: Self-pay | Admitting: Gastroenterology

## 2013-01-07 NOTE — Telephone Encounter (Signed)
Pt scheduled to see Willette Cluster NP tomorrow at 1:30pm. Pt aware of appt date and time.

## 2013-01-08 ENCOUNTER — Encounter: Payer: Self-pay | Admitting: Nurse Practitioner

## 2013-01-08 ENCOUNTER — Ambulatory Visit (INDEPENDENT_AMBULATORY_CARE_PROVIDER_SITE_OTHER): Payer: Medicare Other | Admitting: Nurse Practitioner

## 2013-01-08 VITALS — BP 168/80 | HR 72 | Ht 63.0 in | Wt 272.0 lb

## 2013-01-08 DIAGNOSIS — R1084 Generalized abdominal pain: Secondary | ICD-10-CM

## 2013-01-08 DIAGNOSIS — R109 Unspecified abdominal pain: Secondary | ICD-10-CM

## 2013-01-08 NOTE — Patient Instructions (Signed)
Please call us if you have symptoms reoccur

## 2013-01-08 NOTE — Progress Notes (Signed)
  History of Present Illness:  Patient is an 79 year old female known to Dr. Arlyce Dice though not been here in about 4 years. At her last visit in 2010 patient was evaluated for upper abdominal pain. EGD revealed some benign gastric nodules and distal esophagitis.   A few weeks ago during a course of Zithromax for bronchitis patient developed "blow outs" of her bowels. Stool was loose, nonbloody. Stools eventually normalized but patient has developed problems with significant gas pain. Pain diffuse, migratory.  PCP prescribed fiber and antispasmodic. Patient has added Gasx and symptoms have resolved. Despite resolution of symptoms patient wanted to followup with this appointment today.    No other acute gastrointestinal complaints. She has had mild LLQ discomfort during defecation for several years. This has not changed in character.  No rectal bleeding. Labs by PCP 3 days ago, including a comprehensive metabolic profile, lipase and CBC were okay  Current Medications, Allergies, Past Medical History, Past Surgical History, Family History and Social History were reviewed in [employer] record.  Physical Exam: General: Pleasant , obese , white female in a wheelchair in no acute distress Head: Normocephalic and atraumatic Eyes:  sclerae anicteric, conjunctiva pink  Ears: Normal auditory acuity Lungs: Clear throughout to auscultation Heart: Regular rate and rhythm. Positive murmur Abdomen: Soft, non distended, non-tender. No masses, no hepatomegaly. Normal bowel sounds Musculoskeletal: Symmetrical with no gross deformities  Extremities: Massive bilateral lower extremity edema with stasis changes.   Neurological: Alert oriented x 4, grossly nonfocal Psychological:  Alert and cooperative. Normal mood and affect  Assessment and Recommendations: pleasant 79 year old female presenting with complaints of diffuse/migrating gas pains following a course of antibiotics. She is taking a  probiotic at home. Increased gas could be secondary to altered flora related to antibiotics. Patient looks okay. She wanted to follow through with this appointment today though since making the appointment her symptoms have resolved with bowel antispasmodics and Gas-X. Reassurance provided. Patient will call the office for recurrent and/or new gastrointestinal symptoms. Of note, her last colonoscopy was 20 plus years ago, she really is not interested in having another one. This is reasonable given her advanced age.

## 2013-01-11 DIAGNOSIS — R1084 Generalized abdominal pain: Secondary | ICD-10-CM | POA: Insufficient documentation

## 2013-01-11 NOTE — Progress Notes (Signed)
Reviewed and agree with management. Yizel Canby D. Franchon Ketterman, M.D., FACG  

## 2013-01-25 ENCOUNTER — Other Ambulatory Visit: Payer: Self-pay

## 2013-01-25 ENCOUNTER — Other Ambulatory Visit: Payer: Self-pay | Admitting: Cardiovascular Disease

## 2013-01-25 MED ORDER — FENTANYL 12 MCG/HR TD PT72: 1.0000 | MEDICATED_PATCH | TRANSDERMAL | Status: DC

## 2013-01-25 MED ORDER — FENTANYL 25 MCG/HR TD PT72: 1.0000 | MEDICATED_PATCH | TRANSDERMAL | Status: DC

## 2013-01-25 NOTE — Telephone Encounter (Signed)
Spoke with patient and advised rx ready for pick-up and it will be at the front desk.  

## 2013-01-25 NOTE — Telephone Encounter (Signed)
Glenard Haring request rx fentanyl 12 mcg and 25 mcg. Call when ready for pick up.

## 2013-01-29 ENCOUNTER — Encounter: Payer: Self-pay | Admitting: Internal Medicine

## 2013-01-29 ENCOUNTER — Ambulatory Visit (INDEPENDENT_AMBULATORY_CARE_PROVIDER_SITE_OTHER): Payer: Medicare Other | Admitting: Internal Medicine

## 2013-01-29 VITALS — BP 150/80 | HR 86 | Temp 97.9°F | Wt 271.0 lb

## 2013-01-29 DIAGNOSIS — I10 Essential (primary) hypertension: Secondary | ICD-10-CM

## 2013-01-29 DIAGNOSIS — M5030 Other cervical disc degeneration, unspecified cervical region: Secondary | ICD-10-CM

## 2013-01-29 DIAGNOSIS — I872 Venous insufficiency (chronic) (peripheral): Secondary | ICD-10-CM

## 2013-01-29 DIAGNOSIS — I5032 Chronic diastolic (congestive) heart failure: Secondary | ICD-10-CM

## 2013-01-29 NOTE — Assessment & Plan Note (Signed)
Doing okay on the fentanyl

## 2013-01-29 NOTE — Assessment & Plan Note (Signed)
Compensated. No changes needed. 

## 2013-01-29 NOTE — Progress Notes (Signed)
Subjective:    Patient ID: Kayla [name], female    DOB: 09-15-1923, 79 y.o.   MRN: [id number]  HPI Here with daughter [name]  "My whole being is better" Back to walking with cane GI pain better--- may have been responding to gas-x   Breathing has been better No chest pain Edema is stable  Ongoing pain Feels she has reasonable control on the patch Only uses tylenol otherwise  Current Outpatient Prescriptions on File Prior to Visit  Medication Sig Dispense Refill  . aliskiren (TEKTURNA) 300 MG tablet Take 1 tablet (300 mg total) by mouth daily.  30 tablet  12  . aspirin 81 MG tablet Take 81 mg by mouth daily.        . carvedilol (COREG) 25 MG tablet Take 1 tablet (25 mg total) by mouth 2 (two) times daily with a meal.  180 tablet  3  . Casanthranol-Docusate Sodium (LAXATIVE PLUS STOOL SOFTENER) 30-100 MG CAPS Take 1 capsule by mouth 4 (four) times daily as needed.        . cromolyn (NASALCROM) 5.2 MG/ACT nasal spray Place 2 sprays into the nose daily.      . fentaNYL (DURAGESIC - DOSED MCG/HR) 12 MCG/HR Place 1 patch (12.5 mcg total) onto the skin every 3 (three) days. along with the patch  10 patch  0  . fentaNYL (DURAGESIC - DOSED MCG/HR) 25 MCG/HR patch Place 1 patch (25 mcg total) onto the skin every 3 (three) days.  10 patch  0  . hydrALAZINE (APRESOLINE) 50 MG tablet Take 0.5 tablets (25 mg total) by mouth 3 (three) times daily.  90 tablet  3  . KLOR-CON M20 20 MEQ tablet TAKE ONE TABLET BY MOUTH TWICE DAILY  60 tablet  0  . Liotrix (THYROLAR-1/4) 15 (3.1-12.5) MG (MCG) TABS Take 1 tablet by mouth daily.  30 each  11  . lisinopril (PRINIVIL,ZESTRIL) 40 MG tablet Take 1 tablet (40 mg total) by mouth daily.  90 tablet  3  . Lutein-Zeaxanthin 6-1 MG TABS Take 1 tablet by mouth daily.        . Magnesium 400 MG CAPS Take 400 mg by mouth daily.      . Melatonin 3 MG CAPS Take 1 capsule by mouth daily.        . NONFORMULARY OR COMPOUNDED ITEM SOVEREIGN NANO SILVER LIQUID      .  phenazopyridine (PYRIDIUM) 100 MG tablet Take 1 tablet (100 mg total) by mouth 3 (three) times daily as needed.  30 tablet  5  . Probiotic Product (ACIDOPHILUS PROBIOTIC BLEND) CAPS Take 1 capsule by mouth daily.        Marland Kitchen torsemide (DEMADEX) 20 MG tablet TAKE ONE TABLET BY MOUTH EVERY DAY  90 tablet  0  . Valerian Root 450 MG CAPS Take 1 capsule by mouth daily.        . Vitamin Mixture (CHROMIUM PICOLINATE PO) Take by mouth as directed.         No current facility-administered medications on file prior to visit.    Allergies  Allergen Reactions  . Amoxicillin-Pot Clavulanate   . Cefuroxime Axetil   . Cephalexin     Felt uncomfortable  . Levaquin [Levofloxacin In D5w]   . Sulfonamide Derivatives     unknown    Past Medical History  Diagnosis Date  . HTN (hypertension)     difficult to control  . Hypothyroidism   . OP (osteoporosis)   . Fibromyalgia   .  Edema of both legs     chronic  . Degenerative disk disease     cervical  . Follicular lymphoma     Quiescent. Dr. Sunday Corn; Dr. Elenore Rota  . Meningioma     Left frontal  . GERD (gastroesophageal reflux disease)   . Obesity   . Cerebral hemorrhage 2004    hypertensive  . Hx: UTI (urinary tract infection)   . Renal artery aneurysm     with stenosis-right; followed by Dr. Gilda Crease  . Diastolic CHF, chronic     Echo (9-11) with EF 65-70%, mild LVH, mid AI, Mild MR, PA systolic pressure >    Past Surgical History  Procedure Laterality Date  . Cataract extraction  10/05 and 4/07    Left then Right [facility])  . Abdominal hysterectomy  1958  . Cerebral hemorrhage  10/2002  . Cervical fusion  1985  . Rectocele repair  1982  . Cystocele repair  1982    Family History  Problem Relation Age of Onset  . Stroke Father   . Cancer Mother     Myeloma  . Hypertension      family    History   Social History  . Marital Status: Married    Spouse Name: N/A    Number of Children: N/A  . Years of Education: N/A    Occupational History  . Retired- Scientist, clinical (histocompatibility and immunogenetics) and taught    Social History Main Topics  . Smoking status: Never Smoker   . Smokeless tobacco: Never Used     Comment: Quit smoking 50 years ago  . Alcohol Use: No  . Drug Use: Not on file  . Sexual Activity: Not on file   Other Topics Concern  . Not on file   Social History Narrative   Has living will   Daughter [name] is health care POA   Has DNR order already-- reviewed and form redone 01/29/13   No tube feeds if cognitively unaware   Review of Systems Sleep is variable-- in chair at night and naps Appetite is fine Weight stable Bowels okay with the meds (prunes/stool softener)    Objective:   Physical Exam  Constitutional: She appears well-developed and well-nourished. No distress.  Neck: Normal range of motion. No thyromegaly present.  Cardiovascular: Normal rate, regular rhythm and normal heart sounds.  Exam reveals no gallop.   No murmur heard. Pulmonary/Chest: Effort normal and breath sounds normal. No respiratory distress. She has no wheezes. She has no rales.  Abdominal: Soft. There is no tenderness.  Musculoskeletal: She exhibits edema.  4+ calf edema No ulcers Venous stasis changes on lower calves  Lymphadenopathy:    She has no cervical adenopathy.  Psychiatric: She has a normal mood and affect. Her behavior is normal.          Assessment & Plan:

## 2013-01-29 NOTE — Assessment & Plan Note (Signed)
No ulcers or pain now

## 2013-01-29 NOTE — Assessment & Plan Note (Signed)
BP Readings from Last 3 Encounters:  01/29/13 150/80  01/08/13 168/80  01/05/13 150/80   Reasonable control for her age No changes

## 2013-02-09 ENCOUNTER — Other Ambulatory Visit: Payer: Self-pay | Admitting: Cardiovascular Disease

## 2013-02-13 ENCOUNTER — Other Ambulatory Visit: Payer: Self-pay | Admitting: Cardiovascular Disease

## 2013-02-15 ENCOUNTER — Other Ambulatory Visit: Payer: Self-pay | Admitting: *Deleted

## 2013-02-15 MED ORDER — HYDRALAZINE HCL 50 MG PO TABS: 25.0000 mg | ORAL_TABLET | Freq: Three times a day (TID) | ORAL | Status: DC

## 2013-02-15 NOTE — Telephone Encounter (Signed)
Called patient and advised needs OV with Dr.Arida. Was due to be seen March 2014. She will have her daughter call back to schedule.

## 2013-02-16 ENCOUNTER — Ambulatory Visit (INDEPENDENT_AMBULATORY_CARE_PROVIDER_SITE_OTHER): Payer: Medicare Other | Admitting: Cardiovascular Disease

## 2013-02-16 ENCOUNTER — Encounter: Payer: Self-pay | Admitting: Cardiovascular Disease

## 2013-02-16 VITALS — BP 177/90 | HR 67 | Ht 62.0 in | Wt 275.5 lb

## 2013-02-16 DIAGNOSIS — I5032 Chronic diastolic (congestive) heart failure: Secondary | ICD-10-CM

## 2013-02-16 DIAGNOSIS — I10 Essential (primary) hypertension: Secondary | ICD-10-CM

## 2013-02-16 MED ORDER — HYDRALAZINE HCL 25 MG PO TABS: 25.0000 mg | ORAL_TABLET | Freq: Three times a day (TID) | ORAL | Status: DC

## 2013-02-16 NOTE — Assessment & Plan Note (Signed)
She has been stable on current medications including small dose torsemide. She appears to be euvolemic. No changes were made in her medications.

## 2013-02-16 NOTE — Patient Instructions (Signed)
Continue same medications.   Your physician wants you to follow-up in: 6 months.  You will receive a reminder letter in the mail two months in advance. If you don't receive a letter, please call our office to schedule the follow-up appointment.  

## 2013-02-16 NOTE — Assessment & Plan Note (Signed)
Blood pressure is elevated but always better on home readings. Continue same medications. We can consider increasing hydralazine in the future.

## 2013-02-16 NOTE — Progress Notes (Signed)
HPI  This is an 79 year old female who is here today for a followup visit. She has a history of HTN, morbid obesity, chronic diastolic heart failure and chronic lower extremity edema/lymphedema. She has had leg swelling bilaterally for > 10 years. She has been seen by vascular surgery in [place] and is thought to have a component of venous insufficiency and, I suspect, lymphedema. She cannot wear compression stockings (unable to put them on). She has been short of breath after walking about 30 feet chronically. She sleeps with 3 pillows at night, mainly for GERD rather than orthopnea. No chest pain/tightness. SBP at home has been running in the 120s-150 according to her home blood pressure readings.  Most recent echocardiogram was in 2011 which showed normal ejection fraction. She has been doing reasonably well. She denies any change in her symptoms. No chest pain.   Allergies  Allergen Reactions  . Amoxicillin-Pot Clavulanate   . Cefuroxime Axetil   . Cephalexin     Felt uncomfortable  . Levaquin [Levofloxacin In D5w]   . Sulfonamide Derivatives     unknown     Current Outpatient Prescriptions on File Prior to Visit  Medication Sig Dispense Refill  . aliskiren (TEKTURNA) 300 MG tablet Take 1 tablet (300 mg total) by mouth daily.  30 tablet  12  . aspirin 81 MG tablet Take 81 mg by mouth daily.        . carvedilol (COREG) 25 MG tablet Take 1 tablet (25 mg total) by mouth 2 (two) times daily with a meal.  180 tablet  3  . Casanthranol-Docusate Sodium (LAXATIVE PLUS STOOL SOFTENER) 30-100 MG CAPS Take 1 capsule by mouth 4 (four) times daily as needed.        . cromolyn (NASALCROM) 5.2 MG/ACT nasal spray Place 2 sprays into the nose daily.      . fentaNYL (DURAGESIC - DOSED MCG/HR) 12 MCG/HR Place 1 patch (12.5 mcg total) onto the skin every 3 (three) days. along with the patch  10 patch  0  . fentaNYL (DURAGESIC - DOSED MCG/HR) 25 MCG/HR patch Place 1 patch (25 mcg total) onto  the skin every 3 (three) days.  10 patch  0  . hydrALAZINE (APRESOLINE) 50 MG tablet Take 0.5 tablets (25 mg total) by mouth 3 (three) times daily.  90 tablet  0  . KLOR-CON M20 20 MEQ tablet TAKE ONE TABLET BY MOUTH TWICE DAILY  60 tablet  0  . Liotrix (THYROLAR-1/4) 15 (3.1-12.5) MG (MCG) TABS Take 1 tablet by mouth daily.  30 each  11  . lisinopril (PRINIVIL,ZESTRIL) 40 MG tablet Take 1 tablet (40 mg total) by mouth daily.  90 tablet  3  . Lutein-Zeaxanthin 6-1 MG TABS Take 1 tablet by mouth daily.        . Magnesium 400 MG CAPS Take 400 mg by mouth daily.      . Melatonin 3 MG CAPS Take 1 capsule by mouth daily.        . NONFORMULARY OR COMPOUNDED ITEM SOVEREIGN NANO SILVER LIQUID      . phenazopyridine (PYRIDIUM) 100 MG tablet Take 1 tablet (100 mg total) by mouth 3 (three) times daily as needed.  30 tablet  5  . Probiotic Product (ACIDOPHILUS PROBIOTIC BLEND) CAPS Take 1 capsule by mouth daily.        . Simethicone (GAS-X MAXIMUM STRENGTH PO) Take 2 tablets by mouth daily.      Marland Kitchen torsemide (DEMADEX) 20 MG  tablet TAKE ONE TABLET BY MOUTH EVERY DAY  90 tablet  0  . Valerian Root 450 MG CAPS Take 1 capsule by mouth daily.        . Vitamin Mixture (CHROMIUM PICOLINATE PO) Take by mouth as directed.         No current facility-administered medications on file prior to visit.     Past Medical History  Diagnosis Date  . HTN (hypertension)     difficult to control  . Hypothyroidism   . OP (osteoporosis)   . Fibromyalgia   . Edema of both legs     chronic  . Degenerative disk disease     cervical  . Follicular lymphoma     Quiescent. Dr. Sunday Corn; Dr. Elenore Rota  . Meningioma     Left frontal  . GERD (gastroesophageal reflux disease)   . Obesity   . Cerebral hemorrhage 2004    hypertensive  . Hx: UTI (urinary tract infection)   . Renal artery aneurysm     with stenosis-right; followed by Dr. Gilda Crease  . Diastolic CHF, chronic     Echo (9-11) with EF 65-70%, mild LVH, mid AI, Mild  MR, PA systolic pressure >     Past Surgical History  Procedure Laterality Date  . Cataract extraction  10/05 and 4/07    Left then Right [facility])  . Abdominal hysterectomy  1958  . Cerebral hemorrhage  10/2002  . Cervical fusion  1985  . Rectocele repair  1982  . Cystocele repair  1982     Family History  Problem Relation Age of Onset  . Stroke Father   . Cancer Mother     Myeloma  . Hypertension      family     History   Social History  . Marital Status: Married    Spouse Name: N/A    Number of Children: N/A  . Years of Education: N/A   Occupational History  . Retired- Scientist, clinical (histocompatibility and immunogenetics) and taught    Social History Main Topics  . Smoking status: Never Smoker   . Smokeless tobacco: Never Used     Comment: Quit smoking 50 years ago  . Alcohol Use: No  . Drug Use: No  . Sexual Activity: Not on file   Other Topics Concern  . Not on file   Social History Narrative   Has living will   Daughter Kayla [name] is health care POA   Has DNR order already-- reviewed and form redone 01/29/13   No tube feeds if cognitively unaware     PHYSICAL EXAM   Ht 5\' 2"  (1.575 m)  Wt 275 lb 8 oz (124.966 kg)  BMI 50.38 kg/m2  Constitutional: She is oriented to person, place, and time. She appears well-developed and well-nourished. No distress.  HENT: No nasal discharge.  Head: Normocephalic and atraumatic.  Eyes: Pupils are equal and round. Right eye exhibits no discharge. Left eye exhibits no discharge.  Neck: Normal range of motion. Neck supple. No JVD present. No thyromegaly present.  Cardiovascular: Normal rate, regular rhythm, normal heart sounds. Exam reveals no gallop and no friction rub. 2/6 systolic murmur at the base.  Pulmonary/Chest: Effort normal and breath sounds normal. No stridor. No respiratory distress. She has no wheezes. She has no rales. She exhibits no tenderness.  Abdominal: Soft. Bowel sounds are normal. She exhibits no distension. There is no  tenderness. There is no rebound and no guarding.  Musculoskeletal: Normal range of motion. She exhibits chronic edema  and no tenderness.  Neurological: She is alert and oriented to person, place, and time. Coordination normal.  Skin: Skin is warm and dry. No rash noted. She is not diaphoretic. No erythema. No pallor.  Psychiatric: She has a normal mood and affect. Her behavior is normal. Judgment and thought content normal.    EKG: Sinus  Rhythm  -First degree A-V block  PRi = 230 -Left axis -anterior fascicular block.   Voltage criteria for LVH  (R(aVL) exceeds 1.01 mV).   -Poor R-wave progression -may be secondary to left ventricular hypertrophy   consider old anterior infarct.   -  T-abnormality  -Lateral ischemia.   ABNORMAL     ASSESSMENT AND PLAN

## 2013-03-02 ENCOUNTER — Other Ambulatory Visit: Payer: Self-pay | Admitting: *Deleted

## 2013-03-02 MED ORDER — FENTANYL 25 MCG/HR TD PT72: 1.0000 | MEDICATED_PATCH | TRANSDERMAL | Status: DC

## 2013-03-02 MED ORDER — FENTANYL 12 MCG/HR TD PT72: 1.0000 | MEDICATED_PATCH | TRANSDERMAL | Status: DC

## 2013-03-02 NOTE — Telephone Encounter (Signed)
Spoke with patient and advised rx ready for pick-up and it will be at the front desk.  

## 2013-03-11 ENCOUNTER — Other Ambulatory Visit: Payer: Self-pay

## 2013-03-20 ENCOUNTER — Other Ambulatory Visit: Payer: Self-pay | Admitting: Cardiovascular Disease

## 2013-03-22 ENCOUNTER — Other Ambulatory Visit: Payer: Self-pay | Admitting: *Deleted

## 2013-03-22 MED ORDER — POTASSIUM CHLORIDE CRYS ER 20 MEQ PO TBCR: EXTENDED_RELEASE_TABLET | ORAL | Status: DC

## 2013-03-22 MED ORDER — CARVEDILOL 25 MG PO TABS: 25.0000 mg | ORAL_TABLET | Freq: Two times a day (BID) | ORAL | Status: DC

## 2013-03-22 NOTE — Telephone Encounter (Signed)
Requested Prescriptions   Signed Prescriptions Disp Refills  . carvedilol (COREG) 25 MG tablet 180 tablet 3    Sig: Take 1 tablet (25 mg total) by mouth 2 (two) times daily with a meal.    Authorizing Provider: Lorine Bears A    Ordering User: Kiegan Macaraeg C  . potassium chloride SA (KLOR-CON M20) 20 MEQ tablet 60 tablet 3    Sig: TAKE ONE TABLET BY MOUTH TWICE DAILY    Authorizing Provider: Lorine Bears A    Ordering User: Kendrick Fries

## 2013-03-23 ENCOUNTER — Other Ambulatory Visit: Payer: Self-pay | Admitting: Cardiovascular Disease

## 2013-03-23 ENCOUNTER — Other Ambulatory Visit: Payer: Self-pay | Admitting: *Deleted

## 2013-03-23 MED ORDER — TORSEMIDE 20 MG PO TABS: ORAL_TABLET | ORAL | Status: DC

## 2013-03-23 NOTE — Telephone Encounter (Signed)
Requested Prescriptions   Signed Prescriptions Disp Refills  . torsemide (DEMADEX) 20 MG tablet 90 tablet 3    Sig: TAKE ONE TABLET BY MOUTH EVERY DAY    Authorizing Provider: Lorine Bears A    Ordering User: Kendrick Fries

## 2013-03-25 ENCOUNTER — Other Ambulatory Visit: Payer: Self-pay

## 2013-03-25 MED ORDER — FENTANYL 25 MCG/HR TD PT72: 25.0000 ug | MEDICATED_PATCH | TRANSDERMAL | Status: DC

## 2013-03-25 MED ORDER — FENTANYL 12 MCG/HR TD PT72: 12.5000 ug | MEDICATED_PATCH | TRANSDERMAL | Status: DC

## 2013-03-25 NOTE — Telephone Encounter (Signed)
A little early but I looked back and she was usually filling on 13th-15th of the month. So this is okay

## 2013-03-25 NOTE — Telephone Encounter (Signed)
[name] left v/m requesting rx fentanyl patch 12 mcg and 25 mcg. Call when ready for pick up. 

## 2013-03-25 NOTE — Telephone Encounter (Signed)
Spoke with patient's daughter and advised rx ready for pick-up and it will be at the front desk.  

## 2013-04-13 ENCOUNTER — Encounter: Payer: Self-pay | Admitting: *Deleted

## 2013-04-19 ENCOUNTER — Ambulatory Visit (INDEPENDENT_AMBULATORY_CARE_PROVIDER_SITE_OTHER): Payer: Medicare Other | Admitting: Cardiovascular Disease

## 2013-04-19 ENCOUNTER — Other Ambulatory Visit: Payer: Self-pay | Admitting: Cardiovascular Disease

## 2013-04-19 ENCOUNTER — Encounter: Payer: Self-pay | Admitting: Cardiovascular Disease

## 2013-04-19 ENCOUNTER — Other Ambulatory Visit: Payer: Self-pay | Admitting: *Deleted

## 2013-04-19 VITALS — BP 183/107 | HR 78 | Ht 62.0 in | Wt 277.2 lb

## 2013-04-19 DIAGNOSIS — I10 Essential (primary) hypertension: Secondary | ICD-10-CM

## 2013-04-19 DIAGNOSIS — I5032 Chronic diastolic (congestive) heart failure: Secondary | ICD-10-CM

## 2013-04-19 DIAGNOSIS — I890 Lymphedema, not elsewhere classified: Secondary | ICD-10-CM

## 2013-04-19 MED ORDER — LISINOPRIL 40 MG PO TABS: 40.0000 mg | ORAL_TABLET | Freq: Every day | ORAL | Status: DC

## 2013-04-19 MED ORDER — ALISKIREN FUMARATE 300 MG PO TABS: 300.0000 mg | ORAL_TABLET | Freq: Every day | ORAL | Status: DC

## 2013-04-19 MED ORDER — HYDRALAZINE HCL 25 MG PO TABS: 25.0000 mg | ORAL_TABLET | Freq: Three times a day (TID) | ORAL | Status: DC

## 2013-04-19 NOTE — Patient Instructions (Signed)
Continue same medications.   Take an additional 25 mg of Hydralazine if systolic blood pressure is above 160.   Your physician wants you to follow-up in: 6 months.  You will receive a reminder letter in the mail two months in advance. If you don't receive a letter, please call our office to schedule the follow-up appointment.

## 2013-04-19 NOTE — Progress Notes (Signed)
HPI  This is an 79 year old female who is here today for a followup visit. She has a history of HTN, morbid obesity, chronic diastolic heart failure and chronic lower extremity lymphedema. She has had leg swelling bilaterally for > 10 years.  She cannot wear compression stockings (unable to put them on). She has been short of breath after walking about 30 feet chronically. She sleeps with 3 pillows at night, mainly for GERD rather than orthopnea. No chest pain/tightness.  Most recent echocardiogram was in 2011 which showed normal ejection fraction. She has been doing reasonably well. She denies any change in her symptoms. No chest pain. She has been having problems with labile hypotension. She brought blood pressure readings with her. The highest blood pressure recently was 179/91. The lowest reading has been 110/57. Otherwise her symptoms have been stable.   Allergies  Allergen Reactions  . Amoxicillin-Pot Clavulanate   . Cefuroxime Axetil   . Cephalexin     Felt uncomfortable  . Levaquin [Levofloxacin In D5w]   . Sulfonamide Derivatives     unknown     Current Outpatient Prescriptions on File Prior to Visit  Medication Sig Dispense Refill  . aspirin 81 MG tablet Take 81 mg by mouth daily.        . carvedilol (COREG) 25 MG tablet Take 1 tablet (25 mg total) by mouth 2 (two) times daily with a meal.  180 tablet  3  . Casanthranol-Docusate Sodium (LAXATIVE PLUS STOOL SOFTENER) 30-100 MG CAPS Take 1 capsule by mouth 4 (four) times daily as needed.        . cromolyn (NASALCROM) 5.2 MG/ACT nasal spray Place 2 sprays into the nose daily.      . fentaNYL (DURAGESIC - DOSED MCG/HR) 12 MCG/HR Place 1 patch (12.5 mcg total) onto the skin every 3 (three) days. along with the patch  10 patch  0  . fentaNYL (DURAGESIC - DOSED MCG/HR) 25 MCG/HR patch Place 1 patch (25 mcg total) onto the skin every 3 (three) days.  10 patch  0  . hydrALAZINE (APRESOLINE) 25 MG tablet Take 1 tablet (25 mg  total) by mouth 3 (three) times daily.  270 tablet  3  . Liotrix (THYROLAR-1/4) 15 (3.1-12.5) MG (MCG) TABS Take 1 tablet by mouth daily.  30 each  11  . lisinopril (PRINIVIL,ZESTRIL) 40 MG tablet Take 1 tablet (40 mg total) by mouth daily.  90 tablet  3  . Lutein-Zeaxanthin 6-1 MG TABS Take 1 tablet by mouth daily.        . Magnesium 400 MG CAPS Take 400 mg by mouth daily.      . Melatonin 3 MG CAPS Take 1 capsule by mouth daily.        . NONFORMULARY OR COMPOUNDED ITEM SOVEREIGN NANO SILVER LIQUID      . phenazopyridine (PYRIDIUM) 100 MG tablet Take 1 tablet (100 mg total) by mouth 3 (three) times daily as needed.  30 tablet  5  . potassium chloride SA (KLOR-CON M20) 20 MEQ tablet TAKE ONE TABLET BY MOUTH TWICE DAILY  60 tablet  3  . Probiotic Product (ACIDOPHILUS PROBIOTIC BLEND) CAPS Take 1 capsule by mouth daily.        . Simethicone (GAS-X MAXIMUM STRENGTH PO) Take 2 tablets by mouth daily.      Marland Kitchen torsemide (DEMADEX) 20 MG tablet TAKE ONE TABLET BY MOUTH EVERY DAY  90 tablet  3  . Valerian Root 450 MG CAPS Take  1 capsule by mouth daily.        . Vitamin Mixture (CHROMIUM PICOLINATE PO) Take by mouth as directed.         No current facility-administered medications on file prior to visit.     Past Medical History  Diagnosis Date  . HTN (hypertension)     difficult to control  . Hypothyroidism   . OP (osteoporosis)   . Fibromyalgia   . Edema of both legs     chronic  . Degenerative disk disease     cervical  . Follicular lymphoma     Quiescent. Dr. Sunday Corn; Dr. Elenore Rota  . Meningioma     Left frontal  . GERD (gastroesophageal reflux disease)   . Obesity   . Cerebral hemorrhage 2004    hypertensive  . Hx: UTI (urinary tract infection)   . Renal artery aneurysm     with stenosis-right; followed by Dr. Gilda Crease  . Diastolic CHF, chronic     Echo (9-11) with EF 65-70%, mild LVH, mid AI, Mild MR, PA systolic pressure >     Past Surgical History  Procedure Laterality  Date  . Cataract extraction  10/05 and 4/07    Left then Right [facility])  . Abdominal hysterectomy  1958  . Cerebral hemorrhage  10/2002  . Cervical fusion  1985  . Rectocele repair  1982  . Cystocele repair  1982     Family History  Problem Relation Age of Onset  . Stroke Father   . Cancer Mother     Myeloma  . Hypertension      family     History   Social History  . Marital Status: Married    Spouse Name: N/A    Number of Children: N/A  . Years of Education: N/A   Occupational History  . Retired- Scientist, clinical (histocompatibility and immunogenetics) and taught    Social History Main Topics  . Smoking status: Never Smoker   . Smokeless tobacco: Never Used     Comment: Quit smoking 50 years ago  . Alcohol Use: No  . Drug Use: No  . Sexual Activity: Not on file   Other Topics Concern  . Not on file   Social History Narrative   Has living will   Daughter [name] is health care POA   Has DNR order already-- reviewed and form redone 01/29/13   No tube feeds if cognitively unaware     PHYSICAL EXAM   BP 183/107  Pulse 78  Ht 5\' 2"  (1.575 m)  Wt 277 lb 4 oz (125.76 kg)  BMI 50.70 kg/m2  Constitutional: She is oriented to person, place, and time. She appears well-developed and well-nourished. No distress.  HENT: No nasal discharge.  Head: Normocephalic and atraumatic.  Eyes: Pupils are equal and round. Right eye exhibits no discharge. Left eye exhibits no discharge.  Neck: Normal range of motion. Neck supple. No JVD present. No thyromegaly present.  Cardiovascular: Normal rate, regular rhythm, normal heart sounds. Exam reveals no gallop and no friction rub. 2/6 systolic murmur at the base.  Pulmonary/Chest: Effort normal and breath sounds normal. No stridor. No respiratory distress. She has no wheezes. She has no rales. She exhibits no tenderness.  Abdominal: Soft. Bowel sounds are normal. She exhibits no distension. There is no tenderness. There is no rebound and no guarding.  Musculoskeletal:  Normal range of motion. She exhibits chronic edema with positive Stemmer's sign and no tenderness.  Neurological: She is alert and oriented to  person, place, and time. Coordination normal.  Skin: Skin is warm and dry. No rash noted. She is not diaphoretic. No erythema. No pallor.  Psychiatric: She has a normal mood and affect. Her behavior is normal. Judgment and thought content normal.      ABNORMAL     ASSESSMENT AND PLAN

## 2013-04-19 NOTE — Telephone Encounter (Signed)
Requested Prescriptions   Signed Prescriptions Disp Refills  . aliskiren (TEKTURNA) 300 MG tablet 30 tablet 6    Sig: Take 1 tablet (300 mg total) by mouth daily.    Authorizing Provider: Lorine Bears A    Ordering User: Kendrick Fries

## 2013-04-20 DIAGNOSIS — I890 Lymphedema, not elsewhere classified: Secondary | ICD-10-CM | POA: Insufficient documentation

## 2013-04-20 NOTE — Assessment & Plan Note (Signed)
Continue small dose torsemide. Lower extremity edema seems to be due to lymphedema.

## 2013-04-20 NOTE — Assessment & Plan Note (Signed)
We again discussed the importance of leg elevation during the day. The patient does have graded compression device at home and is unable to use it due to difficulty in applying it

## 2013-04-20 NOTE — Assessment & Plan Note (Signed)
The patient has labile hypertension on multiple blood pressure medications. I recommend that she takes an extra dose of hydralazine if systolic blood pressure is about 160. Otherwise continue treatment with [place], carvedilol, hydralazine and lisinopril. We'll avoid calcium channel blockers due to lower extremity edema. Recommend basic metabolic profile with next visit.

## 2013-05-05 ENCOUNTER — Other Ambulatory Visit: Payer: Self-pay | Admitting: *Deleted

## 2013-05-05 MED ORDER — FENTANYL 25 MCG/HR TD PT72: 25.0000 ug | MEDICATED_PATCH | TRANSDERMAL | Status: DC

## 2013-05-05 MED ORDER — FENTANYL 12 MCG/HR TD PT72: 12.5000 ug | MEDICATED_PATCH | TRANSDERMAL | Status: DC

## 2013-05-05 NOTE — Telephone Encounter (Signed)
Spoke with patient and advised rx ready for pick-up and it will be at the front desk.  

## 2013-05-17 ENCOUNTER — Telehealth: Payer: Self-pay | Admitting: *Deleted

## 2013-05-17 NOTE — Telephone Encounter (Signed)
Check with Izora Gala about this. During last visit with me, the patient seemed competent.

## 2013-05-17 NOTE — Telephone Encounter (Signed)
Spoke w/ pt's daughter and POA, Kayla [name].  She requests an appt for herself to see Dr. Fletcher Anon to discuss pt care and prognosis, but does not want pt to be present for this appt.  She reports that if pt is present, "she will lie" and won't be able to talk about everything that needs to be talked about. Explained to [place] that I am not familiar w/ a caregiver requesting an appt w/o pt being present.  Advised that pt has certain rights, one of which is for the pt to participate in their own care and that we are the pt's advocate. She states that she would really like to sit down w/ Dr. Fletcher Anon to talk about pt and what to expect, but repeatedly states that pt will interfere. Advised her that I could ask Dr. Fletcher Anon or his nurse to call her back and discuss, but she is quite firm that she speak w/ Dr. Fletcher Anon fact to face, does not want pt present, and can bring in paperwork that she is POA. Advised her that I would speak w/ office manager about how to handle this.

## 2013-05-26 NOTE — Telephone Encounter (Signed)
Spoke with patients daughter. Informed her that would not discuss her mother without permission from her. Patient asked for general information about heart failure. General information was given. Patient asks about Her mothers pain worsening. I reminded her that I could not discuss her mothers details and that questions about pain should be referred to a primary MD. Patient verbalized understanding.

## 2013-06-04 ENCOUNTER — Other Ambulatory Visit: Payer: Self-pay | Admitting: *Deleted

## 2013-06-04 NOTE — Telephone Encounter (Signed)
Dr. Silvio Pate is out of the office all week and i will be in [place] on Monday. Please give to your CMA to call the pt for pick-up

## 2013-06-06 MED ORDER — FENTANYL 12 MCG/HR TD PT72: 12.5000 ug | MEDICATED_PATCH | TRANSDERMAL | Status: DC

## 2013-06-06 MED ORDER — FENTANYL 25 MCG/HR TD PT72: 25.0000 ug | MEDICATED_PATCH | TRANSDERMAL | Status: DC

## 2013-06-06 NOTE — Telephone Encounter (Signed)
Printed. Please give to patient.  Thanks.  

## 2013-06-07 NOTE — Telephone Encounter (Signed)
Message left on cell phone as instructed that script is up front ready for pickup.

## 2013-07-05 ENCOUNTER — Other Ambulatory Visit: Payer: Self-pay

## 2013-07-05 MED ORDER — FENTANYL 12 MCG/HR TD PT72: 12.5000 ug | MEDICATED_PATCH | TRANSDERMAL | Status: DC

## 2013-07-05 MED ORDER — FENTANYL 25 MCG/HR TD PT72: 25.0000 ug | MEDICATED_PATCH | TRANSDERMAL | Status: DC

## 2013-07-05 NOTE — Telephone Encounter (Signed)
Left message on machine that rx is ready for pick-up, and it will be at our front desk.  

## 2013-07-05 NOTE — Telephone Encounter (Signed)
[name] left v/m requesting rx fentanyl patch 12 mcg and 25 mcg. Call when ready for pick up.

## 2013-07-14 ENCOUNTER — Telehealth: Payer: Self-pay

## 2013-07-14 NOTE — Telephone Encounter (Signed)
Kayla [name] said phenazopyridine cost $5/ pill and request a less expensive substitute med sent to [facility].pt has problems with incontinence. Dr Silvio Pate not on computer.Please advise.

## 2013-07-15 NOTE — Telephone Encounter (Signed)
That is actually a medicine for urinary pain/burning on urination that usually occurs with a uti or a condition like interstitial cystitis - it makes urine turn a different color and helps with the burning --- I have not used it to treat incontinence Do you think you have an infection ? It is also generic so generally it does not cost that much - I am surprised at your cost  I'm also not aware of substitutions for that - there was a drug on the market a while back that was similar and they stopped making it  Do you have a urologist you see that you can ask?

## 2013-07-16 NOTE — Telephone Encounter (Signed)
Left voicemail requesting [name] to call office

## 2013-07-20 NOTE — Telephone Encounter (Signed)
Spoke with daughter and she would like to know what Dr. Silvio Pate thought? Daughter states pt only takes this medication when needed and is there a substitute?

## 2013-07-21 NOTE — Telephone Encounter (Signed)
I agree that the med is just for pain, not for incontinence.  It is important to drink plenty of fluids, and go to the bathroom to empty her bladder regularly (before an urge occurs)---like every 2 hours or less. meds for incontinence have a fairly high side effect burden. I am not excited about adding more meds for her

## 2013-07-21 NOTE — Telephone Encounter (Signed)
Left message on daughter's VM with results, advised her to call if any further questions

## 2013-07-29 ENCOUNTER — Encounter: Payer: Self-pay | Admitting: Radiology

## 2013-07-30 ENCOUNTER — Ambulatory Visit (INDEPENDENT_AMBULATORY_CARE_PROVIDER_SITE_OTHER): Payer: Medicare Other | Admitting: Internal Medicine

## 2013-07-30 ENCOUNTER — Encounter: Payer: Self-pay | Admitting: Internal Medicine

## 2013-07-30 VITALS — BP 160/80 | HR 75 | Temp 98.1°F | Wt 278.0 lb

## 2013-07-30 DIAGNOSIS — L723 Sebaceous cyst: Secondary | ICD-10-CM

## 2013-07-30 DIAGNOSIS — L02219 Cutaneous abscess of trunk, unspecified: Secondary | ICD-10-CM

## 2013-07-30 DIAGNOSIS — L089 Local infection of the skin and subcutaneous tissue, unspecified: Secondary | ICD-10-CM

## 2013-07-30 DIAGNOSIS — L03312 Cellulitis of back [any part except buttock]: Secondary | ICD-10-CM

## 2013-07-30 DIAGNOSIS — L03319 Cellulitis of trunk, unspecified: Secondary | ICD-10-CM

## 2013-07-30 MED ORDER — CLINDAMYCIN HCL 300 MG PO CAPS: 300.0000 mg | ORAL_CAPSULE | Freq: Three times a day (TID) | ORAL | Status: DC

## 2013-07-30 NOTE — Progress Notes (Signed)
Subjective:    Patient ID: Kayla [name], female    DOB: 25-May-1923, 80 y.o.   MRN: [id number]  HPI Here with daughter Kayla [name]  Noted boil on upper back about 2 weeks ago Had prior pain but she thought it was deep and bone pain Sharp pain if she leans on it  Tried vibrator on it--got it more red Prior episode where it got bigger (has been there for many years)  Has drained some--oozing  Current Outpatient Prescriptions on File Prior to Visit  Medication Sig Dispense Refill  . aliskiren (TEKTURNA) 300 MG tablet Take 1 tablet (300 mg total) by mouth daily.  30 tablet  6  . aspirin 81 MG tablet Take 81 mg by mouth daily.        Sarajane Marek Sodium (LAXATIVE PLUS STOOL SOFTENER) 30-100 MG CAPS Take 1 capsule by mouth 4 (four) times daily as needed.       . cromolyn (NASALCROM) 5.2 MG/ACT nasal spray Place 2 sprays into the nose daily.      . fentaNYL (DURAGESIC - DOSED MCG/HR) 12 MCG/HR Place 1 patch (12.5 mcg total) onto the skin every 3 (three) days. along with the 66mcg patch  10 patch  0  . fentaNYL (DURAGESIC - DOSED MCG/HR) 25 MCG/HR patch Place 1 patch (25 mcg total) onto the skin every 3 (three) days.  10 patch  0  . hydrALAZINE (APRESOLINE) 25 MG tablet Take 1 tablet (25 mg total) by mouth 3 (three) times daily.  100 tablet  6  . Liotrix (THYROLAR-1/4) 15 (3.1-12.5) MG (MCG) TABS Take 1 tablet by mouth daily.  30 each  11  . lisinopril (PRINIVIL,ZESTRIL) 40 MG tablet Take 1 tablet (40 mg total) by mouth daily.  90 tablet  3  . Lutein-Zeaxanthin 6-1 MG TABS Take 1 tablet by mouth daily.        . Magnesium 400 MG CAPS Take 400 mg by mouth daily.      . Melatonin 3 MG CAPS Take 1 capsule by mouth daily.        . NONFORMULARY OR COMPOUNDED ITEM SOVEREIGN NANO SILVER LIQUID      . phenazopyridine (PYRIDIUM) 100 MG tablet Take 1 tablet (100 mg total) by mouth 3 (three) times daily as needed.  30 tablet  5  . potassium chloride SA (KLOR-CON M20) 20 MEQ tablet TAKE ONE TABLET  BY MOUTH TWICE DAILY  60 tablet  3  . Probiotic Product (ACIDOPHILUS PROBIOTIC BLEND) CAPS Take 1 capsule by mouth daily.        . Simethicone (GAS-X MAXIMUM STRENGTH PO) Take 2 tablets by mouth daily.      Marland Kitchen torsemide (DEMADEX) 20 MG tablet TAKE ONE TABLET BY MOUTH EVERY DAY  90 tablet  3  . Vitamin Mixture (CHROMIUM PICOLINATE PO) Take by mouth as directed.         No current facility-administered medications on file prior to visit.    Allergies  Allergen Reactions  . Amoxicillin-Pot Clavulanate   . Cefuroxime Axetil   . Cephalexin     Felt uncomfortable  . Levaquin [Levofloxacin In D5w]   . Sulfonamide Derivatives     unknown    Past Medical History  Diagnosis Date  . HTN (hypertension)     difficult to control  . Hypothyroidism   . OP (osteoporosis)   . Fibromyalgia   . Edema of both legs     chronic  . Degenerative disk disease  cervical  . Follicular lymphoma     Quiescent. Dr. Fransisco Beau; Dr. Kathyrn Sheriff  . Meningioma     Left frontal  . GERD (gastroesophageal reflux disease)   . Obesity   . Cerebral hemorrhage 2004    hypertensive  . Hx: UTI (urinary tract infection)   . Renal artery aneurysm     with stenosis-right; followed by Dr. Delana Meyer  . Diastolic CHF, chronic     Echo (9-11) with EF 65-70%, mild LVH, mid AI, Mild MR, PA systolic pressure > 20SHNG    Past Surgical History  Procedure Laterality Date  . Cataract extraction  10/05 and 4/07    Left then Right [facility])  . Abdominal hysterectomy  1958  . Cerebral hemorrhage  10/2002  . Cervical fusion  1985  . Rectocele repair  1982  . Cystocele repair  1982    Family History  Problem Relation Age of Onset  . Stroke Father   . Cancer Mother     Myeloma  . Hypertension      family    History   Social History  . Marital Status: Married    Spouse Name: N/A    Number of Children: N/A  . Years of Education: N/A   Occupational History  . Retired- Marine scientist and taught    Social History Main  Topics  . Smoking status: Never Smoker   . Smokeless tobacco: Never Used     Comment: Quit smoking 50 years ago  . Alcohol Use: No  . Drug Use: No  . Sexual Activity: Not on file   Other Topics Concern  . Not on file   Social History Narrative   Has living will   Daughter Kayla [name] is health care POA   Has DNR order already-- reviewed and form redone 01/29/13   No tube feeds if cognitively unaware   Review of Systems No fever Some fullness and irritation in posterior neck     Objective:   Physical Exam  Constitutional: She appears well-developed and well-nourished. No distress.  Skin:  Large inflamed cyst along thoracic back--just to right of spine ~T5-6 6cm or more Very tender Redness and warmth spread from the lesion          Assessment & Plan:

## 2013-07-30 NOTE — Assessment & Plan Note (Signed)
Sterile prep Ethyl chloride then 3cc 2% plain lido Horizontal incision--finally through very inflamed cyst wall ~20cc blood and frank pus expressed Packed and covered with sterile gauze Daughter instructed on home care

## 2013-07-30 NOTE — Assessment & Plan Note (Signed)
Will treat with clinda

## 2013-07-30 NOTE — Progress Notes (Signed)
Pre visit review using our clinic review tool, if applicable. No additional management support is needed unless otherwise documented below in the visit note. 

## 2013-08-04 ENCOUNTER — Telehealth: Payer: Self-pay | Admitting: Internal Medicine

## 2013-08-04 NOTE — Telephone Encounter (Signed)
Left detailed message on VM with results, advised daughter to call if any questions

## 2013-08-04 NOTE — Telephone Encounter (Signed)
She should just keep it covered till it is closed up---in case it leaks a little from time to time Make sure the inflammation and pain are better

## 2013-08-04 NOTE — Telephone Encounter (Signed)
Daughter Alleya Demeter called and wanted to get clarification on the wound on pt's back.  She has completed daily bandage changes for her mother and did give Faron a bath last night 08/03/13.  Clair's question is, does she continue with the daily bandage changes until patient comes in on 08/12/13?  Best number to call daughter is [phone]

## 2013-08-12 ENCOUNTER — Encounter: Payer: Self-pay | Admitting: Internal Medicine

## 2013-08-12 ENCOUNTER — Ambulatory Visit (INDEPENDENT_AMBULATORY_CARE_PROVIDER_SITE_OTHER): Payer: Medicare Other | Admitting: Internal Medicine

## 2013-08-12 VITALS — BP 150/80 | HR 78 | Temp 97.8°F | Ht 62.0 in | Wt 263.0 lb

## 2013-08-12 DIAGNOSIS — Z0000 Encounter for general adult medical examination without abnormal findings: Secondary | ICD-10-CM

## 2013-08-12 DIAGNOSIS — I890 Lymphedema, not elsewhere classified: Secondary | ICD-10-CM

## 2013-08-12 DIAGNOSIS — I10 Essential (primary) hypertension: Secondary | ICD-10-CM

## 2013-08-12 DIAGNOSIS — I5032 Chronic diastolic (congestive) heart failure: Secondary | ICD-10-CM

## 2013-08-12 DIAGNOSIS — M5030 Other cervical disc degeneration, unspecified cervical region: Secondary | ICD-10-CM

## 2013-08-12 MED ORDER — FENTANYL 12 MCG/HR TD PT72: 12.5000 ug | MEDICATED_PATCH | TRANSDERMAL | Status: DC

## 2013-08-12 MED ORDER — FENTANYL 25 MCG/HR TD PT72: 25.0000 ug | MEDICATED_PATCH | TRANSDERMAL | Status: DC

## 2013-08-12 MED ORDER — PHENAZOPYRIDINE HCL 100 MG PO TABS: 100.0000 mg | ORAL_TABLET | Freq: Three times a day (TID) | ORAL | Status: DC | PRN

## 2013-08-12 NOTE — Assessment & Plan Note (Signed)
Does okay on current pain meds

## 2013-08-12 NOTE — Assessment & Plan Note (Signed)
Stable Weight down today  No changes  Continues with cardiology

## 2013-08-12 NOTE — Progress Notes (Signed)
Pre visit review using our clinic review tool, if applicable. No additional management support is needed unless otherwise documented below in the visit note. 

## 2013-08-12 NOTE — Progress Notes (Signed)
Subjective:    Patient ID: Kayla [name], female    DOB: 01/19/1924, 80 y.o.   MRN: [id number]  HPI Here with daughter Here for physical Discussed immunizations---she wants to think about prevnar and zostavax--she isn't sure No cancer screening due to age  Still having some trouble with the cyst Drains yellow discharge still Feels like a knot in there still Also has lump on back of neck she wants checked  Has aide to help with baths twice a week Does the rest of her personal care Daughter handles instrumental ADLs---cooking, etc Husband still drives--daughter usually goes along though  Feels her breathing is affected by the pollen Some runny eyes and nose Better inside  No chest pain No palpitations Still with significant lymphedema Weight down 13#--- wasn't aware of it  Current Outpatient Prescriptions on File Prior to Visit  Medication Sig Dispense Refill  . aliskiren (TEKTURNA) 300 MG tablet Take 1 tablet (300 mg total) by mouth daily.  30 tablet  6  . aspirin 81 MG tablet Take 81 mg by mouth daily.        . carvedilol (COREG) 25 MG tablet Take 25 mg by mouth daily.      Sarajane Marek Sodium (LAXATIVE PLUS STOOL SOFTENER) 30-100 MG CAPS Take 1 capsule by mouth 4 (four) times daily as needed.       . clindamycin (CLEOCIN) 300 MG capsule Take 1 capsule (300 mg total) by mouth 3 (three) times daily.  30 capsule  0  . cromolyn (NASALCROM) 5.2 MG/ACT nasal spray Place 2 sprays into the nose daily.      . hydrALAZINE (APRESOLINE) 25 MG tablet Take 1 tablet (25 mg total) by mouth 3 (three) times daily.  100 tablet  6  . Liotrix (THYROLAR-1/4) 15 (3.1-12.5) MG (MCG) TABS Take 1 tablet by mouth daily.  30 each  11  . lisinopril (PRINIVIL,ZESTRIL) 40 MG tablet Take 1 tablet (40 mg total) by mouth daily.  90 tablet  3  . Lutein-Zeaxanthin 6-1 MG TABS Take 1 tablet by mouth daily.        . Magnesium 400 MG CAPS Take 400 mg by mouth daily.      . Melatonin 3 MG CAPS Take 1  capsule by mouth daily.        . NONFORMULARY OR COMPOUNDED ITEM SOVEREIGN NANO SILVER LIQUID      . potassium chloride SA (KLOR-CON M20) 20 MEQ tablet TAKE ONE TABLET BY MOUTH TWICE DAILY  60 tablet  3  . Probiotic Product (ACIDOPHILUS PROBIOTIC BLEND) CAPS Take 1 capsule by mouth daily.        . Simethicone (GAS-X MAXIMUM STRENGTH PO) Take 2 tablets by mouth daily.      Marland Kitchen torsemide (DEMADEX) 20 MG tablet TAKE ONE TABLET BY MOUTH EVERY DAY  90 tablet  3  . Vitamin Mixture (CHROMIUM PICOLINATE PO) Take by mouth as directed.         No current facility-administered medications on file prior to visit.    Allergies  Allergen Reactions  . Amoxicillin-Pot Clavulanate   . Cefuroxime Axetil   . Cephalexin     Felt uncomfortable  . Levaquin [Levofloxacin In D5w]   . Sulfonamide Derivatives     unknown    Past Medical History  Diagnosis Date  . HTN (hypertension)     difficult to control  . Hypothyroidism   . OP (osteoporosis)   . Fibromyalgia   . Edema of both legs  chronic  . Degenerative disk disease     cervical  . Follicular lymphoma     Quiescent. Dr. Fransisco Beau; Dr. Kathyrn Sheriff  . Meningioma     Left frontal  . GERD (gastroesophageal reflux disease)   . Obesity   . Cerebral hemorrhage 2004    hypertensive  . Hx: UTI (urinary tract infection)   . Renal artery aneurysm     with stenosis-right; followed by Dr. Delana Meyer  . Diastolic CHF, chronic     Echo (9-11) with EF 65-70%, mild LVH, mid AI, Mild MR, PA systolic pressure > 42VZDG    Past Surgical History  Procedure Laterality Date  . Cataract extraction  10/05 and 4/07    Left then Right [facility])  . Abdominal hysterectomy  1958  . Cerebral hemorrhage  10/2002  . Cervical fusion  1985  . Rectocele repair  1982  . Cystocele repair  1982    Family History  Problem Relation Age of Onset  . Stroke Father   . Cancer Mother     Myeloma  . Hypertension      family    History   Social History  . Marital  Status: Married    Spouse Name: N/A    Number of Children: N/A  . Years of Education: N/A   Occupational History  . Retired- Marine scientist and taught    Social History Main Topics  . Smoking status: Never Smoker   . Smokeless tobacco: Never Used     Comment: Quit smoking 50 years ago  . Alcohol Use: No  . Drug Use: No  . Sexual Activity: Not on file   Other Topics Concern  . Not on file   Social History Narrative   Has living will   Daughter [name] is health care POA   Has DNR order already-- reviewed and form redone 01/29/13   No tube feeds if cognitively unaware   Review of Systems  Constitutional: Negative for unexpected weight change.       Wears seat belt  HENT: Positive for hearing loss and tinnitus.        Upper dentures Can't wear the lowers  Eyes: Negative for visual disturbance.       No diplopia or unilateral vision loss  Respiratory: Positive for cough and shortness of breath. Negative for chest tightness.        Some cough-- sometimes when eating  Cardiovascular: Positive for leg swelling. Negative for chest pain and palpitations.  Gastrointestinal: Negative for nausea, vomiting and blood in stool.       Uses simethicone and enzymes for indigestion  Genitourinary: Negative for dysuria and hematuria.       Ongoing incontinence--- uses 4 pads   Musculoskeletal: Positive for arthralgias and back pain.  Skin: Negative for rash.       Occasional rough spots on elbows---from rubbing on chair Chronic stasis changes in calves also  Allergic/Immunologic: Positive for environmental allergies. Negative for immunocompromised state.  Neurological: Positive for headaches. Negative for dizziness, syncope and light-headedness.       Headaches at times---related to neck problems  Psychiatric/Behavioral: Positive for dysphoric mood.       Sleeps in recliner--relates this to neck problems No overt depression--- ready to go anytime though       Objective:   Physical Exam    Constitutional: She is oriented to person, place, and time. She appears well-developed. No distress.  HENT:  Head: Normocephalic.  Mouth/Throat: Oropharynx is clear and moist. No  oropharyngeal exudate.  Eyes: Conjunctivae and EOM are normal. Pupils are equal, round, and reactive to light.  Neck: Normal range of motion. No thyromegaly present.  Cardiovascular: Normal rate, regular rhythm, normal heart sounds and intact distal pulses.  Exam reveals no gallop.   No murmur heard. Pulmonary/Chest: Effort normal and breath sounds normal. No respiratory distress. She has no wheezes. She has no rales.  Abdominal: Soft. There is no tenderness.  Musculoskeletal: She exhibits edema. She exhibits no tenderness.  3-4+ edema in calves  Lymphadenopathy:    She has no cervical adenopathy.  Neurological: She is alert and oriented to person, place, and time.  Skin: Rash noted.  Nodular stasis changes  Cyst on back almost completely down. Very slight yellowish drainage and inflammation pretty much gone  Psychiatric: She has a normal mood and affect. Her behavior is normal.          Assessment & Plan:

## 2013-08-12 NOTE — Assessment & Plan Note (Signed)
Doing well Mild dependence for ADLs Stable status No cancer screening due to age Prefers to hold off on immunizations

## 2013-08-12 NOTE — Assessment & Plan Note (Signed)
stable °

## 2013-08-12 NOTE — Assessment & Plan Note (Signed)
BP Readings from Last 3 Encounters:  08/12/13 150/80  07/30/13 160/80  04/19/13 183/107   Okay for her No changes

## 2013-08-25 ENCOUNTER — Telehealth: Payer: Self-pay | Admitting: *Deleted

## 2013-08-25 NOTE — Telephone Encounter (Signed)
That may be normal, though usually it would close up over this long a time. If it is not warm or tender, we can probably still just wait (not much else to do unless it gets infected again)

## 2013-08-25 NOTE — Telephone Encounter (Signed)
Spoke with daughter and advised results.  

## 2013-08-25 NOTE — Telephone Encounter (Signed)
Per daughter her mother wound is still oozing just a little and the tissue around the outside is turning dark is that normal? Per daughter?

## 2013-09-20 ENCOUNTER — Other Ambulatory Visit: Payer: Self-pay | Admitting: *Deleted

## 2013-09-21 MED ORDER — FENTANYL 25 MCG/HR TD PT72: 25.0000 ug | MEDICATED_PATCH | TRANSDERMAL | Status: DC

## 2013-09-21 MED ORDER — FENTANYL 12 MCG/HR TD PT72: 12.5000 ug | MEDICATED_PATCH | TRANSDERMAL | Status: DC

## 2013-09-21 NOTE — Telephone Encounter (Signed)
Left message on machine that rx is ready for pick-up, and it will be at our front desk.  

## 2013-10-22 ENCOUNTER — Other Ambulatory Visit: Payer: Self-pay

## 2013-10-22 MED ORDER — FENTANYL 12 MCG/HR TD PT72: 12.5000 ug | MEDICATED_PATCH | TRANSDERMAL | Status: DC

## 2013-10-22 MED ORDER — FENTANYL 25 MCG/HR TD PT72: 25.0000 ug | MEDICATED_PATCH | TRANSDERMAL | Status: DC

## 2013-10-22 NOTE — Telephone Encounter (Signed)
Kayla [name] left v/m requesting fentanyl patch 12 mcg and 25 mcg.call when ready for pick up.

## 2013-10-22 NOTE — Telephone Encounter (Signed)
Spoke with patient and advised rx ready for pick-up and it will be at the front desk.  

## 2013-11-08 NOTE — Telephone Encounter (Signed)
This encounter was created in error - please disregard.

## 2013-11-26 ENCOUNTER — Other Ambulatory Visit: Payer: Self-pay | Admitting: Family Medicine

## 2013-11-26 MED ORDER — FENTANYL 25 MCG/HR TD PT72: 25.0000 ug | MEDICATED_PATCH | TRANSDERMAL | Status: DC

## 2013-11-26 MED ORDER — FENTANYL 12 MCG/HR TD PT72: 12.5000 ug | MEDICATED_PATCH | TRANSDERMAL | Status: DC

## 2013-11-26 NOTE — Telephone Encounter (Signed)
Spoke with patient and advised rx ready for pick-up and it will be at the front desk.  

## 2013-11-26 NOTE — Telephone Encounter (Signed)
[place] called on behalf of pt to request refills on Fentanyl patches.  Last filled 10/22/13.

## 2013-11-28 ENCOUNTER — Other Ambulatory Visit: Payer: Self-pay | Admitting: Cardiovascular Disease

## 2013-12-06 ENCOUNTER — Other Ambulatory Visit: Payer: Self-pay | Admitting: *Deleted

## 2013-12-06 MED ORDER — POTASSIUM CHLORIDE CRYS ER 20 MEQ PO TBCR: EXTENDED_RELEASE_TABLET | ORAL | Status: DC

## 2013-12-13 ENCOUNTER — Ambulatory Visit (INDEPENDENT_AMBULATORY_CARE_PROVIDER_SITE_OTHER): Payer: Medicare Other | Admitting: Cardiovascular Disease

## 2013-12-13 ENCOUNTER — Encounter: Payer: Self-pay | Admitting: Cardiovascular Disease

## 2013-12-13 VITALS — BP 196/102 | HR 67 | Ht 63.0 in | Wt 279.0 lb

## 2013-12-13 DIAGNOSIS — I10 Essential (primary) hypertension: Secondary | ICD-10-CM

## 2013-12-13 DIAGNOSIS — R0602 Shortness of breath: Secondary | ICD-10-CM

## 2013-12-13 DIAGNOSIS — I5032 Chronic diastolic (congestive) heart failure: Secondary | ICD-10-CM

## 2013-12-13 DIAGNOSIS — I872 Venous insufficiency (chronic) (peripheral): Secondary | ICD-10-CM

## 2013-12-13 NOTE — Progress Notes (Signed)
HPI  This is an 80 year old female who is here today for a followup visit. She has a history of HTN, morbid obesity, chronic diastolic heart failure and chronic lower extremity lymphedema. She has had leg swelling bilaterally for > 10 years.  She cannot wear compression stockings (unable to put them on). She has been short of breath after walking about 30 feet chronically. She sleeps with 3 pillows at night, mainly for GERD rather than orthopnea. No chest pain/tightness.  Most recent echocardiogram was in 2011 which showed normal ejection fraction. She has been doing reasonably well. She denies any change in her symptoms. No chest pain. She continues to have problems with labile hypotension. I reviewed her home blood pressure readings from the last few days. Blood pressure this morning was 127/65 although today in clinic is 196/102. She gets stressed out every time she goes to a physician's office. Blood pressure yesterday was 170/95. She takes an extra dose of hydralazine if systolic blood pressure is above 160.  Allergies  Allergen Reactions  . Amoxicillin-Pot Clavulanate   . Cefuroxime Axetil   . Cephalexin     Felt uncomfortable  . Levaquin [Levofloxacin In D5w]   . Sulfonamide Derivatives     unknown     Current Outpatient Prescriptions on File Prior to Visit  Medication Sig Dispense Refill  . aspirin 81 MG tablet Take 81 mg by mouth daily.        . carvedilol (COREG) 25 MG tablet Take 25 mg by mouth daily.      Sarajane Marek Sodium (LAXATIVE PLUS STOOL SOFTENER) 30-100 MG CAPS Take 1 capsule by mouth 4 (four) times daily as needed.       . cromolyn (NASALCROM) 5.2 MG/ACT nasal spray Place 2 sprays into the nose daily.      . fentaNYL (DURAGESIC - DOSED MCG/HR) 12 MCG/HR Place 1 patch (12.5 mcg total) onto the skin every 3 (three) days. along with the 67mcg patch  10 patch  0  . fentaNYL (DURAGESIC - DOSED MCG/HR) 25 MCG/HR patch Place 1 patch (25 mcg total) onto the  skin every 3 (three) days.  10 patch  0  . hydrALAZINE (APRESOLINE) 25 MG tablet Take 1 tablet (25 mg total) by mouth 3 (three) times daily.  100 tablet  6  . Liotrix (THYROLAR-1/4) 15 (3.1-12.5) MG (MCG) TABS Take 1 tablet by mouth daily.  30 each  11  . lisinopril (PRINIVIL,ZESTRIL) 40 MG tablet Take 1 tablet (40 mg total) by mouth daily.  90 tablet  3  . Lutein-Zeaxanthin 6-1 MG TABS Take 1 tablet by mouth daily.        . Magnesium 400 MG CAPS Take 400 mg by mouth daily.      . Melatonin 3 MG CAPS Take 1 capsule by mouth daily.        . NONFORMULARY OR COMPOUNDED ITEM SOVEREIGN NANO SILVER LIQUID      . phenazopyridine (PYRIDIUM) 100 MG tablet Take 1 tablet (100 mg total) by mouth 3 (three) times daily as needed.  30 tablet  5  . potassium chloride SA (KLOR-CON M20) 20 MEQ tablet TAKE ONE TABLET BY MOUTH TWICE DAILY  60 tablet  3  . Probiotic Product (ACIDOPHILUS PROBIOTIC BLEND) CAPS Take 1 capsule by mouth daily.        . Simethicone (GAS-X MAXIMUM STRENGTH PO) Take 2 tablets by mouth daily.      . TEKTURNA 300 MG tablet TAKE ONE TABLET BY  MOUTH ONCE DAILY  30 tablet  1  . torsemide (DEMADEX) 20 MG tablet TAKE ONE TABLET BY MOUTH EVERY DAY  90 tablet  3  . Vitamin Mixture (CHROMIUM PICOLINATE PO) Take by mouth as directed.         No current facility-administered medications on file prior to visit.     Past Medical History  Diagnosis Date  . HTN (hypertension)     difficult to control  . Hypothyroidism   . OP (osteoporosis)   . Fibromyalgia   . Edema of both legs     chronic  . Degenerative disk disease     cervical  . Follicular lymphoma     Quiescent. Dr. Fransisco Beau; Dr. Kathyrn Sheriff  . Meningioma     Left frontal  . GERD (gastroesophageal reflux disease)   . Obesity   . Cerebral hemorrhage 2004    hypertensive  . Hx: UTI (urinary tract infection)   . Renal artery aneurysm     with stenosis-right; followed by Dr. Delana Meyer  . Diastolic CHF, chronic     Echo (9-11) with EF  65-70%, mild LVH, mid AI, Mild MR, PA systolic pressure > 08MVHQ     Past Surgical History  Procedure Laterality Date  . Cataract extraction  10/05 and 4/07    Left then Right [facility])  . Abdominal hysterectomy  1958  . Cerebral hemorrhage  10/2002  . Cervical fusion  1985  . Rectocele repair  1982  . Cystocele repair  1982     Family History  Problem Relation Age of Onset  . Stroke Father   . Cancer Mother     Myeloma  . Hypertension      family     History   Social History  . Marital Status: Married    Spouse Name: N/A    Number of Children: N/A  . Years of Education: N/A   Occupational History  . Retired- Marine scientist and taught    Social History Main Topics  . Smoking status: Never Smoker   . Smokeless tobacco: Never Used     Comment: Quit smoking 50 years ago  . Alcohol Use: No  . Drug Use: No  . Sexual Activity: Not on file   Other Topics Concern  . Not on file   Social History Narrative   Has living will   Daughter [name] is health care POA   Has DNR order already-- reviewed and form redone 01/29/13   No tube feeds if cognitively unaware     PHYSICAL EXAM   BP 196/102  Pulse 67  Ht 5\' 3"  (1.6 m)  Wt 279 lb (126.554 kg)  BMI 49.44 kg/m2  Constitutional: She is oriented to person, place, and time. She appears well-developed and well-nourished. No distress.  HENT: No nasal discharge.  Head: Normocephalic and atraumatic.  Eyes: Pupils are equal and round. Right eye exhibits no discharge. Left eye exhibits no discharge.  Neck: Normal range of motion. Neck supple. No JVD present. No thyromegaly present.  Cardiovascular: Normal rate, regular rhythm, normal heart sounds. Exam reveals no gallop and no friction rub. 2/6 systolic murmur at the base.  Pulmonary/Chest: Effort normal and breath sounds normal. No stridor. No respiratory distress. She has no wheezes. She has no rales. She exhibits no tenderness.  Abdominal: Soft. Bowel sounds are normal.  She exhibits no distension. There is no tenderness. There is no rebound and no guarding.  Musculoskeletal: Normal range of motion. She exhibits chronic edema with  positive Stemmer's sign and no tenderness.  Neurological: She is alert and oriented to person, place, and time. Coordination normal.  Skin: Skin is warm and dry. No rash noted. She is not diaphoretic. No erythema. No pallor.  Psychiatric: She has a normal mood and affect. Her behavior is normal. Judgment and thought content normal.      JOI:TGPQD  Rhythm  -First degree A-V block  PRi = 246 -Left axis -anterior fascicular block.   -Old inferior infarct  -Poor R-wave progression -may be secondary to left ventricular hypertrophy   consider old anterior infarct.   ABNORMAL     ASSESSMENT AND PLAN

## 2013-12-13 NOTE — Assessment & Plan Note (Signed)
Blood pressure is elevated today. She tends to have labile hypertension. She takes an extra dose of hydralazine if systolic blood pressure is above 160.

## 2013-12-13 NOTE — Patient Instructions (Signed)
Continue same medications     Your physician wants you to follow-up in: 6  Months. You will receive a reminder letter in the mail two months in advance. If you don't receive a letter, please call our office to schedule the follow-up appointment.   

## 2013-12-13 NOTE — Assessment & Plan Note (Signed)
She seems to be euvolemic and stable from a cardiac standpoint. Continue current dose of torsemide 20 mg once daily.

## 2013-12-13 NOTE — Assessment & Plan Note (Signed)
With lymphedema. We again discussed the importance of leg elevation during the day. The patient does have graded compression device at home and is unable to use it due to difficulty in applying it

## 2013-12-17 ENCOUNTER — Telehealth: Payer: Self-pay | Admitting: *Deleted

## 2013-12-17 NOTE — Telephone Encounter (Signed)
Daughter calling asking if insurance would help pay for her mother a chair, not a lift chair but a chair that would automatically lift her legs up like a recliner but motorized. I asked if such a chair exists and she states her friend has a sectional and it was built into the chair. Per daughter her mother can't pull any levers to lift her own legs. Please advise

## 2013-12-17 NOTE — Telephone Encounter (Signed)
Spoke with daughter and advised results.  

## 2013-12-17 NOTE — Telephone Encounter (Signed)
I think that would be a lift chair--they lift people up towards standing to facilitate getting out of chair but also lift the legs up and recline. I don't think Medicare will cover these (I have not written for one of these in years because of this), but you may be able to avoid sales tax if you have a prescription. She may want to call around (medical supply and furniture sales)

## 2013-12-30 ENCOUNTER — Other Ambulatory Visit: Payer: Self-pay | Admitting: *Deleted

## 2013-12-30 MED ORDER — FENTANYL 25 MCG/HR TD PT72: 25.0000 ug | MEDICATED_PATCH | TRANSDERMAL | Status: DC

## 2013-12-30 MED ORDER — FENTANYL 12 MCG/HR TD PT72: 12.5000 ug | MEDICATED_PATCH | TRANSDERMAL | Status: DC

## 2013-12-30 NOTE — Telephone Encounter (Signed)
Left message on machine that rx is ready for pick-up, and it will be at our front desk.  

## 2014-01-03 ENCOUNTER — Other Ambulatory Visit: Payer: Self-pay | Admitting: *Deleted

## 2014-01-03 MED ORDER — LIOTRIX (T3-T4) 15 (3.1-12.5) MG (MCG) PO TABS: 1.0000 | ORAL_TABLET | Freq: Every day | ORAL | Status: DC

## 2014-01-27 ENCOUNTER — Other Ambulatory Visit: Payer: Self-pay | Admitting: Cardiovascular Disease

## 2014-01-27 MED ORDER — ALISKIREN FUMARATE 300 MG PO TABS: ORAL_TABLET | ORAL | Status: DC

## 2014-01-27 NOTE — Telephone Encounter (Signed)
Pt daughter called pharmacy and when she did it refills were not going through. So she was calling us to call them in..Please advise.   [facility] in [place] please

## 2014-01-28 ENCOUNTER — Other Ambulatory Visit: Payer: Self-pay | Admitting: *Deleted

## 2014-01-28 MED ORDER — FENTANYL 25 MCG/HR TD PT72: 25.0000 ug | MEDICATED_PATCH | TRANSDERMAL | Status: DC

## 2014-01-28 MED ORDER — FENTANYL 12 MCG/HR TD PT72: 12.5000 ug | MEDICATED_PATCH | TRANSDERMAL | Status: DC

## 2014-01-28 NOTE — Telephone Encounter (Signed)
Spoke with patient and advised rx ready for pick-up and it will be at the front desk.  

## 2014-02-11 ENCOUNTER — Encounter: Payer: Self-pay | Admitting: Internal Medicine

## 2014-02-11 ENCOUNTER — Other Ambulatory Visit: Payer: Self-pay

## 2014-02-11 ENCOUNTER — Ambulatory Visit (INDEPENDENT_AMBULATORY_CARE_PROVIDER_SITE_OTHER): Payer: Medicare Other | Admitting: Internal Medicine

## 2014-02-11 VITALS — BP 150/94 | HR 70 | Temp 98.4°F | Resp 18 | Wt 280.2 lb

## 2014-02-11 DIAGNOSIS — C8589 Other specified types of non-Hodgkin lymphoma, extranodal and solid organ sites: Secondary | ICD-10-CM

## 2014-02-11 DIAGNOSIS — M5030 Other cervical disc degeneration, unspecified cervical region: Secondary | ICD-10-CM

## 2014-02-11 DIAGNOSIS — Z23 Encounter for immunization: Secondary | ICD-10-CM

## 2014-02-11 DIAGNOSIS — I5032 Chronic diastolic (congestive) heart failure: Secondary | ICD-10-CM

## 2014-02-11 DIAGNOSIS — C8299 Follicular lymphoma, unspecified, extranodal and solid organ sites: Secondary | ICD-10-CM

## 2014-02-11 DIAGNOSIS — E038 Other specified hypothyroidism: Secondary | ICD-10-CM

## 2014-02-11 DIAGNOSIS — I10 Essential (primary) hypertension: Secondary | ICD-10-CM

## 2014-02-11 LAB — COMPREHENSIVE METABOLIC PANEL
ALBUMIN: 3.6 g/dL (ref 3.5–5.2)
ALK PHOS: 46 U/L (ref 39–117)
ALT: 11 U/L (ref 0–35)
AST: 16 U/L (ref 0–37)
BILIRUBIN TOTAL: 0.6 mg/dL (ref 0.2–1.2)
BUN: 31 mg/dL — ABNORMAL HIGH (ref 6–23)
CO2: 29 meq/L (ref 19–32)
Calcium: 10.6 mg/dL — ABNORMAL HIGH (ref 8.4–10.5)
Chloride: 103 mEq/L (ref 96–112)
Creatinine, Ser: 1 mg/dL (ref 0.4–1.2)
GFR: 56.87 mL/min — ABNORMAL LOW (ref >60.00)
Glucose, Bld: 93 mg/dL (ref 70–99)
Potassium: 3.7 mEq/L (ref 3.5–5.1)
SODIUM: 139 meq/L (ref 135–145)
TOTAL PROTEIN: 7.5 g/dL (ref 6.0–8.3)

## 2014-02-11 LAB — CBC WITH DIFFERENTIAL/PLATELET
BASOS ABS: 0 10*3/uL (ref 0.0–0.1)
Basophils Relative: 0.3 % (ref 0.0–3.0)
EOS ABS: 0.1 10*3/uL (ref 0.0–0.7)
Eosinophils Relative: 1.3 % (ref 0.0–5.0)
HEMATOCRIT: 39.2 % (ref 36.0–46.0)
Hemoglobin: 12.6 g/dL (ref 12.0–15.0)
LYMPHS ABS: 0.8 10*3/uL (ref 0.7–4.0)
Lymphocytes Relative: 13.3 % (ref 12.0–46.0)
MCHC: 32 g/dL (ref 30.0–36.0)
MCV: 91 fl (ref 78.0–100.0)
Monocytes Absolute: 0.4 10*3/uL (ref 0.1–1.0)
Monocytes Relative: 6.7 % (ref 3.0–12.0)
Neutro Abs: 4.9 10*3/uL (ref 1.4–7.7)
Neutrophils Relative %: 78.4 % — ABNORMAL HIGH (ref 43.0–77.0)
PLATELETS: 234 10*3/uL (ref 150.0–400.0)
RBC: 4.31 Mil/uL (ref 3.87–5.11)
RDW: 14 % (ref 11.5–15.5)
WBC: 6.3 10*3/uL (ref 4.0–10.5)

## 2014-02-11 LAB — LIPID PANEL
Cholesterol: 185 mg/dL (ref 0–200)
HDL: 41.3 mg/dL (ref >39.00)
LDL Cholesterol: 121 mg/dL — ABNORMAL HIGH (ref 0–99)
NONHDL: 143.7
Total CHOL/HDL Ratio: 4
Triglycerides: 112 mg/dL (ref 0.0–149.0)
VLDL: 22.4 mg/dL (ref 0.0–40.0)

## 2014-02-11 LAB — T4, FREE: Free T4: 0.76 ng/dL (ref 0.60–1.60)

## 2014-02-11 LAB — TSH: TSH: 2.16 u[IU]/mL (ref 0.35–4.50)

## 2014-02-11 MED ORDER — OXYCODONE HCL 5 MG PO TABS: 2.5000 mg | ORAL_TABLET | ORAL | Status: DC | PRN

## 2014-02-11 NOTE — Progress Notes (Signed)
Subjective:    Patient ID: Kayla [name], female    DOB: 1923-11-05, 80 y.o.   MRN: [id number]  HPI Here with daughter [name]  Has had some more pain lately Needing the prn oxycodone at times Neck mostly The prn does help (if tylenol doesn't help)  Takes BP daily at home 129/70 today Gets stressed just getting ready to go to doctor's ---doesn't usually do all prep on daily basis No headaches--just the neck thing No chest pain Gets DOE but this seems to be stable Edema is about the same  Still on the thyroid med No apparent problems  Current Outpatient Prescriptions on File Prior to Visit  Medication Sig Dispense Refill  . aliskiren (TEKTURNA) 300 MG tablet TAKE ONE TABLET BY MOUTH ONCE DAILY  30 tablet  3  . aspirin 81 MG tablet Take 81 mg by mouth daily.        . carvedilol (COREG) 25 MG tablet Take 25 mg by mouth daily.      Sarajane Marek Sodium (LAXATIVE PLUS STOOL SOFTENER) 30-100 MG CAPS Take 1 capsule by mouth 4 (four) times daily as needed.       . cromolyn (NASALCROM) 5.2 MG/ACT nasal spray Place 2 sprays into the nose daily.      . fentaNYL (DURAGESIC - DOSED MCG/HR) 12 MCG/HR Place 1 patch (12.5 mcg total) onto the skin every 3 (three) days. along with the 22mcg patch  10 patch  0  . fentaNYL (DURAGESIC - DOSED MCG/HR) 25 MCG/HR patch Place 1 patch (25 mcg total) onto the skin every 3 (three) days.  10 patch  0  . hydrALAZINE (APRESOLINE) 25 MG tablet Take 1 tablet (25 mg total) by mouth 3 (three) times daily.  100 tablet  6  . Liotrix (THYROLAR-1/4) 15 (3.1-12.5) MG (MCG) TABS Take 1 tablet by mouth daily.  30 each  2  . lisinopril (PRINIVIL,ZESTRIL) 40 MG tablet Take 1 tablet (40 mg total) by mouth daily.  90 tablet  3  . Lutein-Zeaxanthin 6-1 MG TABS Take 1 tablet by mouth daily.        . Magnesium 400 MG CAPS Take 400 mg by mouth daily.      . Melatonin 3 MG CAPS Take 1 capsule by mouth daily.        . NONFORMULARY OR COMPOUNDED ITEM SOVEREIGN NANO  SILVER LIQUID      . phenazopyridine (PYRIDIUM) 100 MG tablet Take 1 tablet (100 mg total) by mouth 3 (three) times daily as needed.  30 tablet  5  . potassium chloride SA (KLOR-CON M20) 20 MEQ tablet TAKE ONE TABLET BY MOUTH TWICE DAILY  60 tablet  3  . Probiotic Product (ACIDOPHILUS PROBIOTIC BLEND) CAPS Take 1 capsule by mouth daily.        . Simethicone (GAS-X MAXIMUM STRENGTH PO) Take 2 tablets by mouth daily.      Marland Kitchen torsemide (DEMADEX) 20 MG tablet TAKE ONE TABLET BY MOUTH EVERY DAY  90 tablet  3  . Vitamin Mixture (CHROMIUM PICOLINATE PO) Take by mouth as directed.         No current facility-administered medications on file prior to visit.    Allergies  Allergen Reactions  . Amoxicillin-Pot Clavulanate   . Cefuroxime Axetil   . Cephalexin     Felt uncomfortable  . Levaquin [Levofloxacin In D5w]   . Sulfonamide Derivatives     unknown    Past Medical History  Diagnosis Date  . HTN (hypertension)  difficult to control  . Hypothyroidism   . OP (osteoporosis)   . Fibromyalgia   . Edema of both legs     chronic  . Degenerative disk disease     cervical  . Follicular lymphoma     Quiescent. Dr. Fransisco Beau; Dr. Kathyrn Sheriff  . Meningioma     Left frontal  . GERD (gastroesophageal reflux disease)   . Obesity   . Cerebral hemorrhage 2004    hypertensive  . Hx: UTI (urinary tract infection)   . Renal artery aneurysm     with stenosis-right; followed by Dr. Delana Meyer  . Diastolic CHF, chronic     Echo (9-11) with EF 65-70%, mild LVH, mid AI, Mild MR, PA systolic pressure > 36OQHU    Past Surgical History  Procedure Laterality Date  . Cataract extraction  10/05 and 4/07    Left then Right [facility])  . Abdominal hysterectomy  1958  . Cerebral hemorrhage  10/2002  . Cervical fusion  1985  . Rectocele repair  1982  . Cystocele repair  1982    Family History  Problem Relation Age of Onset  . Stroke Father   . Cancer Mother     Myeloma  . Hypertension      family      History   Social History  . Marital Status: Married    Spouse Name: N/A    Number of Children: N/A  . Years of Education: N/A   Occupational History  . Retired- Marine scientist and taught    Social History Main Topics  . Smoking status: Never Smoker   . Smokeless tobacco: Never Used     Comment: Quit smoking 50 years ago  . Alcohol Use: No  . Drug Use: No  . Sexual Activity: Not on file   Other Topics Concern  . Not on file   Social History Narrative   Has living will   Daughter [name] is health care POA   Has DNR order already-- reviewed and form redone 01/29/13   No tube feeds if cognitively unaware   Review of Systems No problems with appetite  Weight fairly stable Sleeps 5-7 hours a day---somewhat split. Sleeps in recliner No follow up for lymphoma --considered cured. No swollen glands, etc    Objective:   Physical Exam  Constitutional: She appears well-developed. No distress.  Neck: Normal range of motion. Neck supple. No thyromegaly present.  Cardiovascular: Normal rate, regular rhythm and normal heart sounds.  Exam reveals no gallop.   No murmur heard. Pulmonary/Chest: Effort normal and breath sounds normal. No respiratory distress. She has no wheezes. She has no rales.  Abdominal: Soft. There is no tenderness.  Musculoskeletal:  4+ tense edema  Lymphadenopathy:    She has no cervical adenopathy.  Skin:  Venous stasis changes in calves without ulcer  Psychiatric: She has a normal mood and affect. Her behavior is normal.          Assessment & Plan:

## 2014-02-11 NOTE — Addendum Note (Signed)
Addended by: Geni Bers on: 02/11/2014 02:23 PM   Modules accepted: Orders

## 2014-02-11 NOTE — Assessment & Plan Note (Signed)
Compensated No change in respiratory status Not very active

## 2014-02-11 NOTE — Assessment & Plan Note (Signed)
BP Readings from Last 3 Encounters:  02/11/14 150/94  12/13/13 196/102  08/12/13 150/80   Not bad for her Good control at home and always high here

## 2014-02-11 NOTE — Assessment & Plan Note (Signed)
Will recheck now

## 2014-02-11 NOTE — Progress Notes (Signed)
Pre visit review using our clinic review tool, if applicable. No additional management support is needed unless otherwise documented below in the visit note. 

## 2014-02-11 NOTE — Assessment & Plan Note (Signed)
Still seems to be in remission

## 2014-02-11 NOTE — Assessment & Plan Note (Signed)
Does okay with the analgesic regimen

## 2014-02-11 NOTE — Assessment & Plan Note (Signed)
Seems euthyroid ?Will check labs ?

## 2014-02-14 ENCOUNTER — Telehealth: Payer: Self-pay | Admitting: Internal Medicine

## 2014-02-14 NOTE — Telephone Encounter (Signed)
emmi emailed °

## 2014-02-18 ENCOUNTER — Other Ambulatory Visit: Payer: Self-pay

## 2014-02-21 ENCOUNTER — Telehealth: Payer: Self-pay | Admitting: *Deleted

## 2014-02-21 NOTE — Telephone Encounter (Signed)
There really is very little that can be done even if there is a fracture of the tailbone Reducing pressure with a pad may help some and lying on side

## 2014-02-21 NOTE — Telephone Encounter (Signed)
Daughter left message on my VM stating that pt either broke or bruised her tail bone really bad and wanted to know what they can do about it?  Left message on daughter's VM to get more information

## 2014-02-22 NOTE — Telephone Encounter (Signed)
Spoke with daughter and advised results.  

## 2014-03-02 ENCOUNTER — Other Ambulatory Visit: Payer: Self-pay | Admitting: *Deleted

## 2014-03-02 MED ORDER — FENTANYL 25 MCG/HR TD PT72: 25.0000 ug | MEDICATED_PATCH | TRANSDERMAL | Status: DC

## 2014-03-02 MED ORDER — FENTANYL 12 MCG/HR TD PT72: 12.5000 ug | MEDICATED_PATCH | TRANSDERMAL | Status: DC

## 2014-03-02 NOTE — Telephone Encounter (Signed)
Left message on machine that rx is ready for pick-up, and it will be at our front desk.  

## 2014-03-02 NOTE — Telephone Encounter (Signed)
LETVAK PATIENT, Please send back to me for call in  Last filled 01/28/14

## 2014-03-26 ENCOUNTER — Telehealth: Payer: Self-pay | Admitting: Cardiovascular Disease

## 2014-03-28 NOTE — Telephone Encounter (Signed)
Pt daughter calling to ask if we can authorize med refill on  HydrALAZINE Torsemide    [facility] in [place]

## 2014-03-30 ENCOUNTER — Other Ambulatory Visit: Payer: Self-pay

## 2014-03-30 MED ORDER — HYDRALAZINE HCL 25 MG PO TABS: 25.0000 mg | ORAL_TABLET | Freq: Three times a day (TID) | ORAL | Status: AC

## 2014-04-08 ENCOUNTER — Other Ambulatory Visit: Payer: Self-pay

## 2014-04-08 MED ORDER — FENTANYL 25 MCG/HR TD PT72: 25.0000 ug | MEDICATED_PATCH | TRANSDERMAL | Status: DC

## 2014-04-08 MED ORDER — FENTANYL 12 MCG/HR TD PT72: 12.5000 ug | MEDICATED_PATCH | TRANSDERMAL | Status: DC

## 2014-04-08 NOTE — Telephone Encounter (Signed)
Rx left in front office for pick up and pt is aware  

## 2014-04-08 NOTE — Telephone Encounter (Signed)
[name] left v/m requesting rx for fentanyl patches 12 mcg and 25 mcg. Call when ready for pick up. Pt is using last patch.

## 2014-04-13 ENCOUNTER — Encounter (HOSPITAL_COMMUNITY): Payer: Self-pay | Admitting: *Deleted

## 2014-04-13 ENCOUNTER — Emergency Department (HOSPITAL_COMMUNITY): Payer: Medicare Other

## 2014-04-13 ENCOUNTER — Emergency Department (HOSPITAL_COMMUNITY)
Admission: EM | Admit: 2014-04-13 | Discharge: 2014-04-13 | Disposition: A | Discharge status: Home / Self Care | Payer: Medicare Other | Attending: Emergency Medicine | Admitting: Emergency Medicine

## 2014-04-13 ENCOUNTER — Telehealth: Payer: Self-pay | Admitting: *Deleted

## 2014-04-13 DIAGNOSIS — I5032 Chronic diastolic (congestive) heart failure: Secondary | ICD-10-CM | POA: Insufficient documentation

## 2014-04-13 DIAGNOSIS — Z87440 Personal history of urinary (tract) infections: Secondary | ICD-10-CM | POA: Insufficient documentation

## 2014-04-13 DIAGNOSIS — R52 Pain, unspecified: Secondary | ICD-10-CM

## 2014-04-13 DIAGNOSIS — Z880 Allergy status to penicillin: Secondary | ICD-10-CM | POA: Diagnosis not present

## 2014-04-13 DIAGNOSIS — I10 Essential (primary) hypertension: Secondary | ICD-10-CM | POA: Insufficient documentation

## 2014-04-13 DIAGNOSIS — E039 Hypothyroidism, unspecified: Secondary | ICD-10-CM | POA: Diagnosis not present

## 2014-04-13 DIAGNOSIS — Z79899 Other long term (current) drug therapy: Secondary | ICD-10-CM | POA: Diagnosis not present

## 2014-04-13 DIAGNOSIS — M5441 Lumbago with sciatica, right side: Secondary | ICD-10-CM | POA: Diagnosis not present

## 2014-04-13 DIAGNOSIS — R0602 Shortness of breath: Secondary | ICD-10-CM | POA: Diagnosis present

## 2014-04-13 DIAGNOSIS — Z8739 Personal history of other diseases of the musculoskeletal system and connective tissue: Secondary | ICD-10-CM | POA: Insufficient documentation

## 2014-04-13 DIAGNOSIS — E669 Obesity, unspecified: Secondary | ICD-10-CM | POA: Insufficient documentation

## 2014-04-13 DIAGNOSIS — Z8589 Personal history of malignant neoplasm of other organs and systems: Secondary | ICD-10-CM | POA: Diagnosis not present

## 2014-04-13 DIAGNOSIS — Z7982 Long term (current) use of aspirin: Secondary | ICD-10-CM | POA: Diagnosis not present

## 2014-04-13 DIAGNOSIS — I890 Lymphedema, not elsewhere classified: Secondary | ICD-10-CM | POA: Insufficient documentation

## 2014-04-13 LAB — BASIC METABOLIC PANEL
ANION GAP: 12 (ref 5–15)
BUN: 27 mg/dL — ABNORMAL HIGH (ref 6–23)
CO2: 29 mEq/L (ref 19–32)
CREATININE: 0.93 mg/dL (ref 0.50–1.10)
Calcium: 10.8 mg/dL — ABNORMAL HIGH (ref 8.4–10.5)
Chloride: 103 mEq/L (ref 96–112)
GFR calc non Af Amer: 53 mL/min — ABNORMAL LOW (ref >90)
GFR, EST AFRICAN AMERICAN: 62 mL/min — AB (ref >90)
Glucose, Bld: 97 mg/dL (ref 70–99)
Potassium: 3.9 mEq/L (ref 3.7–5.3)
Sodium: 144 mEq/L (ref 137–147)

## 2014-04-13 LAB — CBC
HEMATOCRIT: 42.2 % (ref 36.0–46.0)
HEMOGLOBIN: 13.3 g/dL (ref 12.0–15.0)
MCH: 29.7 pg (ref 26.0–34.0)
MCHC: 31.5 g/dL (ref 30.0–36.0)
MCV: 94.2 fL (ref 78.0–100.0)
Platelets: 198 10*3/uL (ref 150–400)
RBC: 4.48 MIL/uL (ref 3.87–5.11)
RDW: 14.1 % (ref 11.5–15.5)
WBC: 6 10*3/uL (ref 4.0–10.5)

## 2014-04-13 LAB — I-STAT TROPONIN, ED: Troponin i, poc: 0.02 ng/mL (ref 0.00–0.08)

## 2014-04-13 LAB — PRO B NATRIURETIC PEPTIDE: PRO B NATRI PEPTIDE: 470.9 pg/mL — AB (ref 0–450)

## 2014-04-13 NOTE — ED Notes (Signed)
Pt to xray at this time.

## 2014-04-13 NOTE — Telephone Encounter (Signed)
Left message on daughter's VM, asking to call us to let us know what happens.

## 2014-04-13 NOTE — ED Notes (Addendum)
Pt reports severe leg swelling for years but now having pain to right leg from her hip to her foot.pt also reports sob, denies recent cough. Also having neck pain.

## 2014-04-13 NOTE — Discharge Instructions (Signed)

## 2014-04-13 NOTE — Telephone Encounter (Signed)
Please check on her tomorrow 

## 2014-04-13 NOTE — Telephone Encounter (Signed)
Spoke with daughter and she is taking pt to [facility], per daughter pt is having pain in her right hip that goes to the bottom of her feet. Pt states she is also having a hard time breathing. Daughter is not giving pt 2 of the 5mg  oxycodone and it's not helping per pt. Daughter was thinking that maybe pt might need a MRI, CT or x-ray to see whats going on and rather bring her here she will go to [facility], per daughter pt is tired and wants to feel better. Daughter wanted to let Dr. Silvio Pate be aware and if he thought anything different then she will have her cell phone on. [phone]

## 2014-04-13 NOTE — ED Provider Notes (Signed)
CSN: [id number]     Arrival date & time 04/13/14  1524 History   First MD Initiated Contact with Patient 04/13/14 1637     Chief Complaint  Patient presents with  . Leg Pain  . Shortness of Breath     (Consider location/radiation/quality/duration/timing/severity/associated sxs/prior Treatment) Patient is a 80 y.o. female presenting with leg pain and shortness of breath. The history is provided by the patient.  Leg Pain Associated symptoms: back pain and fatigue   Shortness of Breath Associated symptoms: no abdominal pain, no headaches, no rash and no vomiting    patient presents with shortness of breath and pain in her right lower back/hip. She has chronic pain in both her feet and some chronic pain in her back. States his been worse for a while now. She has chronic CHF also has been getting more shortness of breath over the last 3 weeks. No trauma. No fevers. States she is has more pain in her feet chronically in his been able to walk as much due to that. She states she can walk to the bathroom somewhat. Her weights have been similar to her baseline in the office visits. No fevers. No dysuria. No cough. No fall recently.  Past Medical History  Diagnosis Date  . HTN (hypertension)     difficult to control  . Hypothyroidism   . OP (osteoporosis)   . Fibromyalgia   . Edema of both legs     chronic  . Degenerative disk disease     cervical  . Follicular lymphoma     Quiescent. Dr. Fransisco Beau; Dr. Kathyrn Sheriff  . Meningioma     Left frontal  . GERD (gastroesophageal reflux disease)   . Obesity   . Cerebral hemorrhage 2004    hypertensive  . Hx: UTI (urinary tract infection)   . Renal artery aneurysm     with stenosis-right; followed by Dr. Delana Meyer  . Diastolic CHF, chronic     Echo (9-11) with EF 65-70%, mild LVH, mid AI, Mild MR, PA systolic pressure > 56YBWL   Past Surgical History  Procedure Laterality Date  . Cataract extraction  10/05 and 4/07    Left then Right [facility])   . Abdominal hysterectomy  1958  . Cerebral hemorrhage  10/2002  . Cervical fusion  1985  . Rectocele repair  1982  . Cystocele repair  1982   Family History  Problem Relation Age of Onset  . Stroke Father   . Cancer Mother     Myeloma  . Hypertension      family   History  Substance Use Topics  . Smoking status: Never Smoker   . Smokeless tobacco: Never Used     Comment: Quit smoking 50 years ago  . Alcohol Use: No   OB History    No data available     Review of Systems  Constitutional: Positive for fatigue. Negative for activity change and appetite change.  Eyes: Negative for pain.  Respiratory: Positive for shortness of breath. Negative for chest tightness.   Cardiovascular: Positive for leg swelling.  Gastrointestinal: Negative for nausea, vomiting, abdominal pain and diarrhea.  Genitourinary: Negative for flank pain.  Musculoskeletal: Positive for back pain. Negative for neck stiffness.  Skin: Negative for rash and wound.  Neurological: Negative for weakness, numbness and headaches.  Psychiatric/Behavioral: Negative for behavioral problems.      Allergies  Amoxicillin-pot clavulanate; Cefuroxime axetil; Cephalexin; Levaquin; and Sulfonamide derivatives  Home Medications   Prior to Admission medications  Medication Sig Start Date End Date Taking? Authorizing Provider  aliskiren (TEKTURNA) 300 MG tablet TAKE ONE TABLET BY MOUTH ONCE DAILY 01/27/14  Yes Wellington Hampshire, MD  carvedilol (COREG) 25 MG tablet Take 25 mg by mouth daily. 03/22/13  Yes Wellington Hampshire, MD  Casanthranol-Docusate Sodium (LAXATIVE PLUS STOOL SOFTENER) 30-100 MG CAPS Take 1 capsule by mouth 2 (two) times daily.    Yes Historical Provider, MD  cromolyn (NASALCROM) 5.2 MG/ACT nasal spray Place 2 sprays into the nose daily.   Yes Venia Carbon, MD  fentaNYL (DURAGESIC - DOSED MCG/HR) 12 MCG/HR Place 1 patch (12.5 mcg total) onto the skin every 3 (three) days. along with the 69mcg patch  04/08/14  Yes Venia Carbon, MD  fentaNYL (DURAGESIC - DOSED MCG/HR) 25 MCG/HR patch Place 1 patch (25 mcg total) onto the skin every 3 (three) days. 04/08/14  Yes Venia Carbon, MD  hydrALAZINE (APRESOLINE) 25 MG tablet Take 1 tablet (25 mg total) by mouth 3 (three) times daily. 03/30/14  Yes Wellington Hampshire, MD  Liotrix [facility]) 15 (3.1-12.5) MG (MCG) TABS Take 1 tablet by mouth daily. 01/03/14  Yes Venia Carbon, MD  lisinopril (PRINIVIL,ZESTRIL) 40 MG tablet Take 1 tablet (40 mg total) by mouth daily. 04/19/13  Yes Wellington Hampshire, MD  Lutein-Zeaxanthin 6-1 MG TABS Take 1 tablet by mouth daily.     Yes Historical Provider, MD  Magnesium 400 MG CAPS Take 400 mg by mouth daily.   Yes Historical Provider, MD  Melatonin 3 MG CAPS Take 1 capsule by mouth daily.     Yes Historical Provider, MD  NONFORMULARY OR COMPOUNDED ITEM Take 5 mLs by mouth 2 (two) times daily. SOVEREIGN NANO SILVER LIQUID   Yes Historical Provider, MD  oxyCODONE (OXY IR/ROXICODONE) 5 MG immediate release tablet Take 0.5 tablets (2.5 mg total) by mouth as needed for severe pain. 02/11/14  Yes Venia Carbon, MD  phenazopyridine (PYRIDIUM) 100 MG tablet Take 1 tablet (100 mg total) by mouth 3 (three) times daily as needed. Patient taking differently: Take 100 mg by mouth 3 (three) times daily as needed for pain.  08/12/13  Yes Venia Carbon, MD  potassium chloride SA (KLOR-CON M20) 20 MEQ tablet TAKE ONE TABLET BY MOUTH TWICE DAILY 12/06/13  Yes Wellington Hampshire, MD  Probiotic Product (ACIDOPHILUS PROBIOTIC BLEND) CAPS Take 1 capsule by mouth daily.     Yes Historical Provider, MD  Simethicone (GAS-X MAXIMUM STRENGTH PO) Take 2 tablets by mouth daily.   Yes Historical Provider, MD  torsemide (DEMADEX) 20 MG tablet TAKE ONE TABLET BY MOUTH ONCE DAILY 03/28/14  Yes Wellington Hampshire, MD  Vitamin Mixture (CHROMIUM PICOLINATE PO) Take 1 tablet by mouth daily.    Yes Historical Provider, MD  aspirin 81 MG tablet Take 81 mg  by mouth daily.      Historical Provider, MD  carvedilol (COREG) 25 MG tablet TAKE ONE TABLET BY MOUTH TWICE DAILY WITH MEALS Patient not taking: Reported on 04/13/2014 03/28/14   Wellington Hampshire, MD   BP 182/80 mmHg  Pulse 73  Temp(Src) 97.7 F (36.5 C) (Oral)  Resp 19  SpO2 95% Physical Exam  Constitutional: She is oriented to person, place, and time. She appears well-developed.  Neck: Neck supple.  Cardiovascular: Normal rate.   Pulmonary/Chest:  Difficult examination due to body habitus. No frank rales. Patient is short of breath after transferring from chair to bed.  Abdominal: There is no  tenderness.  Musculoskeletal:  Chronic severe bilateral lower extremity lymphedema.  Neurological: She is alert and oriented to person, place, and time.  Skin: Skin is warm.    ED Course  Procedures (including critical care time) Labs Review Labs Reviewed  PRO B NATRIURETIC PEPTIDE - Abnormal; Notable for the following:    Pro B Natriuretic peptide (BNP) 470.9 (*)    All other components within normal limits  BASIC METABOLIC PANEL - Abnormal; Notable for the following:    BUN 27 (*)    Calcium 10.8 (*)    GFR calc non Af Amer 53 (*)    GFR calc Af Amer 62 (*)    All other components within normal limits  CBC  I-STAT TROPOININ, ED    Imaging Review No results found.   EKG Interpretation   Date/Time:  Wednesday April 13 2014 15:56:41 EST Ventricular Rate:  66 PR Interval:  228 QRS Duration: 114 QT Interval:  390 QTC Calculation: 408 R Axis:   -46 Text Interpretation:  Sinus rhythm with 1st degree A-V block Left axis  deviation Anterolateral infarct , age undetermined Abnormal ECG No  significant change since last tracing Confirmed by Alvino Chapel  MD, Ovid Curd  [phone]) on 04/13/2014 5:11:20 PM      MDM   Final diagnoses:  Pain  Chronic diastolic heart failure  Right-sided low back pain with right-sided sciatica     patient with chronic lymphedema and some chronic  pain. Worsening right hip pain that radiates. Laboratory reassuring. Acute on chronic pain. Will discharge home. Negative x-rays.    Jasper Riling. Alvino Chapel, MD 04/16/14 [phone]

## 2014-04-13 NOTE — ED Notes (Signed)
Pt c/o shortness of breath when she exerts herself

## 2014-04-18 ENCOUNTER — Telehealth: Payer: Self-pay | Admitting: Internal Medicine

## 2014-04-18 MED ORDER — OXYCODONE HCL 5 MG PO TABS: 2.5000 mg | ORAL_TABLET | Freq: Four times a day (QID) | ORAL | Status: DC | PRN

## 2014-04-18 NOTE — Telephone Encounter (Signed)
Pt daughter called requesting refill for oxycodone. She will be in [place] for the next few hours and would really like to pick up this afternoon. Please advise.

## 2014-04-18 NOTE — Telephone Encounter (Signed)
Left message on machine that rx is ready for pick-up, and it will be at our front desk.  

## 2014-05-02 ENCOUNTER — Other Ambulatory Visit: Payer: Self-pay | Admitting: Cardiovascular Disease

## 2014-05-02 ENCOUNTER — Telehealth: Payer: Self-pay | Admitting: *Deleted

## 2014-05-02 NOTE — Telephone Encounter (Signed)
[facility] Pharmacist called and is worried about patients potassium level being to high being on Linsiopril 40mg  and tekturna 300mg  patient also on potassium 20mg . Please call

## 2014-05-03 NOTE — Telephone Encounter (Signed)
Most recent K was fine just 2 weeks ago (3.9). She never had issues with high K.

## 2014-05-03 NOTE — Telephone Encounter (Signed)
Informed pharmacy manager of Dr. Jacklynn Ganong response  He verbalized understanding

## 2014-05-04 ENCOUNTER — Other Ambulatory Visit: Payer: Self-pay | Admitting: *Deleted

## 2014-05-04 MED ORDER — LIOTRIX (T3-T4) 15 (3.1-12.5) MG (MCG) PO TABS: 1.0000 | ORAL_TABLET | Freq: Every day | ORAL | Status: DC

## 2014-05-10 ENCOUNTER — Telehealth: Payer: Self-pay | Admitting: *Deleted

## 2014-05-10 MED ORDER — NONFORMULARY OR COMPOUNDED ITEM: Status: AC

## 2014-05-10 NOTE — Telephone Encounter (Signed)
rx sent to pharmacy by e-script  

## 2014-05-12 ENCOUNTER — Telehealth: Payer: Self-pay | Admitting: Internal Medicine

## 2014-05-12 MED ORDER — FENTANYL 25 MCG/HR TD PT72: 25.0000 ug | MEDICATED_PATCH | TRANSDERMAL | Status: DC

## 2014-05-12 MED ORDER — FENTANYL 12 MCG/HR TD PT72: 12.5000 ug | MEDICATED_PATCH | TRANSDERMAL | Status: DC

## 2014-05-12 NOTE — Telephone Encounter (Signed)
04/08/14 

## 2014-05-12 NOTE — Telephone Encounter (Signed)
Patient's daughter is requesting a refill for patient's Fentanyl.

## 2014-05-13 NOTE — Telephone Encounter (Signed)
Spoke with patient and advised rx ready for pick-up and it will be at the front desk.  

## 2014-05-16 ENCOUNTER — Telehealth: Payer: Self-pay | Admitting: *Deleted

## 2014-05-16 MED ORDER — OXYCODONE HCL 5 MG PO TABS: 2.5000 mg | ORAL_TABLET | Freq: Four times a day (QID) | ORAL | Status: DC | PRN

## 2014-05-16 NOTE — Telephone Encounter (Signed)
04/18/14 

## 2014-05-17 NOTE — Telephone Encounter (Signed)
Spoke with daughter and advised results.  

## 2014-05-30 ENCOUNTER — Telehealth (HOSPITAL_COMMUNITY): Payer: Self-pay | Admitting: Vascular Surgery

## 2014-05-30 MED ORDER — ALISKIREN FUMARATE 300 MG PO TABS: ORAL_TABLET | ORAL | Status: DC

## 2014-05-30 NOTE — Telephone Encounter (Signed)
Refill Tekuturna 300 mg

## 2014-06-01 ENCOUNTER — Telehealth (HOSPITAL_COMMUNITY): Payer: Self-pay | Admitting: Vascular Surgery

## 2014-06-01 MED ORDER — ALISKIREN FUMARATE 300 MG PO TABS: ORAL_TABLET | ORAL | Status: DC

## 2014-06-01 NOTE — Telephone Encounter (Signed)
Pt daughter called pt need refill Tekturna call in [place]

## 2014-06-06 ENCOUNTER — Other Ambulatory Visit: Payer: Self-pay

## 2014-06-06 MED ORDER — FENTANYL 25 MCG/HR TD PT72: 25.0000 ug | MEDICATED_PATCH | TRANSDERMAL | Status: DC

## 2014-06-06 MED ORDER — FENTANYL 12 MCG/HR TD PT72: 12.5000 ug | MEDICATED_PATCH | TRANSDERMAL | Status: DC

## 2014-06-06 NOTE — Telephone Encounter (Signed)
It is a bit early but I refilled them. They shouldn't need it again for over a month now

## 2014-06-06 NOTE — Telephone Encounter (Signed)
Spoke with Leeanne Rio, Patient's daughter. Clair requested a refill for her mother's Fentanyl patches. Patient's last office visit was 02/11/14 and Rx lasted filled 05/12/14. Please advise.

## 2014-06-06 NOTE — Telephone Encounter (Signed)
Called daughter to notify her Rx is ready for pick-up.

## 2014-06-09 ENCOUNTER — Telehealth: Payer: Self-pay | Admitting: Internal Medicine

## 2014-06-09 NOTE — Telephone Encounter (Signed)
Discussed with her Fairly superficial and not inflamed She has such edema though healing may be tough  Can try neosporin and gauze to try to keep it dry Keep leg elevated Kayla [name] will check it again in AM, if any redness, she will set up appt

## 2014-06-09 NOTE — Telephone Encounter (Signed)
Pts daughter came in today to pick rx for Mother and said that she has a place on her left leg that she has rubbed the skin off and it is open and weeping. Per daughter it is weeping quite and bit. I asked daughter to schedule appt but she wanted to see what Dr Silvio Pate thought before she scheduled appt. Daughter Wilfred Curtis is requesting a call back.

## 2014-06-15 ENCOUNTER — Encounter (HOSPITAL_COMMUNITY): Payer: Self-pay

## 2014-06-15 ENCOUNTER — Ambulatory Visit (HOSPITAL_COMMUNITY)
Admission: RE | Admit: 2014-06-15 | Discharge: 2014-06-15 | Disposition: A | Discharge status: Home / Self Care | Payer: Medicare Other | Source: Ambulatory Visit | Attending: Adult Health | Admitting: Adult Health

## 2014-06-15 VITALS — BP 118/64 | HR 69 | Resp 22 | Wt 289.0 lb

## 2014-06-15 DIAGNOSIS — I5032 Chronic diastolic (congestive) heart failure: Secondary | ICD-10-CM | POA: Diagnosis present

## 2014-06-15 DIAGNOSIS — I890 Lymphedema, not elsewhere classified: Secondary | ICD-10-CM | POA: Diagnosis not present

## 2014-06-15 DIAGNOSIS — I272 Other secondary pulmonary hypertension: Secondary | ICD-10-CM | POA: Insufficient documentation

## 2014-06-15 DIAGNOSIS — I270 Primary pulmonary hypertension: Secondary | ICD-10-CM

## 2014-06-15 MED ORDER — TORSEMIDE 20 MG PO TABS: 20.0000 mg | ORAL_TABLET | Freq: Every day | ORAL | Status: DC

## 2014-06-15 NOTE — Patient Instructions (Addendum)
Take additional 20 mg of Torsemide on Wednesdays   Your physician has requested that you have an echocardiogram. Echocardiography is a painless test that uses sound waves to create images of your heart. It provides your doctor with information about the size and shape of your heart and how well your heart's chambers and valves are working. This procedure takes approximately one hour. There are no restrictions for this procedure. (NEXT AVAILABLE)  You have been referred to have a overnight pulse oximetry, someone will be in contact with you to arrange setting this up.  Your physician recommends that you schedule a follow-up appointment in: 4 months  Do the following things EVERYDAY: 1) Weigh yourself in the morning before breakfast. Write it down and keep it in a log. 2) Take your medicines as prescribed 3) Eat low salt foods-Limit salt (sodium) to 2000 mg per day.  4) Stay as active as you can everyday 5) Limit all fluids for the day to less than 2 liters 6)

## 2014-06-15 NOTE — Progress Notes (Signed)
Patient ID: Kayla [name], female   DOB: [birth year], 81 y.o.   MRN: [id number]     HPI  This is an 81 year old female who is here today for a followup visit. She has a history of HTN, morbid obesity, chronic diastolic heart failure and chronic lower extremity lymphedema. She has had leg swelling bilaterally for > 10 years.  She cannot wear compression stockings (unable to put them on). She has been short of breath after walking about 30 feet chronically. She sleeps with 3 pillows at night, mainly for GERD rather than orthopnea. No chest pain/tightness.   Most recent echocardiogram was in 2011 which showed normal ejection fraction with RVSP > 40. She previously was seen by Dr. Fletcher [name] and now wants to switch to Dr. Aundra [name].   She is here with her daughter. Overall she has been reasonably stable. Her mobility is very limited. Dyspnea is stable. Cannot lay flat in bed due to her previous neck fusion. Struggles with incontinence - uses 16 pads per day. Takes torsemide 20mg  daily with good result. BP relatively well controlled. But occasionally goes high and her daughter gives her an extra dose of hydralazine.  Allergies  Allergen Reactions  . Amoxicillin-Pot Clavulanate     unknown  . Cefuroxime Axetil     unknown  . Cephalexin     Felt uncomfortable  . Levaquin [Levofloxacin In D5w]     unknown  . Sulfonamide Derivatives     unknown     Current Outpatient Prescriptions on File Prior to Encounter  Medication Sig Dispense Refill  . aliskiren (TEKTURNA) 300 MG tablet TAKE ONE TABLET BY MOUTH ONCE DAILY 30 tablet 3  . aspirin 81 MG tablet Take 81 mg by mouth daily.      . carvedilol (COREG) 25 MG tablet Take 25 mg by mouth daily.    Kayla [name] Sodium (LAXATIVE PLUS STOOL SOFTENER) 30-100 MG CAPS Take 1 capsule by mouth 2 (two) times daily.     . cromolyn (NASALCROM) 5.2 MG/ACT nasal spray Place 2 sprays into the nose daily.    . fentaNYL (DURAGESIC - DOSED MCG/HR) 12 MCG/HR Place 1  patch (12.5 mcg total) onto the skin every 3 (three) days. along with the 30mcg patch 10 patch 0  . hydrALAZINE (APRESOLINE) 25 MG tablet Take 1 tablet (25 mg total) by mouth 3 (three) times daily. 100 tablet 6  . lisinopril (PRINIVIL,ZESTRIL) 40 MG tablet TAKE ONE TABLET BY MOUTH ONCE DAILY 90 tablet 3  . Lutein-Zeaxanthin 6-1 MG TABS Take 1 tablet by mouth daily.      . Magnesium 400 MG CAPS Take 400 mg by mouth daily.    . Melatonin 3 MG CAPS Take 1 capsule by mouth daily.      . NONFORMULARY OR COMPOUNDED ITEM Take 5 mLs by mouth 2 (two) times daily. SOVEREIGN NANO SILVER LIQUID    . NONFORMULARY OR COMPOUNDED ITEM T3 3.1 mcg/ T4 12.5 mcg take 1 capsule by mouth daily 30 each 11  . oxyCODONE (OXY IR/ROXICODONE) 5 MG immediate release tablet Take 0.5 tablets (2.5 mg total) by mouth every 6 (six) hours as needed for severe pain. 30 tablet 0  . potassium chloride SA (KLOR-CON M20) 20 MEQ tablet TAKE ONE TABLET BY MOUTH TWICE DAILY (Patient taking differently: 20 mEq daily. TAKE ONE TABLET BY MOUTH TWICE DAILY) 60 tablet 3  . Probiotic Product (ACIDOPHILUS PROBIOTIC BLEND) CAPS Take 1 capsule by mouth daily.      . Simethicone (GAS-X  MAXIMUM STRENGTH PO) Take 2 tablets by mouth daily.    Marland Kitchen torsemide (DEMADEX) 20 MG tablet TAKE ONE TABLET BY MOUTH ONCE DAILY 90 tablet 3  . Vitamin Mixture (CHROMIUM PICOLINATE PO) Take 1 tablet by mouth daily.      No current facility-administered medications on file prior to encounter.     Past Medical History  Diagnosis Date  . HTN (hypertension)     difficult to control  . Hypothyroidism   . OP (osteoporosis)   . Fibromyalgia   . Edema of both legs     chronic  . Degenerative disk disease     cervical  . Follicular lymphoma     Quiescent. Dr. Fransisco [name]; Dr. Kathyrn [name]  . Meningioma     Left frontal  . GERD (gastroesophageal reflux disease)   . Obesity   . Cerebral hemorrhage 2004    hypertensive  . Hx: UTI (urinary tract infection)   . Renal artery  aneurysm     with stenosis-right; followed by Dr. Delana [name]  . Diastolic CHF, chronic     Echo (9-11) with EF 65-70%, mild LVH, mid AI, Mild MR, PA systolic pressure > 53GDJM     Past Surgical History  Procedure Laterality Date  . Cataract extraction  10/05 and 4/07    Left then Right [facility])  . Abdominal hysterectomy  1958  . Cerebral hemorrhage  10/2002  . Cervical fusion  1985  . Rectocele repair  1982  . Cystocele repair  1982     Family History  Problem Relation Age of Onset  . Stroke Father   . Cancer Mother     Myeloma  . Hypertension      family     History   Social History  . Marital Status: Married    Spouse Name: N/A  . Number of Children: N/A  . Years of Education: N/A   Occupational History  . Retired- Marine scientist and taught    Social History Main Topics  . Smoking status: Never Smoker   . Smokeless tobacco: Never Used     Comment: Quit smoking 50 years ago  . Alcohol Use: No  . Drug Use: No  . Sexual Activity: Not on file   Other Topics Concern  . Not on file   Social History Narrative   Has living will   Daughter Kayla [name] is health care POA   Has DNR order already-- reviewed and form redone 01/29/13   No tube feeds if cognitively unaware     PHYSICAL EXAM   BP 118/64 mmHg  Pulse 69  Resp 22  Wt 289 lb (131.09 kg)  SpO2 90%  General:  Morbidly obese woman sitting in WC. No resp difficulty HEENT: normal Neck: supple. JVP 9. Carotids 2+ bilat; no bruits. No lymphadenopathy or thryomegaly appreciated. Cor: PMI nondisplaced. Regular rate & rhythm. 2/6 SEM across precordium Lungs: clear Abdomen: morbidly obese soft, nontender, nondistended. No hepatosplenomegaly. No bruits or masses. Good bowel sounds. Extremities: no cyanosis, clubbing, rash, severe lympheedema with skin tear on left Neuro: alert & orientedx3, cranial nerves grossly intact. moves all 4 extremities w/o difficulty. Affect pleasant   ASSESSMENT AND PLAN 1. Chronic  diastolic HF with R>>L symptoms - Volume status mildly elevated. Instructed her daughter that she can take extra dose of torsemide 1-2x/week as needed with extra potassium' - Repeat echo 2. Pulmonary HTN - Suspect due to OHS and possible undiagnosed OSA. She denies heavy snoring - Place overnight oximeter. - Repeat echo  3. Morbid obesity 4. HTN  - Well controlled. Agree with extra-dose hydralazine for SBP > 170  5. Lymphedema - has not been able to tolerate compression hose.   RTC in 4 months.  Daniel Bensimhon,MD 1:27 PM

## 2014-06-23 ENCOUNTER — Ambulatory Visit (HOSPITAL_COMMUNITY)
Admission: RE | Admit: 2014-06-23 | Discharge: 2014-06-23 | Disposition: A | Discharge status: Home / Self Care | Payer: Medicare Other | Source: Ambulatory Visit | Attending: Internal Medicine | Admitting: Internal Medicine

## 2014-06-23 DIAGNOSIS — I509 Heart failure, unspecified: Secondary | ICD-10-CM | POA: Insufficient documentation

## 2014-06-23 DIAGNOSIS — I10 Essential (primary) hypertension: Secondary | ICD-10-CM | POA: Insufficient documentation

## 2014-06-23 DIAGNOSIS — I5032 Chronic diastolic (congestive) heart failure: Secondary | ICD-10-CM

## 2014-06-23 NOTE — Progress Notes (Signed)
  Echocardiogram 2D Echocardiogram has been performed.  Kayla [name] 06/23/2014, 2:17 PM

## 2014-06-28 ENCOUNTER — Telehealth (HOSPITAL_COMMUNITY): Payer: Self-pay | Admitting: Vascular Surgery

## 2014-06-28 NOTE — Telephone Encounter (Signed)
Pt daughter called she went to pick pt prescriptions and her Lisinipril has been d/c per the pharmacy she needs to talk to someone to clarify medications..please advise

## 2014-06-28 NOTE — Telephone Encounter (Signed)
Daughter called stating pharmacy had a message stating "all medications discontinued".  Per our notes and records, patient is still on all medications as prescribed.  I did let patients daughter know that when we send in prescriptions to pharmacies we usually have a comment box that reads "please discontinue all PREVIOUS prescriptions for this medcation" and that this may be the message they received and may have misinterpreted it.  Daughter states patient has all her medications and will call us when she needs refills.  Renee Pain

## 2014-06-30 ENCOUNTER — Telehealth: Payer: Self-pay

## 2014-06-30 NOTE — Telephone Encounter (Signed)
[name] called on behalf of patient requesting oxycodone refill.  Last seen 02/11/2014.  Last filled 05/16/2014.  Please advise.

## 2014-07-01 MED ORDER — OXYCODONE HCL 5 MG PO TABS: 2.5000 mg | ORAL_TABLET | Freq: Four times a day (QID) | ORAL | Status: DC | PRN

## 2014-07-01 NOTE — Telephone Encounter (Signed)
Kayla [name] is aware Oxycodone rx is ready for pick up.

## 2014-07-14 ENCOUNTER — Other Ambulatory Visit: Payer: Self-pay

## 2014-07-14 MED ORDER — FENTANYL 12 MCG/HR TD PT72: 12.5000 ug | MEDICATED_PATCH | TRANSDERMAL | Status: DC

## 2014-07-14 NOTE — Telephone Encounter (Signed)
Wilfred Curtis pts daughter left v/m requesting fentanyl patch. Call when ready for pick up. Pt last seen 02/11/14 and last printed fentanyl on 06/06/2014.Please advise.

## 2014-07-14 NOTE — Telephone Encounter (Signed)
RX printed and signed, placed in MYD box

## 2014-07-14 NOTE — Telephone Encounter (Signed)
Left message on voicemail Rx left in front office for pick up  

## 2014-07-19 ENCOUNTER — Other Ambulatory Visit: Payer: Self-pay

## 2014-07-19 NOTE — Telephone Encounter (Signed)
Kayla [name] left v/m requesting rx fentanyl 25 mcg patch. Request cb when ready for pick up; today if possible. Pt last seen 02/11/14. Pt scheduled for med wellness on 08/18/14.

## 2014-07-20 MED ORDER — FENTANYL 25 MCG/HR TD PT72: 25.0000 ug | MEDICATED_PATCH | TRANSDERMAL | Status: DC

## 2014-07-20 NOTE — Telephone Encounter (Signed)
Left message on machine that rx is ready for pick-up, and it will be at our front desk.  

## 2014-07-25 ENCOUNTER — Other Ambulatory Visit (HOSPITAL_COMMUNITY): Payer: Self-pay

## 2014-07-25 ENCOUNTER — Other Ambulatory Visit: Payer: Self-pay | Admitting: *Deleted

## 2014-07-25 ENCOUNTER — Telehealth (HOSPITAL_COMMUNITY): Payer: Self-pay | Admitting: Vascular Surgery

## 2014-07-25 MED ORDER — OXYCODONE HCL 5 MG PO TABS: 2.5000 mg | ORAL_TABLET | Freq: Four times a day (QID) | ORAL | Status: DC | PRN

## 2014-07-25 MED ORDER — TORSEMIDE 20 MG PO TABS: 20.0000 mg | ORAL_TABLET | Freq: Every day | ORAL | Status: AC

## 2014-07-25 NOTE — Telephone Encounter (Signed)
Pt daughter called pt Insurance is not paying for her Torsemide anymore she spoke to [facility] when she was her last he said he would order her something else.. Please advise

## 2014-07-25 NOTE — Telephone Encounter (Signed)
Patient's daughter called requesting a refill on Oxycodone. Last refill 07/01/14 #30. Call when ready for pickup.

## 2014-07-25 NOTE — Telephone Encounter (Signed)
Drug is not listed as nonformulary with insurance.  Nonformulary exception form requested.  Will send 30 day supply through our fund at [facility] until process completed.  Patient and daughter aware and agreeable to plan.  Renee Pain

## 2014-07-26 NOTE — Telephone Encounter (Signed)
Spoke with patient and advised rx ready for pick-up and it will be at the front desk.  

## 2014-08-16 ENCOUNTER — Other Ambulatory Visit: Payer: Self-pay | Admitting: *Deleted

## 2014-08-16 NOTE — Telephone Encounter (Signed)
Patient's daughter left a voicemail that she needs a refill on Fentanyl patches. Last refill on both 07/14/14 #10 on each. Call when ready for pickup.

## 2014-08-17 MED ORDER — FENTANYL 12 MCG/HR TD PT72: 12.5000 ug | MEDICATED_PATCH | TRANSDERMAL | Status: DC

## 2014-08-17 MED ORDER — FENTANYL 25 MCG/HR TD PT72: 25.0000 ug | MEDICATED_PATCH | TRANSDERMAL | Status: DC

## 2014-08-17 NOTE — Telephone Encounter (Signed)
Spoke with patient and advised rx ready for pick-up and it will be at the front desk.  

## 2014-08-18 ENCOUNTER — Encounter: Payer: Self-pay | Admitting: Internal Medicine

## 2014-08-18 ENCOUNTER — Ambulatory Visit (INDEPENDENT_AMBULATORY_CARE_PROVIDER_SITE_OTHER): Payer: Medicare Other | Admitting: Internal Medicine

## 2014-08-18 VITALS — BP 146/92 | HR 71 | Temp 98.0°F | Wt 286.0 lb

## 2014-08-18 DIAGNOSIS — M5030 Other cervical disc degeneration, unspecified cervical region: Secondary | ICD-10-CM | POA: Diagnosis not present

## 2014-08-18 DIAGNOSIS — I5032 Chronic diastolic (congestive) heart failure: Secondary | ICD-10-CM | POA: Diagnosis not present

## 2014-08-18 DIAGNOSIS — Z0000 Encounter for general adult medical examination without abnormal findings: Secondary | ICD-10-CM | POA: Diagnosis not present

## 2014-08-18 DIAGNOSIS — E039 Hypothyroidism, unspecified: Secondary | ICD-10-CM

## 2014-08-18 DIAGNOSIS — K2100 Gastro-esophageal reflux disease with esophagitis, without bleeding: Secondary | ICD-10-CM

## 2014-08-18 DIAGNOSIS — Z7189 Other specified counseling: Secondary | ICD-10-CM

## 2014-08-18 DIAGNOSIS — I270 Primary pulmonary hypertension: Secondary | ICD-10-CM | POA: Diagnosis not present

## 2014-08-18 DIAGNOSIS — I272 Pulmonary hypertension, unspecified: Secondary | ICD-10-CM

## 2014-08-18 LAB — COMPREHENSIVE METABOLIC PANEL
ALT: 16 U/L (ref 0–35)
AST: 16 U/L (ref 0–37)
Albumin: 3.8 g/dL (ref 3.5–5.2)
Alkaline Phosphatase: 43 U/L (ref 39–117)
BILIRUBIN TOTAL: 0.5 mg/dL (ref 0.2–1.2)
BUN: 20 mg/dL (ref 6–23)
CALCIUM: 10.8 mg/dL — AB (ref 8.4–10.5)
CO2: 32 mEq/L (ref 19–32)
Chloride: 104 mEq/L (ref 96–112)
Creatinine, Ser: 0.9 mg/dL (ref 0.40–1.20)
GFR: 62.66 mL/min (ref >60.00)
Glucose, Bld: 118 mg/dL — ABNORMAL HIGH (ref 70–99)
POTASSIUM: 4 meq/L (ref 3.5–5.1)
Sodium: 139 mEq/L (ref 135–145)
Total Protein: 6.9 g/dL (ref 6.0–8.3)

## 2014-08-18 LAB — LIPID PANEL
CHOL/HDL RATIO: 3
Cholesterol: 161 mg/dL (ref 0–200)
HDL: 46.4 mg/dL (ref >39.00)
LDL CALC: 96 mg/dL (ref 0–99)
NONHDL: 114.6
Triglycerides: 95 mg/dL (ref 0.0–149.0)
VLDL: 19 mg/dL (ref 0.0–40.0)

## 2014-08-18 LAB — CBC WITH DIFFERENTIAL/PLATELET
BASOS ABS: 0 10*3/uL (ref 0.0–0.1)
Basophils Relative: 0.2 % (ref 0.0–3.0)
Eosinophils Absolute: 0.1 10*3/uL (ref 0.0–0.7)
Eosinophils Relative: 1.4 % (ref 0.0–5.0)
HEMATOCRIT: 37.5 % (ref 36.0–46.0)
Hemoglobin: 12.4 g/dL (ref 12.0–15.0)
LYMPHS PCT: 10.7 % — AB (ref 12.0–46.0)
Lymphs Abs: 0.7 10*3/uL (ref 0.7–4.0)
MCHC: 33.1 g/dL (ref 30.0–36.0)
MCV: 90.4 fl (ref 78.0–100.0)
MONOS PCT: 6.3 % (ref 3.0–12.0)
Monocytes Absolute: 0.4 10*3/uL (ref 0.1–1.0)
NEUTROS ABS: 5.3 10*3/uL (ref 1.4–7.7)
Neutrophils Relative %: 81.4 % — ABNORMAL HIGH (ref 43.0–77.0)
Platelets: 224 10*3/uL (ref 150.0–400.0)
RBC: 4.15 Mil/uL (ref 3.87–5.11)
RDW: 14.1 % (ref 11.5–15.5)
WBC: 6.5 10*3/uL (ref 4.0–10.5)

## 2014-08-18 LAB — T4, FREE: Free T4: 0.79 ng/dL (ref 0.60–1.60)

## 2014-08-18 NOTE — Progress Notes (Signed)
Subjective:    Patient ID: Kayla [name], female    DOB: 09-15-1923, 81 y.o.   MRN: [id number]  HPI Here with daughter for Medicare wellness and follow up of multiple medical issues Reviewed advanced directives Only doctor is Aundra Dubin for cardiology and eye doctor (but not seen in years) No tobacco or alcohol Unable to exercise Needs help with shower but tries to use bathroom and dress on her own. No instrumental ADLs at all Mild hearing loss. Vision is okay--right is normal, left has spot  No hospitalizations in past year. Did have echo ER eval in past year Reviewed meds Daughter and she note mild memory problems  Very limited now Stays in robe and bedclothes over the past 2 years Having more trouble breathing---struggle just getting here today Discussed changing to home visits Ongoing edema--still on demadex More bubbling and weeping--then hits them and they bleed. Soaking legs in epsom salt water Sleeps in chair--now with lift chair. noPND Hard walking  Still has great faith Does get some down times Not anhedonic  Ongoing neck pain Seems worse lately--especially last night Does get relief from the narcotic regimen. Patches and 1/2 oxycodone bid Uses massager with some relief Mild arm weakness  Has hiatal hernia Gets sense of bubbling at times Only uses gas-x No dysphagia  Current Outpatient Prescriptions on File Prior to Visit  Medication Sig Dispense Refill  . aliskiren (TEKTURNA) 300 MG tablet TAKE ONE TABLET BY MOUTH ONCE DAILY 30 tablet 3  . aspirin 81 MG tablet Take 81 mg by mouth daily.      . carvedilol (COREG) 25 MG tablet Take 25 mg by mouth daily.    Sarajane Marek Sodium (LAXATIVE PLUS STOOL SOFTENER) 30-100 MG CAPS Take 1 capsule by mouth 2 (two) times daily.     . cromolyn (NASALCROM) 5.2 MG/ACT nasal spray Place 2 sprays into the nose daily.    . fentaNYL (DURAGESIC - DOSED MCG/HR) 12 MCG/HR Place 1 patch (12.5 mcg total) onto the skin every 3  (three) days. along with the 2mcg patch 10 patch 0  . fentaNYL (DURAGESIC - DOSED MCG/HR) 25 MCG/HR patch Place 1 patch (25 mcg total) onto the skin every 3 (three) days. 10 patch 0  . hydrALAZINE (APRESOLINE) 25 MG tablet Take 1 tablet (25 mg total) by mouth 3 (three) times daily. 100 tablet 6  . lisinopril (PRINIVIL,ZESTRIL) 40 MG tablet TAKE ONE TABLET BY MOUTH ONCE DAILY 90 tablet 3  . Lutein-Zeaxanthin 6-1 MG TABS Take 1 tablet by mouth daily.      . Magnesium 400 MG CAPS Take 400 mg by mouth daily.    . Melatonin 3 MG CAPS Take 1 capsule by mouth daily.      . NONFORMULARY OR COMPOUNDED ITEM Take 5 mLs by mouth 2 (two) times daily. SOVEREIGN NANO SILVER LIQUID    . NONFORMULARY OR COMPOUNDED ITEM T3 3.1 mcg/ T4 12.5 mcg take 1 capsule by mouth daily 30 each 11  . oxyCODONE (OXY IR/ROXICODONE) 5 MG immediate release tablet Take 0.5 tablets (2.5 mg total) by mouth every 6 (six) hours as needed for severe pain. 30 tablet 0  . potassium chloride SA (KLOR-CON M20) 20 MEQ tablet TAKE ONE TABLET BY MOUTH TWICE DAILY (Patient taking differently: 20 mEq daily. TAKE ONE TABLET BY MOUTH TWICE DAILY) 60 tablet 3  . Probiotic Product (ACIDOPHILUS PROBIOTIC BLEND) CAPS Take 1 capsule by mouth daily.      . Simethicone (GAS-X MAXIMUM STRENGTH PO) Take  2 tablets by mouth daily.    Marland Kitchen torsemide (DEMADEX) 20 MG tablet Take 1 tablet (20 mg total) by mouth daily. Take an additional 20 mg on Wednesdays 35 tablet 2  . Vitamin Mixture (CHROMIUM PICOLINATE PO) Take 1 tablet by mouth daily.      No current facility-administered medications on file prior to visit.    Allergies  Allergen Reactions  . Amoxicillin-Pot Clavulanate     unknown  . Cefuroxime Axetil     unknown  . Cephalexin     Felt uncomfortable  . Levaquin [Levofloxacin In D5w]     unknown  . Sulfonamide Derivatives     unknown    Past Medical History  Diagnosis Date  . HTN (hypertension)     difficult to control  . Hypothyroidism   .  OP (osteoporosis)   . Fibromyalgia   . Edema of both legs     chronic  . Degenerative disk disease     cervical  . Follicular lymphoma     Quiescent. Dr. Fransisco Beau; Dr. Kathyrn Sheriff  . Meningioma     Left frontal  . GERD (gastroesophageal reflux disease)   . Obesity   . Cerebral hemorrhage 2004    hypertensive  . Hx: UTI (urinary tract infection)   . Renal artery aneurysm     with stenosis-right; followed by Dr. Delana Meyer  . Diastolic CHF, chronic     Echo (9-11) with EF 65-70%, mild LVH, mid AI, Mild MR, PA systolic pressure > 76HYWV    Past Surgical History  Procedure Laterality Date  . Cataract extraction  10/05 and 4/07    Left then Right [facility])  . Abdominal hysterectomy  1958  . Cerebral hemorrhage  10/2002  . Cervical fusion  1985  . Rectocele repair  1982  . Cystocele repair  1982    Family History  Problem Relation Age of Onset  . Stroke Father   . Cancer Mother     Myeloma  . Hypertension      family    History   Social History  . Marital Status: Married    Spouse Name: N/A  . Number of Children: N/A  . Years of Education: N/A   Occupational History  . Retired- Marine scientist and taught    Social History Main Topics  . Smoking status: Never Smoker   . Smokeless tobacco: Never Used     Comment: Quit smoking 50 years ago  . Alcohol Use: No  . Drug Use: No  . Sexual Activity: Not on file   Other Topics Concern  . Not on file   Social History Narrative   Has living will   Daughter Kayla [name] is health care POA   Has DNR order already-- reviewed and form redone 01/29/13   No tube feeds if cognitively unaware   Review of Systems Limited sleep --no more than 2-3 hours at a time Has full dentures Chronic urinary incontinence Appetite is not so good. Takes 1 glucerna a day (likes the reduced sugar) Bowels generally okay    Objective:   Physical Exam  Constitutional: She is oriented to person, place, and time. No distress.  HENT:  Mouth/Throat:  Oropharynx is clear and moist. No oropharyngeal exudate.  Neck: Normal range of motion. No thyromegaly present.  Cardiovascular: Normal rate, regular rhythm and normal heart sounds.  Exam reveals no gallop.   No murmur heard. Pulmonary/Chest: Effort normal and breath sounds normal. No respiratory distress. She has no wheezes. She has  no rales.  Abdominal: Soft. There is no tenderness.  Musculoskeletal:  4+ edema with small open areas distally in calves---no infection  Lymphadenopathy:    She has no cervical adenopathy.  Neurological: She is alert and oriented to person, place, and time.  President -- "Elyn Peers, ?" [phone] Recall 2/3          Assessment & Plan:

## 2014-08-18 NOTE — Assessment & Plan Note (Signed)
See social history °Has DNR °

## 2014-08-18 NOTE — Assessment & Plan Note (Signed)
Doing okay on current pain regimen

## 2014-08-18 NOTE — Assessment & Plan Note (Signed)
Worsening dyspnea Limited exercise tolerance Continues on the diuretic

## 2014-08-18 NOTE — Assessment & Plan Note (Signed)
Seems euthyroid Will recheck labs today

## 2014-08-18 NOTE — Progress Notes (Signed)
Pre visit review using our clinic review tool, if applicable. No additional management support is needed unless otherwise documented below in the visit note. 

## 2014-08-18 NOTE — Assessment & Plan Note (Signed)
No major symptoms or dysphagia Will hold off on acid blockers

## 2014-08-18 NOTE — Assessment & Plan Note (Signed)
Doesn't seem to be fluid overloaded Just slowly worsening status Will change over to home visits

## 2014-08-18 NOTE — Assessment & Plan Note (Signed)
I have personally reviewed the Medicare Annual Wellness questionnaire and have noted 1. The patient's medical and social history 2. Their use of alcohol, tobacco or illicit drugs 3. Their current medications and supplements 4. The patient's functional ability including ADL's, fall risks, home safety risks and hearing or visual             impairment. 5. Diet and physical activities 6. Evidence for depression or mood disorders  The patients weight, height, BMI and visual acuity have been recorded in the chart I have made referrals, counseling and provided education to the patient based review of the above and I have provided the pt with a written personalized care plan for preventive services.  I have provided you with a copy of your personalized plan for preventive services. Please take the time to review along with your updated medication list.  No cancer screening due to age Prefers no Td or zostavax

## 2014-08-19 ENCOUNTER — Encounter: Payer: Medicare Other | Admitting: Internal Medicine

## 2014-09-05 ENCOUNTER — Other Ambulatory Visit: Payer: Self-pay | Admitting: *Deleted

## 2014-09-05 MED ORDER — OXYCODONE HCL 5 MG PO TABS: 2.5000 mg | ORAL_TABLET | Freq: Four times a day (QID) | ORAL | Status: DC | PRN

## 2014-09-05 NOTE — Telephone Encounter (Signed)
Left message on machine that rx is ready for pick-up, and it will be at our front desk.  

## 2014-09-05 NOTE — Telephone Encounter (Signed)
Patient's daughter left a voicemail that she needs a refill on Oxycodone and she is completely out.  Kayla [name] stated that she is in town now and would like to pick it up before she goes back out into the country. Please call when ready for pickup.

## 2014-09-14 ENCOUNTER — Other Ambulatory Visit: Payer: Self-pay | Admitting: *Deleted

## 2014-09-14 MED ORDER — POTASSIUM CHLORIDE CRYS ER 20 MEQ PO TBCR: 20.0000 meq | EXTENDED_RELEASE_TABLET | Freq: Every day | ORAL | Status: AC

## 2014-09-14 MED ORDER — ALISKIREN FUMARATE 300 MG PO TABS: ORAL_TABLET | ORAL | Status: AC

## 2014-09-16 ENCOUNTER — Other Ambulatory Visit: Payer: Self-pay | Admitting: Internal Medicine

## 2014-09-16 MED ORDER — FENTANYL 12 MCG/HR TD PT72: 12.5000 ug | MEDICATED_PATCH | TRANSDERMAL | Status: DC

## 2014-09-16 MED ORDER — FENTANYL 25 MCG/HR TD PT72: 25.0000 ug | MEDICATED_PATCH | TRANSDERMAL | Status: DC

## 2014-09-16 NOTE — Telephone Encounter (Signed)
Spoke with daughter and advised results.  

## 2014-09-16 NOTE — Telephone Encounter (Signed)
Daughter needs a refill for fentanel patches.  Best number to call is [phone].  Thanks.  Also, dr Silvio Pate is doing a home visit with pt next week, can you please also talk about this?  Thanks

## 2014-09-16 NOTE — Telephone Encounter (Signed)
08/17/14 

## 2014-09-21 ENCOUNTER — Ambulatory Visit: Payer: Medicare Other | Admitting: Internal Medicine

## 2014-09-21 ENCOUNTER — Encounter: Payer: Self-pay | Admitting: Internal Medicine

## 2014-09-21 VITALS — BP 132/100 | HR 64 | Resp 24

## 2014-09-21 DIAGNOSIS — M5030 Other cervical disc degeneration, unspecified cervical region: Secondary | ICD-10-CM

## 2014-09-21 DIAGNOSIS — I890 Lymphedema, not elsewhere classified: Secondary | ICD-10-CM | POA: Diagnosis not present

## 2014-09-21 DIAGNOSIS — I270 Primary pulmonary hypertension: Secondary | ICD-10-CM

## 2014-09-21 DIAGNOSIS — I5032 Chronic diastolic (congestive) heart failure: Secondary | ICD-10-CM | POA: Diagnosis not present

## 2014-09-21 DIAGNOSIS — I10 Essential (primary) hypertension: Secondary | ICD-10-CM

## 2014-09-21 DIAGNOSIS — I272 Pulmonary hypertension, unspecified: Secondary | ICD-10-CM

## 2014-09-21 NOTE — Assessment & Plan Note (Signed)
Severe and weeping

## 2014-09-21 NOTE — Assessment & Plan Note (Signed)
Compensated but persists Has orthopnea and easy DOE Seems to be at a reasonable fluid level (at least in lungs) No med changes

## 2014-09-21 NOTE — Progress Notes (Signed)
Subjective:    Patient ID: Kayla [name], female    DOB: [birth year], 81 y.o.   MRN: [id number]  HPI Home visit for follow up of chronic medical conditions Husband and daughter are here  She is having trouble with congestion in nose Thinks it is "fungus" Uses nasacort and silver spray--not helping Mostly an issue in past month Also takes sabadil--homeopathic antihistamine  Walks in house with rollator Does go to bathroom herself--but is incontinent and uses pad Janett Billow (adopted daughter) helps her bathe, and wraps her legs. Takes about an hour to shower-- very limited  Ongoing edema Gets blisters and leaks--better with wrapping No recent redness or pain Some increase in upper leg swelling recently  Breathing is okay when sitting DOE walking around the house No regular chest pain Occasional substernal pain to back---?acid or back. Better if she repositions No dizziness   Chronic back pain Gets severe jolt at times Generally happy with the current regimen Has needed the hydrocodone bid recently Extra tylenol if not too bad Gets occasional nausea--will use dramamine  Current Outpatient Prescriptions on File Prior to Visit  Medication Sig Dispense Refill  . aliskiren (TEKTURNA) 300 MG tablet TAKE ONE TABLET BY MOUTH ONCE DAILY 30 tablet 6  . aspirin 81 MG tablet Take 81 mg by mouth daily.      . carvedilol (COREG) 25 MG tablet Take 25 mg by mouth daily.    . fentaNYL (DURAGESIC - DOSED MCG/HR) 12 MCG/HR Place 1 patch (12.5 mcg total) onto the skin every 3 (three) days. along with the 95mcg patch 10 patch 0  . fentaNYL (DURAGESIC - DOSED MCG/HR) 25 MCG/HR patch Place 1 patch (25 mcg total) onto the skin every 3 (three) days. 10 patch 0  . hydrALAZINE (APRESOLINE) 25 MG tablet Take 1 tablet (25 mg total) by mouth 3 (three) times daily. 100 tablet 6  . lisinopril (PRINIVIL,ZESTRIL) 40 MG tablet TAKE ONE TABLET BY MOUTH ONCE DAILY 90 tablet 3  . Lutein-Zeaxanthin 6-1 MG TABS  Take 1 tablet by mouth daily.      . Melatonin 3 MG CAPS Take 1 capsule by mouth daily.      . NONFORMULARY OR COMPOUNDED ITEM Place 5 mLs into the nose 2 (two) times daily. SOVEREIGN NANO SILVER LIQUID    . NONFORMULARY OR COMPOUNDED ITEM T3 3.1 mcg/ T4 12.5 mcg take 1 capsule by mouth daily 30 each 11  . oxyCODONE (OXY IR/ROXICODONE) 5 MG immediate release tablet Take 0.5 tablets (2.5 mg total) by mouth every 6 (six) hours as needed for severe pain. 30 tablet 0  . potassium chloride SA (KLOR-CON M20) 20 MEQ tablet Take 1 tablet (20 mEq total) by mouth daily. Take extra tab when you take extra Torsemide 40 tablet 6  . Probiotic Product (ACIDOPHILUS PROBIOTIC BLEND) CAPS Take 1 capsule by mouth daily.      . Simethicone (GAS-X MAXIMUM STRENGTH PO) Take 2 tablets by mouth daily.    Marland Kitchen torsemide (DEMADEX) 20 MG tablet Take 1 tablet (20 mg total) by mouth daily. Take an additional 20 mg on Wednesdays 35 tablet 2  . Vitamin Mixture (CHROMIUM PICOLINATE PO) Take 1 tablet by mouth daily.      No current facility-administered medications on file prior to visit.    Allergies  Allergen Reactions  . Amoxicillin-Pot Clavulanate     unknown  . Cefuroxime Axetil     unknown  . Cephalexin     Felt uncomfortable  . Levaquin [Levofloxacin  In D5w]     unknown  . Sulfonamide Derivatives     unknown    Past Medical History  Diagnosis Date  . HTN (hypertension)     difficult to control  . Hypothyroidism   . OP (osteoporosis)   . Fibromyalgia   . Edema of both legs     chronic  . Degenerative disk disease     cervical  . Follicular lymphoma     Quiescent. Dr. Fransisco Beau; Dr. Kathyrn Sheriff  . Meningioma     Left frontal  . GERD (gastroesophageal reflux disease)   . Obesity   . Cerebral hemorrhage 2004    hypertensive  . Hx: UTI (urinary tract infection)   . Renal artery aneurysm     with stenosis-right; followed by Dr. Delana Meyer  . Diastolic CHF, chronic     Echo (9-11) with EF 65-70%, mild LVH, mid  AI, Mild MR, PA systolic pressure > 37CHYI    Past Surgical History  Procedure Laterality Date  . Cataract extraction  10/05 and 4/07    Left then Right [facility])  . Abdominal hysterectomy  1958  . Cerebral hemorrhage  10/2002  . Cervical fusion  1985  . Rectocele repair  1982  . Cystocele repair  1982    Family History  Problem Relation Age of Onset  . Stroke Father   . Cancer Mother     Myeloma  . Hypertension      family    History   Social History  . Marital Status: Married    Spouse Name: N/A  . Number of Children: N/A  . Years of Education: N/A   Occupational History  . Retired- Marine scientist and taught    Social History Main Topics  . Smoking status: Never Smoker   . Smokeless tobacco: Never Used     Comment: Quit smoking 50 years ago  . Alcohol Use: No  . Drug Use: No  . Sexual Activity: Not on file   Other Topics Concern  . Not on file   Social History Narrative   Has living will   Daughter [name] is health care POA   Has DNR order already-- reviewed and form redone 01/29/13   No tube feeds if cognitively unaware   Review of Systems Occasional left flank pain down to leg Sleep is disjointed --in recliner. Occasionally fatigued Does have orthopnea--can't put the recliner too far back Appetite fair--- small quantities at a time Supplements with glucerna at times Bowels are okay with coconut oil     Objective:   Physical Exam  Constitutional: She appears well-developed. No distress.  Neck: Normal range of motion. Neck supple. No thyromegaly present.  Cardiovascular: Normal rate, regular rhythm and normal heart sounds.  Exam reveals no gallop.   No murmur heard. Pulmonary/Chest: Effort normal and breath sounds normal. No respiratory distress. She has no wheezes. She has no rales.  Abdominal: Soft. There is no tenderness.  Musculoskeletal:  4+ edema in calves still No overt ulcers but some weeping areas  Lymphadenopathy:    She has no cervical  adenopathy.  Psychiatric: She has a normal mood and affect. Her behavior is normal.          Assessment & Plan:

## 2014-09-21 NOTE — Assessment & Plan Note (Signed)
BP Readings from Last 3 Encounters:  09/21/14 132/100  08/18/14 146/92  04/13/14 182/80   Fair control No change for now

## 2014-09-21 NOTE — Assessment & Plan Note (Signed)
Doing okay without oxygen for now

## 2014-09-21 NOTE — Assessment & Plan Note (Signed)
Okay on her chronic narcotic regimen

## 2014-09-23 ENCOUNTER — Telehealth: Payer: Self-pay | Admitting: *Deleted

## 2014-09-23 ENCOUNTER — Telehealth: Payer: Self-pay | Admitting: Internal Medicine

## 2014-09-23 MED ORDER — OXYCODONE HCL 5 MG PO TABS: 5.0000 mg | ORAL_TABLET | Freq: Two times a day (BID) | ORAL | Status: AC | PRN

## 2014-09-23 NOTE — Telephone Encounter (Signed)
[facility] called: Nurse is going to assess pt for home health services on may 23.  Call back - [phone] if you have questions.

## 2014-09-23 NOTE — Telephone Encounter (Signed)
Spoke with daughter and advised results. Spoke with patient and advised rx ready for pick-up and it will be at the front desk.

## 2014-09-23 NOTE — Telephone Encounter (Signed)
I am not sure how long it will take [facility] to come out.  If they want, they can call and ask--- [phone]  Rx done

## 2014-09-23 NOTE — Telephone Encounter (Signed)
Daughter calling asking how long will it take hospice to respond to Dr. Alla German phone call from the last visit?  Also daughter states pt is now taking oxycodone 5 mg 2 tablets daily instead of 1/2 tablet every 6 hours prn pain. Daughter states she will run out of med on Tuesday, please advise if refill ok? Last refill 09/05/14

## 2014-09-24 NOTE — Telephone Encounter (Signed)
That sounds great- thanks! 

## 2014-09-29 ENCOUNTER — Telehealth: Payer: Self-pay | Admitting: *Deleted

## 2014-09-29 NOTE — Telephone Encounter (Signed)
That is fine to okay for her

## 2014-09-29 NOTE — Telephone Encounter (Signed)
Olegario Shearer, RN left voicemail at Triage. She is requesting nystatin mouth wash (swish and swallow) for pt due to bad thrush in mouth that is bothering her, Olegario Shearer is requesting call back

## 2014-09-29 NOTE — Telephone Encounter (Signed)
Kayla [name] returned call. She said the pharmacy phone number is [phone], if that is what is needed.

## 2014-09-29 NOTE — Telephone Encounter (Signed)
Left message for Kayla [name] to return call.

## 2014-09-30 MED ORDER — NYSTATIN 100000 UNIT/ML MT SUSP: 5.0000 mL | Freq: Four times a day (QID) | OROMUCOSAL | Status: AC

## 2014-09-30 NOTE — Telephone Encounter (Signed)
Sent. Thanks.   

## 2014-09-30 NOTE — Telephone Encounter (Signed)
Dr. Silvio Pate is out of the office.  Will you please send a prescription for oral Nystatin to [facility]?

## 2014-10-04 ENCOUNTER — Telehealth: Payer: Self-pay

## 2014-10-04 NOTE — Telephone Encounter (Signed)
I will sign when I see it

## 2014-10-04 NOTE — Telephone Encounter (Signed)
Debra with [facility] left v/m as FYI; fax was sent to Dr Silvio Pate for signing to order home health OT for bathing, transfer, and care giver training. Fax # [phone].

## 2014-10-07 ENCOUNTER — Telehealth: Payer: Self-pay | Admitting: Internal Medicine

## 2014-10-07 ENCOUNTER — Emergency Department (HOSPITAL_COMMUNITY): Payer: Medicare Other

## 2014-10-07 ENCOUNTER — Emergency Department (HOSPITAL_REHABILITATION)
Admit: 2014-10-07 | Discharge: 2014-10-07 | Disposition: A | Discharge status: Home / Self Care | Payer: Medicare Other | Attending: Emergency Medicine | Admitting: Emergency Medicine

## 2014-10-07 ENCOUNTER — Emergency Department (HOSPITAL_COMMUNITY)
Admission: EM | Admit: 2014-10-07 | Discharge: 2014-10-07 | Disposition: A | Discharge status: Home / Self Care | Payer: Medicare Other | Attending: Emergency Medicine | Admitting: Emergency Medicine

## 2014-10-07 ENCOUNTER — Encounter (HOSPITAL_COMMUNITY): Payer: Self-pay | Admitting: Cardiology

## 2014-10-07 DIAGNOSIS — Z880 Allergy status to penicillin: Secondary | ICD-10-CM | POA: Diagnosis not present

## 2014-10-07 DIAGNOSIS — E669 Obesity, unspecified: Secondary | ICD-10-CM | POA: Diagnosis not present

## 2014-10-07 DIAGNOSIS — M792 Neuralgia and neuritis, unspecified: Secondary | ICD-10-CM

## 2014-10-07 DIAGNOSIS — Z8572 Personal history of non-Hodgkin lymphomas: Secondary | ICD-10-CM | POA: Insufficient documentation

## 2014-10-07 DIAGNOSIS — Z87440 Personal history of urinary (tract) infections: Secondary | ICD-10-CM | POA: Insufficient documentation

## 2014-10-07 DIAGNOSIS — I10 Essential (primary) hypertension: Secondary | ICD-10-CM | POA: Insufficient documentation

## 2014-10-07 DIAGNOSIS — M797 Fibromyalgia: Secondary | ICD-10-CM | POA: Insufficient documentation

## 2014-10-07 DIAGNOSIS — M79609 Pain in unspecified limb: Secondary | ICD-10-CM | POA: Diagnosis not present

## 2014-10-07 DIAGNOSIS — Z86011 Personal history of benign neoplasm of the brain: Secondary | ICD-10-CM | POA: Diagnosis not present

## 2014-10-07 DIAGNOSIS — Z79899 Other long term (current) drug therapy: Secondary | ICD-10-CM | POA: Diagnosis not present

## 2014-10-07 DIAGNOSIS — I5032 Chronic diastolic (congestive) heart failure: Secondary | ICD-10-CM | POA: Insufficient documentation

## 2014-10-07 DIAGNOSIS — Z7982 Long term (current) use of aspirin: Secondary | ICD-10-CM | POA: Insufficient documentation

## 2014-10-07 DIAGNOSIS — Z8719 Personal history of other diseases of the digestive system: Secondary | ICD-10-CM | POA: Diagnosis not present

## 2014-10-07 DIAGNOSIS — M79672 Pain in left foot: Secondary | ICD-10-CM | POA: Diagnosis present

## 2014-10-07 MED ORDER — GABAPENTIN 100 MG PO CAPS: 100.0000 mg | ORAL_CAPSULE | Freq: Every day | ORAL | Status: AC

## 2014-10-07 MED ORDER — OXYCODONE-ACETAMINOPHEN 5-325 MG PO TABS
1.0000 | ORAL_TABLET | Freq: Once | ORAL | Status: AC
  Administered 2014-10-07: 1 via ORAL
  Filled 2014-10-07: qty 1

## 2014-10-07 NOTE — ED Provider Notes (Signed)
Complains of pain running from left foot to left groin worse at left foot, worse with weightbearing onset one year ago, becoming worse over the proximal past 3 weeks. On exam patient is alert nontoxic I lateral lower extremities with severe edema.. Slight oozing yellowish drainage from calf, which is chronic. Foot is tender at heel. DP pulse 2+.  Orlie Dakin, MD 10/07/14 [phone]

## 2014-10-07 NOTE — Discharge Instructions (Signed)
Thank you for allowing [place] to be involved in your healthcare while you were hospitalized at [facility].   Please note that there have been changes to your home medications.  --> PLEASE LOOK AT YOUR DISCHARGE MEDICATION LIST FOR DETAILS.  Please call your PCP if you have any questions or concerns, or any difficulty getting any of your medications.  Please return to the ER if you have worsening of your symptoms or new severe symptoms arise.  Your symptoms are likely due to nerve pain. Take gabapentin 100 mg at nighttime to help with the pain. If you tolerate this dose, you can increase it to Gabapentin 200 mg (2 pills) at night after two nights trying the 100 mg dose. Continue to take oxycodone, but take it more often if you are having pain (up to 4 times a day). Continue to use your fentanyl patches. Please call Dr. Silvio Pate next week to let him know if your pain is getting better, unchanged, or worse.  Neuropathic Pain We often think that pain has a physical cause. If we get rid of the cause, the pain should go away. Nerves themselves can also cause pain. It is called neuropathic pain, which means nerve abnormality. It may be difficult for the patients who have it and for the treating caregivers. Pain is usually described as acute (short-lived) or chronic (long-lasting). Acute pain is related to the physical sensations caused by an injury. It can last from a few seconds to many weeks, but it usually goes away when normal healing occurs. Chronic pain lasts beyond the typical healing time. With neuropathic pain, the nerve fibers themselves may be damaged or injured. They then send incorrect signals to other pain centers. The pain you feel is real, but the cause is not easy to find.  CAUSES  Chronic pain can result from diseases, such as diabetes and shingles (an infection related to chickenpox), or from trauma, surgery, or amputation. It can also happen without any known injury or  disease. The nerves are sending pain messages, even though there is no identifiable cause for such messages.   Other common causes of neuropathy include diabetes, phantom limb pain, or Regional Pain Syndrome (RPS).  As with all forms of chronic back pain, if neuropathy is not correctly treated, there can be a number of associated problems that lead to a downward cycle for the patient. These include depression, sleeplessness, feelings of fear and anxiety, limited social interaction and inability to do normal daily activities or work.  The most dramatic and mysterious example of neuropathic pain is called "phantom limb syndrome." This occurs when an arm or a leg has been removed because of illness or injury. The brain still gets pain messages from the nerves that originally carried impulses from the missing limb. These nerves now seem to misfire and cause troubling pain.  Neuropathic pain often seems to have no cause. It responds poorly to standard pain treatment. Neuropathic pain can occur after:  Shingles (herpes zoster virus infection).  A lasting burning sensation of the skin, caused usually by injury to a peripheral nerve.  Peripheral neuropathy which is widespread nerve damage, often caused by diabetes or alcoholism.  Phantom limb pain following an amputation.  Facial nerve problems (trigeminal neuralgia).  Multiple sclerosis.  Reflex sympathetic dystrophy.  Pain which comes with cancer and cancer chemotherapy.  Entrapment neuropathy such as when pressure is put on a nerve such as in carpal tunnel syndrome.  Back, leg, and hip  problems (sciatica).  Spine or back surgery.  HIV Infection or AIDS where nerves are infected by viruses. Your caregiver can explain items in the above list which may apply to you. SYMPTOMS  Characteristics of neuropathic pain are:  Severe, sharp, electric shock-like, shooting, lightening-like, knife-like.  Pins and needles sensation.  Deep  burning, deep cold, or deep ache.  Persistent numbness, tingling, or weakness.  Pain resulting from light touch or other stimulus that would not usually cause pain.  Increased sensitivity to something that would normally cause pain, such as a pinprick. Pain may persist for months or years following the healing of damaged tissues. When this happens, pain signals no longer sound an alarm about current injuries or injuries about to happen. Instead, the alarm system itself is not working correctly.  Neuropathic pain may get worse instead of better over time. For some people, it can lead to serious disability. It is important to be aware that severe injury in a limb can occur without a proper, protective pain response.Maggi Hershkowitz, cuts, and other injuries may go unnoticed. Without proper treatment, these injuries can become infected or lead to further disability. Take any injury seriously, and consult your caregiver for treatment. DIAGNOSIS  When you have a pain with no known cause, your caregiver will probably ask some specific questions:   Do you have any other conditions, such as diabetes, shingles, multiple sclerosis, or HIV infection?  How would you describe your pain? (Neuropathic pain is often described as shooting, stabbing, burning, or searing.)  Is your pain worse at any time of the day? (Neuropathic pain is usually worse at night.)  Does the pain seem to follow a certain physical pathway?  Does the pain come from an area that has missing or injured nerves? (An example would be phantom limb pain.)  Is the pain triggered by minor things such as rubbing against the sheets at night? These questions often help define the type of pain involved. Once your caregiver knows what is happening, treatment can begin. Anticonvulsant, antidepressant drugs, and various pain relievers seem to work in some cases. If another condition, such as diabetes is involved, better management of that disorder may relieve  the neuropathic pain.  TREATMENT  Neuropathic pain is frequently long-lasting and tends not to respond to treatment with narcotic type pain medication. It may respond well to other drugs such as antiseizure and antidepressant medications. Usually, neuropathic problems do not completely go away, but partial improvement is often possible with proper treatment. Your caregivers have large numbers of medications available to treat you. Do not be discouraged if you do not get immediate relief. Sometimes different medications or a combination of medications will be tried before you receive the results you are hoping for. See your caregiver if you have pain that seems to be coming from nowhere and does not go away. Help is available.  SEEK IMMEDIATE MEDICAL CARE IF:   There is a sudden change in the quality of your pain, especially if the change is on only one side of the body.  You notice changes of the skin, such as redness, black or purple discoloration, swelling, or an ulcer.  You cannot move the affected limbs. Document Released: 01/18/2004 Document Revised: 07/15/2011 Document Reviewed: 01/18/2004 [facility] Patient Information 2015 [place], [place]. This information is not intended to replace advice given to you by your health care provider. Make sure you discuss any questions you have with your health care provider.

## 2014-10-07 NOTE — Progress Notes (Signed)
*  PRELIMINARY RESULTS* Vascular Ultrasound Lower extremity venous duplex has been completed.  Preliminary findings: Very limited study due to body habitus and depth of veins. Left = Very limited views of femoral vein and popliteal vein, but no obvious DVT noted. Calf veins were not visualized.    Landry Mellow, RDMS, RVT  10/07/2014, 2:07 PM

## 2014-10-07 NOTE — Telephone Encounter (Signed)
Phone call with Dr Osa Craver -- resident in ER now Seeing her with the severe heel pain No recent trauma, etc Seems to be neuropathic but are checking x-ray and doppler prn No apparent cellulitis  Okay to try low dose gabapentin --like 100-200 at bedtime Can have daughter [name] call me in the next couple of weeks to decide about increasing --if it is helping any

## 2014-10-07 NOTE — ED Notes (Signed)
Pt to department via EMS- pt reports pain to the left foot and leg that radiates up into her back. Pt with wounds to bilateral lower legs. States that she has someone come out to her house to check the legs. Bandages noted. Bp-157/71 Hr-80

## 2014-10-07 NOTE — ED Provider Notes (Signed)
CSN: [id number]     Arrival date & time 10/07/14  1059 History   First MD Initiated Contact with Patient 10/07/14 1110     Chief Complaint  Patient presents with  . Foot Pain   HPI  Kayla [name] is an 81 yo female with PMHx of obesity, chronic edema of lower extremities, chronic dCHF, fibromyalgia, and HTN who presents with complaint of left foot pain. Patient states pain has been of gradual onset for the past 3 months. Pain occurs when weight bearing or when her heel touches the ground. Pain shoots from her heel up the back of her leg. It can be as bad as an "11/10." She describes it as sharp, shooting, tingling, like a needle poking her foot. She denies history of neuropathy or diabetes. Weight bearing makes the pain worse, pain medication and rest make the pain better.   Patient has history of chronic lower extremity edema with seeping wounds which are wrapped by a home health nurse twice a week. She denies any increased swelling, redness, pain in her lower legs. She denies fever, chills, nausea or vomiting.   Patient follows with Dr. Silvio Pate who she last saw during a home visit 4 weeks ago.   Past Medical History  Diagnosis Date  . HTN (hypertension)     difficult to control  . Hypothyroidism   . OP (osteoporosis)   . Fibromyalgia   . Edema of both legs     chronic  . Degenerative disk disease     cervical  . Follicular lymphoma     Quiescent. Dr. Fransisco Beau; Dr. Kathyrn Sheriff  . Meningioma     Left frontal  . GERD (gastroesophageal reflux disease)   . Obesity   . Cerebral hemorrhage 2004    hypertensive  . Hx: UTI (urinary tract infection)   . Renal artery aneurysm     with stenosis-right; followed by Dr. Delana Meyer  . Diastolic CHF, chronic     Echo (9-11) with EF 65-70%, mild LVH, mid AI, Mild MR, PA systolic pressure > 21JHER   Past Surgical History  Procedure Laterality Date  . Cataract extraction  10/05 and 4/07    Left then Right [facility])  . Abdominal hysterectomy  1958  .  Cerebral hemorrhage  10/2002  . Cervical fusion  1985  . Rectocele repair  1982  . Cystocele repair  1982   Family History  Problem Relation Age of Onset  . Stroke Father   . Cancer Mother     Myeloma  . Hypertension      family   History  Substance Use Topics  . Smoking status: Never Smoker   . Smokeless tobacco: Never Used     Comment: Quit smoking 50 years ago  . Alcohol Use: No   OB History    No data available     Review of Systems General: Denies fever, chills, fatigue, diaphoresis.  Respiratory: Denies SOB, cough, DOE   Cardiovascular: Denies chest pain and palpitations.  Gastrointestinal: Denies nausea, vomiting Musculoskeletal: Denies myalgias, back pain, joint swelling, arthralgias and gait problem.  Skin: Admits to chronic lower extremity edema with chronic wounds without change in size, redness or warmth.  Neurological: Admits to sharp shooting pain from left heel up left leg. Denies dizziness, headaches, weakness.  Allergies  Amoxicillin-pot clavulanate; Cefuroxime axetil; Cephalexin; Levaquin; and Sulfonamide derivatives  Home Medications   Prior to Admission medications   Medication Sig Start Date End Date Taking? Authorizing Provider  aliskiren (TEKTURNA) 300  MG tablet TAKE ONE TABLET BY MOUTH ONCE DAILY 09/14/14   Larey Dresser, MD  aspirin 81 MG tablet Take 81 mg by mouth daily.      Historical Provider, MD  carvedilol (COREG) 25 MG tablet Take 25 mg by mouth daily. 03/22/13   Wellington Hampshire, MD  fentaNYL (DURAGESIC - DOSED MCG/HR) 12 MCG/HR Place 1 patch (12.5 mcg total) onto the skin every 3 (three) days. along with the 68mcg patch 09/16/14   Venia Carbon, MD  fentaNYL (DURAGESIC - DOSED MCG/HR) 25 MCG/HR patch Place 1 patch (25 mcg total) onto the skin every 3 (three) days. 09/16/14   Venia Carbon, MD  gabapentin (NEURONTIN) 100 MG capsule Take 1 capsule (100 mg total) by mouth at bedtime. 10/07/14   Elston Aldape Sherral Hammers, MD  hydrALAZINE  (APRESOLINE) 25 MG tablet Take 1 tablet (25 mg total) by mouth 3 (three) times daily. 03/30/14   Wellington Hampshire, MD  lisinopril (PRINIVIL,ZESTRIL) 40 MG tablet TAKE ONE TABLET BY MOUTH ONCE DAILY 05/02/14   Wellington Hampshire, MD  Lutein-Zeaxanthin 6-1 MG TABS Take 1 tablet by mouth daily.      Historical Provider, MD  Melatonin 3 MG CAPS Take 1 capsule by mouth daily.      Historical Provider, MD  NONFORMULARY OR COMPOUNDED ITEM Place 5 mLs into the nose 2 (two) times daily. [place]    Historical Provider, MD  NONFORMULARY OR COMPOUNDED ITEM T3 3.1 mcg/ T4 12.5 mcg take 1 capsule by mouth daily 05/10/14   Venia Carbon, MD  nystatin (MYCOSTATIN) 100000 UNIT/ML suspension Take 5 mLs (500,000 Units total) by mouth 4 (four) times daily. Use until resolved and then for 2 more days additionally. 09/30/14   Tonia Ghent, MD  oxyCODONE (OXY IR/ROXICODONE) 5 MG immediate release tablet Take 1 tablet (5 mg total) by mouth 2 (two) times daily as needed for severe pain. 09/23/14   Venia Carbon, MD  potassium chloride SA (KLOR-CON M20) 20 MEQ tablet Take 1 tablet (20 mEq total) by mouth daily. Take extra tab when you take extra Torsemide 09/14/14   Larey Dresser, MD  Probiotic Product (ACIDOPHILUS PROBIOTIC BLEND) CAPS Take 1 capsule by mouth daily.      Historical Provider, MD  sennosides-docusate sodium (SENOKOT-S) 8.6-50 MG tablet Take 2 tablets by mouth daily.    Historical Provider, MD  Simethicone (GAS-X MAXIMUM STRENGTH PO) Take 2 tablets by mouth daily.    Historical Provider, MD  torsemide (DEMADEX) 20 MG tablet Take 1 tablet (20 mg total) by mouth daily. Take an additional 20 mg on Wednesdays 07/25/14   Jolaine Artist, MD  triamcinolone (NASACORT) 55 MCG/ACT AERO nasal inhaler Place 2 sprays into the nose daily.    Historical Provider, MD  Vitamin Mixture (CHROMIUM PICOLINATE PO) Take 1 tablet by mouth daily.     Historical Provider, MD   Physical Exam  Filed Vitals:    10/07/14 1423 10/07/14 1430 10/07/14 1500 10/07/14 1530  BP: 158/79 151/92 156/77 156/77  Pulse: 69 67 68 70  Temp:      TempSrc:      Resp: 18   20  SpO2: 91% 97% 89% 88%   General: Vital signs reviewed.  Patient is obese, in no acute distress and cooperative with exam.  Cardiovascular: RRR, S1 normal, S2 normal, 2/6 systolic ejection murmur Pulmonary/Chest: Clear to auscultation bilaterally, no wheezes, rales, or rhonchi. Abdominal: Soft, obese, non-tender, non-distended, BS +  Musculoskeletal: No joint deformities, erythema, or stiffness, ROM full and nontender. Extremities: 3+ lower extremity edema bilaterally with chronic venous stasis changes, pulses symmetric and intact bilaterally. Mild erythema, drainage, non-tender, unchanged from prior.   Neurological: Tenderness on palpation of plantar heel with radiation of pain. Sensory intact to light touch bilaterally.  Psychiatric: Normal mood and affect. Speech and behavior is normal. Cognition and memory are normal.   ED Course  Procedures (including critical care time) Labs Review Labs Reviewed - No data to display  Imaging Review Dg Foot Complete Left  10/07/2014   CLINICAL DATA:  Left foot pain.  Lymph edema of the leg.  EXAM: LEFT FOOT - COMPLETE 3+ VIEW  COMPARISON:  None.  FINDINGS: Moderate osteopenia is present. There is fusion across the first, second, third, and fourth TMT joints. Inter [place] she will joints are fused in the third and fourth digits. No acute fracture is present. Extensive soft tissue swelling is likely related to lymphedema. Vascular calcifications are noted as well.  IMPRESSION: 1. Extensive soft tissue swelling is likely related to lymphedema. No discrete mass is present. 2. Atherosclerotic changes typical of diabetes. 3. Marked osteopenia without acute abnormality of the bones.   Electronically Signed   By: San Morelle M.D.   On: 10/07/2014 13:07     EKG Interpretation None      MDM   Final  diagnoses:  Neuropathic pain of foot, left   Ms. Stewart is an 81 yo female with PMHx of obesity, chronic edema of lower extremities, chronic dCHF, fibromyalgia, and HTN who presents with complaint of sharp shooting pain from left heel up her posterior leg with weight bearing. Pain is chronic but worsening and likely neuropathic in nature. This was discussed with her primary care physician, Dr. Silvio Pate, who agreed with the plan below.  Will rule out DVT with venous doppler study. Will check left foot xray to r/o trauma. Will give percocet 5-325 mg for pain control once in the ED. Patient is to continue fentanyl patches at home, along with oxycodone 5 mg QID prn for pain. We will also prescribe Gabapentin 100 mg QHS with gradual increase to 200 mg QHS as tolerated. Patient's daughter is to call Dr. Silvio Pate to discuss if gabapentin and increased oxycodone is helping with the pain. Daughter and patient voiced understanding of plan.   Left foot xray showed extensive soft tissue swelling is likely related to lymphedema. No discrete mass is present. Atherosclerotic changes typical of diabetes. Marked osteopenia without acute abnormality of the bones.  Preliminary result of venous ultrasound showed very limited study due to body habitus and depth of veins and very limited views of femoral vein and popliteal vein, but no obvious DVT noted. Calf veins were not visualized.  Of note, patient's O2 saturations fell to 88-91% in the ED, but would increase to 94-96% at rest without talking. Daughter and patient are aware of this in the past. I recommended to the patient and daughter that this be further addressed with her PCP and cardiologist as soon as possible. During stay, patient remained comfortable and was able to speak in full symptoms without any complaint of shortness of breath, wheezing or distress.   Patient is safe for discharge with follow up with PCP. This plan was discussed in full with Dr.  Winfred Leeds.  Osa Craver, DO PGY-1 Internal Medicine Resident Pager # [phone] 10/07/2014 4:22 PM     Roxine Caddy Sherral Hammers, MD 10/07/14 [place], MD 10/07/14 1649

## 2014-10-07 NOTE — Telephone Encounter (Signed)
Will await results of ED visit

## 2014-10-07 NOTE — Telephone Encounter (Signed)
Patient Name: Kayla [name] DOB: [birth year] Initial Comment Caller states mother has a lot of pain in left leg, has had 2 500mg  tylenol and oxycodone. No other sx. Nurse Assessment Nurse: Marcelline Deist, RN, Lynda Date/Time (Eastern Time): 10/07/2014 9:13:10 AM Confirm and document reason for call. If symptomatic, describe symptoms. ---Caller states mother has a lot of pain in left leg, has had 2 500 mg Tylenol and Oxycodone. No other symptoms. She has had this leg pain, but has become worse. She is on a home visit list, saw Dr. in mid May. The pain goes from hip to bottom of foot. Almost collapsed d/t pain this am. Has the patient traveled out of the country within the last 30 days? ---Not Applicable Does the patient require triage? ---Yes Related visit to physician within the last 2 weeks? ---No Does the PT have any chronic conditions? (i.e. diabetes, asthma, etc.) ---Yes List chronic conditions. ---fibromyalgia - on Fentanyl patch, leg pain/edema, CHF, incontinent Guidelines Guideline Title Affirmed Question Affirmed Notes Leg Pain Unable to walk Final Disposition User Go to ED Now Marcelline Deist, RN, Lynda Comments Caller states they will call for EMS transport to hospital as she is unable to walk d/t level of pain in her leg.

## 2014-10-10 ENCOUNTER — Telehealth (HOSPITAL_COMMUNITY): Payer: Self-pay | Admitting: *Deleted

## 2014-10-10 DIAGNOSIS — I5032 Chronic diastolic (congestive) heart failure: Secondary | ICD-10-CM | POA: Diagnosis not present

## 2014-10-10 DIAGNOSIS — R262 Difficulty in walking, not elsewhere classified: Secondary | ICD-10-CM | POA: Diagnosis not present

## 2014-10-10 DIAGNOSIS — M5030 Other cervical disc degeneration, unspecified cervical region: Secondary | ICD-10-CM | POA: Diagnosis not present

## 2014-10-10 DIAGNOSIS — I890 Lymphedema, not elsewhere classified: Secondary | ICD-10-CM | POA: Diagnosis not present

## 2014-10-10 NOTE — Telephone Encounter (Signed)
Prior Authorization for Torsemide approved from 07/28/14- 07/28/15. Faxed to pharmacy.

## 2014-10-12 ENCOUNTER — Telehealth: Payer: Self-pay | Admitting: *Deleted

## 2014-10-12 NOTE — Telephone Encounter (Signed)
Daughter calling because her mother is in extreme pain and she is asking for help.  Marland Kitchenleft message to have patient's daughter return my call.

## 2014-10-14 ENCOUNTER — Inpatient Hospital Stay (HOSPITAL_COMMUNITY)
Admission: EM | Admit: 2014-10-14 | Discharge: 2014-11-04 | DRG: 189 | Disposition: E | Payer: Medicare Other | Attending: Internal Medicine | Admitting: Internal Medicine

## 2014-10-14 ENCOUNTER — Encounter (HOSPITAL_COMMUNITY): Payer: Self-pay | Admitting: Emergency Medicine

## 2014-10-14 ENCOUNTER — Emergency Department (HOSPITAL_COMMUNITY): Payer: Medicare Other

## 2014-10-14 DIAGNOSIS — F419 Anxiety disorder, unspecified: Secondary | ICD-10-CM | POA: Diagnosis present

## 2014-10-14 DIAGNOSIS — R197 Diarrhea, unspecified: Secondary | ICD-10-CM | POA: Diagnosis not present

## 2014-10-14 DIAGNOSIS — I5032 Chronic diastolic (congestive) heart failure: Secondary | ICD-10-CM | POA: Diagnosis present

## 2014-10-14 DIAGNOSIS — I890 Lymphedema, not elsewhere classified: Secondary | ICD-10-CM

## 2014-10-14 DIAGNOSIS — J9811 Atelectasis: Secondary | ICD-10-CM | POA: Diagnosis present

## 2014-10-14 DIAGNOSIS — E872 Acidosis: Secondary | ICD-10-CM | POA: Diagnosis present

## 2014-10-14 DIAGNOSIS — Z7982 Long term (current) use of aspirin: Secondary | ICD-10-CM | POA: Diagnosis not present

## 2014-10-14 DIAGNOSIS — I10 Essential (primary) hypertension: Secondary | ICD-10-CM | POA: Diagnosis present

## 2014-10-14 DIAGNOSIS — Z981 Arthrodesis status: Secondary | ICD-10-CM

## 2014-10-14 DIAGNOSIS — R001 Bradycardia, unspecified: Secondary | ICD-10-CM | POA: Diagnosis not present

## 2014-10-14 DIAGNOSIS — K219 Gastro-esophageal reflux disease without esophagitis: Secondary | ICD-10-CM | POA: Diagnosis present

## 2014-10-14 DIAGNOSIS — C8290 Follicular lymphoma, unspecified, unspecified site: Secondary | ICD-10-CM | POA: Diagnosis present

## 2014-10-14 DIAGNOSIS — I872 Venous insufficiency (chronic) (peripheral): Secondary | ICD-10-CM | POA: Diagnosis present

## 2014-10-14 DIAGNOSIS — K5900 Constipation, unspecified: Secondary | ICD-10-CM | POA: Diagnosis present

## 2014-10-14 DIAGNOSIS — G9340 Encephalopathy, unspecified: Secondary | ICD-10-CM | POA: Diagnosis present

## 2014-10-14 DIAGNOSIS — L0390 Cellulitis, unspecified: Secondary | ICD-10-CM | POA: Diagnosis present

## 2014-10-14 DIAGNOSIS — E875 Hyperkalemia: Secondary | ICD-10-CM | POA: Diagnosis not present

## 2014-10-14 DIAGNOSIS — J189 Pneumonia, unspecified organism: Secondary | ICD-10-CM | POA: Diagnosis present

## 2014-10-14 DIAGNOSIS — Z515 Encounter for palliative care: Secondary | ICD-10-CM

## 2014-10-14 DIAGNOSIS — B370 Candidal stomatitis: Secondary | ICD-10-CM | POA: Diagnosis present

## 2014-10-14 DIAGNOSIS — M810 Age-related osteoporosis without current pathological fracture: Secondary | ICD-10-CM | POA: Diagnosis present

## 2014-10-14 DIAGNOSIS — N179 Acute kidney failure, unspecified: Secondary | ICD-10-CM | POA: Diagnosis present

## 2014-10-14 DIAGNOSIS — J9601 Acute respiratory failure with hypoxia: Secondary | ICD-10-CM | POA: Diagnosis present

## 2014-10-14 DIAGNOSIS — E662 Morbid (severe) obesity with alveolar hypoventilation: Secondary | ICD-10-CM | POA: Diagnosis present

## 2014-10-14 DIAGNOSIS — J9602 Acute respiratory failure with hypercapnia: Secondary | ICD-10-CM

## 2014-10-14 DIAGNOSIS — G4733 Obstructive sleep apnea (adult) (pediatric): Secondary | ICD-10-CM | POA: Diagnosis present

## 2014-10-14 DIAGNOSIS — D329 Benign neoplasm of meninges, unspecified: Secondary | ICD-10-CM | POA: Diagnosis present

## 2014-10-14 DIAGNOSIS — Z6841 Body Mass Index (BMI) 40.0 and over, adult: Secondary | ICD-10-CM | POA: Diagnosis not present

## 2014-10-14 DIAGNOSIS — M797 Fibromyalgia: Secondary | ICD-10-CM | POA: Diagnosis present

## 2014-10-14 DIAGNOSIS — F1123 Opioid dependence with withdrawal: Secondary | ICD-10-CM | POA: Diagnosis present

## 2014-10-14 DIAGNOSIS — I270 Primary pulmonary hypertension: Secondary | ICD-10-CM

## 2014-10-14 DIAGNOSIS — J9621 Acute and chronic respiratory failure with hypoxia: Secondary | ICD-10-CM | POA: Diagnosis present

## 2014-10-14 DIAGNOSIS — I272 Other secondary pulmonary hypertension: Secondary | ICD-10-CM | POA: Diagnosis present

## 2014-10-14 DIAGNOSIS — I959 Hypotension, unspecified: Secondary | ICD-10-CM | POA: Diagnosis not present

## 2014-10-14 DIAGNOSIS — Z66 Do not resuscitate: Secondary | ICD-10-CM | POA: Diagnosis present

## 2014-10-14 DIAGNOSIS — Z79899 Other long term (current) drug therapy: Secondary | ICD-10-CM | POA: Diagnosis not present

## 2014-10-14 DIAGNOSIS — R0602 Shortness of breath: Secondary | ICD-10-CM | POA: Diagnosis present

## 2014-10-14 DIAGNOSIS — N289 Disorder of kidney and ureter, unspecified: Secondary | ICD-10-CM

## 2014-10-14 DIAGNOSIS — G894 Chronic pain syndrome: Secondary | ICD-10-CM | POA: Diagnosis present

## 2014-10-14 DIAGNOSIS — E039 Hypothyroidism, unspecified: Secondary | ICD-10-CM | POA: Diagnosis present

## 2014-10-14 DIAGNOSIS — M722 Plantar fascial fibromatosis: Secondary | ICD-10-CM | POA: Diagnosis present

## 2014-10-14 DIAGNOSIS — J9622 Acute and chronic respiratory failure with hypercapnia: Secondary | ICD-10-CM | POA: Diagnosis present

## 2014-10-14 DIAGNOSIS — N390 Urinary tract infection, site not specified: Secondary | ICD-10-CM | POA: Diagnosis present

## 2014-10-14 LAB — COMPREHENSIVE METABOLIC PANEL
ALK PHOS: 45 U/L (ref 38–126)
ALT: 13 U/L — ABNORMAL LOW (ref 14–54)
AST: 16 U/L (ref 15–41)
Albumin: 3.1 g/dL — ABNORMAL LOW (ref 3.5–5.0)
Anion gap: 6 (ref 5–15)
BILIRUBIN TOTAL: 0.5 mg/dL (ref 0.3–1.2)
BUN: 26 mg/dL — AB (ref 6–20)
CO2: 26 mmol/L (ref 22–32)
CREATININE: 1.34 mg/dL — AB (ref 0.44–1.00)
Calcium: 9.8 mg/dL (ref 8.9–10.3)
Chloride: 107 mmol/L (ref 101–111)
GFR calc non Af Amer: 34 mL/min — ABNORMAL LOW (ref >60)
GFR, EST AFRICAN AMERICAN: 39 mL/min — AB (ref >60)
Glucose, Bld: 119 mg/dL — ABNORMAL HIGH (ref 65–99)
Potassium: 4.9 mmol/L (ref 3.5–5.1)
SODIUM: 139 mmol/L (ref 135–145)
TOTAL PROTEIN: 5.8 g/dL — AB (ref 6.5–8.1)

## 2014-10-14 LAB — I-STAT ARTERIAL BLOOD GAS, ED
Bicarbonate: 27 mEq/L — ABNORMAL HIGH (ref 20.0–24.0)
O2 Saturation: 97 %
PCO2 ART: 54.1 mmHg — AB (ref 35.0–45.0)
Patient temperature: 97.6
TCO2: 29 mmol/L (ref 0–100)
pH, Arterial: 7.304 — ABNORMAL LOW (ref 7.350–7.450)
pO2, Arterial: 95 mmHg (ref 80.0–100.0)

## 2014-10-14 LAB — CBC WITH DIFFERENTIAL/PLATELET
Basophils Absolute: 0 10*3/uL (ref 0.0–0.1)
Basophils Relative: 1 % (ref 0–1)
Eosinophils Absolute: 0.2 10*3/uL (ref 0.0–0.7)
Eosinophils Relative: 2 % (ref 0–5)
HEMATOCRIT: 33.8 % — AB (ref 36.0–46.0)
Hemoglobin: 10.3 g/dL — ABNORMAL LOW (ref 12.0–15.0)
LYMPHS PCT: 9 % — AB (ref 12–46)
Lymphs Abs: 0.7 10*3/uL (ref 0.7–4.0)
MCH: 28.5 pg (ref 26.0–34.0)
MCHC: 30.5 g/dL (ref 30.0–36.0)
MCV: 93.4 fL (ref 78.0–100.0)
Monocytes Absolute: 0.9 10*3/uL (ref 0.1–1.0)
Monocytes Relative: 12 % (ref 3–12)
Neutro Abs: 5.4 10*3/uL (ref 1.7–7.7)
Neutrophils Relative %: 76 % (ref 43–77)
Platelets: 201 10*3/uL (ref 150–400)
RBC: 3.62 MIL/uL — AB (ref 3.87–5.11)
RDW: 14.7 % (ref 11.5–15.5)
WBC: 7.1 10*3/uL (ref 4.0–10.5)

## 2014-10-14 LAB — I-STAT TROPONIN, ED: Troponin i, poc: 0.01 ng/mL (ref 0.00–0.08)

## 2014-10-14 LAB — BRAIN NATRIURETIC PEPTIDE: B Natriuretic Peptide: 212.6 pg/mL — ABNORMAL HIGH (ref 0.0–100.0)

## 2014-10-14 MED ORDER — OXYCODONE HCL 5 MG PO TABS
5.0000 mg | ORAL_TABLET | Freq: Two times a day (BID) | ORAL | Status: DC | PRN
  Administered 2014-10-15 – 2014-10-16 (×2): 5 mg via ORAL
  Filled 2014-10-14 (×2): qty 1

## 2014-10-14 MED ORDER — ALISKIREN FUMARATE 150 MG PO TABS
300.0000 mg | ORAL_TABLET | Freq: Every day | ORAL | Status: DC
  Filled 2014-10-14: qty 2

## 2014-10-14 MED ORDER — FENTANYL 25 MCG/HR TD PT72: 25.0000 ug | MEDICATED_PATCH | TRANSDERMAL | Status: AC

## 2014-10-14 MED ORDER — TORSEMIDE 20 MG PO TABS
20.0000 mg | ORAL_TABLET | Freq: Every day | ORAL | Status: DC
  Filled 2014-10-14: qty 1

## 2014-10-14 MED ORDER — GABAPENTIN 100 MG PO CAPS
100.0000 mg | ORAL_CAPSULE | Freq: Every day | ORAL | Status: DC
  Administered 2014-10-15: 100 mg via ORAL
  Filled 2014-10-14 (×2): qty 1

## 2014-10-14 MED ORDER — HYDRALAZINE HCL 25 MG PO TABS
25.0000 mg | ORAL_TABLET | Freq: Three times a day (TID) | ORAL | Status: DC
  Administered 2014-10-15: 25 mg via ORAL
  Filled 2014-10-14 (×5): qty 1

## 2014-10-14 MED ORDER — ASPIRIN EC 81 MG PO TBEC
81.0000 mg | DELAYED_RELEASE_TABLET | Freq: Every day | ORAL | Status: DC
  Administered 2014-10-15 – 2014-10-23 (×9): 81 mg via ORAL
  Filled 2014-10-14 (×11): qty 1

## 2014-10-14 MED ORDER — TRIAMCINOLONE ACETONIDE 55 MCG/ACT NA AERO
2.0000 | INHALATION_SPRAY | Freq: Every day | NASAL | Status: DC
  Administered 2014-10-15 – 2014-10-23 (×5): 2 via NASAL
  Filled 2014-10-14 (×3): qty 21.6

## 2014-10-14 MED ORDER — MELATONIN 3 MG PO TABS
3.0000 mg | ORAL_TABLET | Freq: Every day | ORAL | Status: DC
  Administered 2014-10-15: 3 mg via ORAL
  Filled 2014-10-14 (×2): qty 1

## 2014-10-14 MED ORDER — SODIUM CHLORIDE 0.9 % IJ SOLN
3.0000 mL | Freq: Two times a day (BID) | INTRAMUSCULAR | Status: DC
  Administered 2014-10-15 – 2014-10-22 (×13): 3 mL via INTRAVENOUS

## 2014-10-14 MED ORDER — HEPARIN SODIUM (PORCINE) 5000 UNIT/ML IJ SOLN
5000.0000 [IU] | Freq: Three times a day (TID) | INTRAMUSCULAR | Status: DC
  Administered 2014-10-15: 5000 [IU] via SUBCUTANEOUS
  Filled 2014-10-14 (×7): qty 1

## 2014-10-14 MED ORDER — FENTANYL 12 MCG/HR TD PT72: 12.5000 ug | MEDICATED_PATCH | TRANSDERMAL | Status: AC

## 2014-10-14 MED ORDER — ONDANSETRON HCL 4 MG/2ML IJ SOLN: 4.0000 mg | Freq: Four times a day (QID) | INTRAMUSCULAR | Status: DC | PRN

## 2014-10-14 MED ORDER — SENNOSIDES-DOCUSATE SODIUM 8.6-50 MG PO TABS
2.0000 | ORAL_TABLET | Freq: Every day | ORAL | Status: DC
  Administered 2014-10-15 – 2014-10-23 (×7): 2 via ORAL
  Filled 2014-10-14 (×11): qty 2

## 2014-10-14 MED ORDER — SODIUM CHLORIDE 0.9 % IJ SOLN
3.0000 mL | INTRAMUSCULAR | Status: DC | PRN
  Administered 2014-10-16: 3 mL via INTRAVENOUS
  Filled 2014-10-14: qty 3

## 2014-10-14 MED ORDER — ACETAMINOPHEN 325 MG PO TABS
650.0000 mg | ORAL_TABLET | ORAL | Status: DC | PRN
Start: 2014-10-14 — End: 2014-10-24

## 2014-10-14 MED ORDER — CARVEDILOL 25 MG PO TABS
25.0000 mg | ORAL_TABLET | Freq: Every day | ORAL | Status: DC
  Filled 2014-10-14 (×2): qty 1

## 2014-10-14 MED ORDER — FENTANYL 25 MCG/HR TD PT72
25.0000 ug | MEDICATED_PATCH | TRANSDERMAL | Status: DC
Start: 2014-10-14 — End: 2014-10-15

## 2014-10-14 MED ORDER — FENTANYL 12 MCG/HR TD PT72
12.5000 ug | MEDICATED_PATCH | TRANSDERMAL | Status: DC
Start: 2014-10-17 — End: 2014-10-15

## 2014-10-14 MED ORDER — SODIUM CHLORIDE 0.9 % IV SOLN: 250.0000 mL | INTRAVENOUS | Status: DC | PRN

## 2014-10-14 MED ORDER — NYSTATIN 100000 UNIT/ML MT SUSP
5.0000 mL | Freq: Three times a day (TID) | OROMUCOSAL | Status: DC
  Administered 2014-10-15 – 2014-10-23 (×28): 500000 [IU] via ORAL
  Filled 2014-10-14 (×44): qty 5

## 2014-10-14 NOTE — ED Provider Notes (Signed)
CSN: [id number]     Arrival date & time 10/30/2014  1902 History   First MD Initiated Contact with Patient 10/25/2014 1912     Chief Complaint  Patient presents with  . Leg Pain     (Consider location/radiation/quality/duration/timing/severity/associated sxs/prior Treatment) Patient is a 81 y.o. female presenting with leg pain. The history is provided by the patient.  Leg Pain Location:  Foot, leg and hip Hip location:  L hip Leg location:  L leg Foot location:  L foot Pain details:    Quality:  Sharp   Radiates to:  Back and groin (to the base of her head)   Severity:  Moderate   Onset quality:  Gradual   Duration:  12 months   Timing:  Constant   Progression:  Worsening Chronicity:  Chronic (daughter called ambulance) Prior injury to area:  No Relieved by:  Nothing Worsened by:  Nothing tried Ineffective treatments:  None tried Associated symptoms: fatigue and swelling (chronic b/l legs)   Associated symptoms: no decreased ROM and no fever   Risk factors comment:  Taking 15 mg oxy daily on top of fentanyl patches   Past Medical History  Diagnosis Date  . HTN (hypertension)     difficult to control  . Hypothyroidism   . OP (osteoporosis)   . Fibromyalgia   . Edema of both legs     chronic  . Degenerative disk disease     cervical  . Follicular lymphoma     Quiescent. Dr. Fransisco Beau; Dr. Kathyrn Sheriff  . Meningioma     Left frontal  . GERD (gastroesophageal reflux disease)   . Obesity   . Cerebral hemorrhage 2004    hypertensive  . Hx: UTI (urinary tract infection)   . Renal artery aneurysm     with stenosis-right; followed by Dr. Delana Meyer  . Diastolic CHF, chronic     Echo (9-11) with EF 65-70%, mild LVH, mid AI, Mild MR, PA systolic pressure > 22WLNL   Past Surgical History  Procedure Laterality Date  . Cataract extraction  10/05 and 4/07    Left then Right [facility])  . Abdominal hysterectomy  1958  . Cerebral hemorrhage  10/2002  . Cervical fusion  1985  .  Rectocele repair  1982  . Cystocele repair  1982   Family History  Problem Relation Age of Onset  . Stroke Father   . Cancer Mother     Myeloma  . Hypertension      family   History  Substance Use Topics  . Smoking status: Never Smoker   . Smokeless tobacco: Never Used     Comment: Quit smoking 50 years ago  . Alcohol Use: No   OB History    No data available     Review of Systems  Constitutional: Positive for fatigue. Negative for fever.  All other systems reviewed and are negative.     Allergies  Amoxicillin-pot clavulanate; Augmentin; Cefuroxime axetil; Cephalexin; Levaquin; and Sulfonamide derivatives  Home Medications   Prior to Admission medications   Medication Sig Start Date End Date Taking? Authorizing Provider  aliskiren (TEKTURNA) 300 MG tablet TAKE ONE TABLET BY MOUTH ONCE DAILY 09/14/14   Larey Dresser, MD  aspirin 81 MG tablet Take 81 mg by mouth daily.      Historical Provider, MD  carvedilol (COREG) 25 MG tablet Take 25 mg by mouth daily. 03/22/13   Wellington Hampshire, MD  fentaNYL (DURAGESIC - DOSED MCG/HR) 12 MCG/HR Place 1  patch (12.5 mcg total) onto the skin every 3 (three) days. along with the 36mcg patch 10/27/2014   Ria Bush, MD  fentaNYL (DURAGESIC - DOSED MCG/HR) 25 MCG/HR patch Place 1 patch (25 mcg total) onto the skin every 3 (three) days. 10/18/2014   Ria Bush, MD  gabapentin (NEURONTIN) 100 MG capsule Take 1 capsule (100 mg total) by mouth at bedtime. 10/07/14   Alexa Sherral Hammers, MD  hydrALAZINE (APRESOLINE) 25 MG tablet Take 1 tablet (25 mg total) by mouth 3 (three) times daily. 03/30/14   Wellington Hampshire, MD  lisinopril (PRINIVIL,ZESTRIL) 40 MG tablet TAKE ONE TABLET BY MOUTH ONCE DAILY 05/02/14   Wellington Hampshire, MD  Lutein-Zeaxanthin 6-1 MG TABS Take 1 tablet by mouth daily.      Historical Provider, MD  Melatonin 3 MG CAPS Take 1 capsule by mouth daily.      Historical Provider, MD  NONFORMULARY OR COMPOUNDED ITEM Place 5 mLs  into the nose 2 (two) times daily. [place]    Historical Provider, MD  NONFORMULARY OR COMPOUNDED ITEM T3 3.1 mcg/ T4 12.5 mcg take 1 capsule by mouth daily 05/10/14   Venia Carbon, MD  nystatin (MYCOSTATIN) 100000 UNIT/ML suspension Take 5 mLs (500,000 Units total) by mouth 4 (four) times daily. Use until resolved and then for 2 more days additionally. 09/30/14   Tonia Ghent, MD  oxyCODONE (OXY IR/ROXICODONE) 5 MG immediate release tablet Take 1 tablet (5 mg total) by mouth 2 (two) times daily as needed for severe pain. 09/23/14   Venia Carbon, MD  potassium chloride SA (KLOR-CON M20) 20 MEQ tablet Take 1 tablet (20 mEq total) by mouth daily. Take extra tab when you take extra Torsemide 09/14/14   Larey Dresser, MD  Probiotic Product (ACIDOPHILUS PROBIOTIC BLEND) CAPS Take 1 capsule by mouth daily.      Historical Provider, MD  sennosides-docusate sodium (SENOKOT-S) 8.6-50 MG tablet Take 2 tablets by mouth daily.    Historical Provider, MD  Simethicone (GAS-X MAXIMUM STRENGTH PO) Take 2 tablets by mouth daily.    Historical Provider, MD  torsemide (DEMADEX) 20 MG tablet Take 1 tablet (20 mg total) by mouth daily. Take an additional 20 mg on Wednesdays 07/25/14   Jolaine Artist, MD  triamcinolone (NASACORT) 55 MCG/ACT AERO nasal inhaler Place 2 sprays into the nose daily.    Historical Provider, MD  Vitamin Mixture (CHROMIUM PICOLINATE PO) Take 1 tablet by mouth daily.     Historical Provider, MD   BP 94/54 mmHg  Pulse 62  Temp(Src) 97.6 F (36.4 C) (Oral)  Resp 24  SpO2 97% Physical Exam  Constitutional: She is oriented to person, place, and time. She appears well-developed and well-nourished. No distress.  HENT:  Head: Normocephalic.  Off of O2 lips become slightly cyanotic  Eyes: Conjunctivae are normal.  Neck: Neck supple. No tracheal deviation present.  Cardiovascular: Normal rate and regular rhythm.  Exam reveals distant heart sounds.    Pulmonary/Chest: Tachypnea (with short sentences) noted. No respiratory distress. She has rales (bibasilar).  Abdominal: Soft. She exhibits no distension.  Musculoskeletal:  Severe, chronic appearing b/l LE edema with weekping ulceration and erythema. No fluctuance, no induration. Tenderness of plantar left foot.  Neurological: She is alert and oriented to person, place, and time. GCS eye subscore is 4. GCS verbal subscore is 5. GCS motor subscore is 6.  Skin: Skin is warm and dry.  Psychiatric: She has a normal mood  and affect.    ED Course  Procedures (including critical care time) Labs Review Labs Reviewed - No data to display  Imaging Review No results found.   EKG Interpretation None      MDM   Final diagnoses:  None    81 year old female presents with a chief complaint of left leg pain that has been worsening over the last several weeks. She is on 2 that no patches for chronic pain and has oxycodone for breakthrough. She has been taking more of this medication recently up to 50 mg daily on top of her transdermal patches.  Patient had a previous visit here where DVT was ruled out and musculoskeletal injury of the left lower extremity was felt unlikely after plain films. Her symptoms have remained practically unchanged but intensity of pain is worsened over the last 48 hours prompting an increase in pain medication.  Patient is hypoxemic on room air to the low 80s at rest with short sentences in speech. Possible etiologies include heart failure exacerbation versus overmedication with narcotics but the patient does not have oxygen at home and will require admission to the hospital for further workup and management. Considered PE but other etiologies considered more likely at this time and patient will be admitted to the hospital for continuous monitoring.  Leo Grosser, MD 10/30/2014 0355  Pattricia Boss, MD 10/20/14 [phone]

## 2014-10-14 NOTE — Telephone Encounter (Signed)
Noted. Severe pain uncontrolled despite gabapentin 200mg , tylenol, fentanyl patch, and oxycodone this AM. Agree with further evaluation, daughter state she's unable to bring pt into office.  Xray from last week - ATH changes + lymphedema changes.  Also printed fentanyl patch Rx and placed in Kim's box. plz call later today for f/u ER visit and to pick up script.

## 2014-10-14 NOTE — Telephone Encounter (Signed)
[facility] request rx fentanyl patch for 12 mcg and 25 mcg. Pt will be out of patches first of week.pt last seen 09/21/14 and rx printed 09/16/14. Pt's pain last 2-3 weeks has worsened from lt foot up into lt hip and lower back. Pt seen [place] and had xrays and doppler; gabapentin is not helping pain. When pt tries to bear weight on foot feels like going to pass out due to pain. Pt has pain in foot when sitting as well. Oxycodone and tylenol have been taken this AM without relief of pain. Pt has taken 2 caps of gabapentin at 7 AM this morning. What to do?

## 2014-10-14 NOTE — ED Notes (Signed)
Per EMS, pt coming in from home for evaluation of bilateral leg pain. Pts SpO2 was 84% on RA , pt placed on 2L [place] and improved to 100%. Pt has edema to both lower extremities. Pt has hx of CHF.  Pt alert x4. NAD at this time.

## 2014-10-14 NOTE — Telephone Encounter (Signed)
Just received this message at 5pm.  I thought pt was in severe unmanageable pain and so recommended ER evaluation as daughter had said she could not bring pt into office. Looks like this was not done all day. If I had know she was not going to ER I would have had her come into office for evaluation. Morey Hummingbird spoke with patient's daughter later in the day and she states she is going to take pt to ER tonight.

## 2014-10-14 NOTE — Progress Notes (Signed)
Called for report. Nurse states she will call back.

## 2014-10-14 NOTE — Telephone Encounter (Signed)
Spoke with patient's daughter. She said she didn't take patient to the ER, because she had an appt today that she couldn't miss. She also said she was hesitant to take patient back, because she was very dissatisfied with the MD that saw her last. She felt that he had already made up his mind about her before he ever saw her. I advised that if patient was in as much pain as she was describing (inability to walk, unable to get comfortable when sitting, unable to lay flat) then she needed to be evaluated and the ER was the best place to do so. She was very frustrated because no one told her anything about her foot xray and didn't bother to investigate her back or hip when there previously. I apologized about her not being happy, but advised again that it was still the best place for her to be so that she wouldn't have to go all weekend in pain. She verbalized understanding. Rx placed up front for pick up.

## 2014-10-14 NOTE — Telephone Encounter (Signed)
Spoke with Dr Darnell Level and if pain level is extreme would need to go to ED for pain mgt and eval; if pain is manageable pt can be seen in office. [name] said no way pt could come to office and she will take pt back to ED.

## 2014-10-14 NOTE — H&P (Addendum)
[facility] History and Physical  Patient: Kayla [name]  MRN: [id number]  DOB: Jan 22, 1924  DOS: the patient was seen and examined on 10/22/2014 PCP: Viviana Simpler, MD  Chief Complaint: Left leg pain  HPI: Kayla [name] is a 81 y.o. female with Past medical history of hypertension, hypothyroidism, fibromyalgia, morbid obesity, GERD, chronic diastolic dysfunction, chronic lymphedema, chronic pain syndrome. The patient is presenting with complaints of left heel pain. The pain has been ongoing since last 4 weeks and progressively worsening. She was initially seen in the ER as the pain was making difficult for her to ambulate and she was given oxycodone as well as gabapentin on was discharged home. After going home the family has also increased gabapentin dose as instructed. Despite taking this medication she has been having progressively worsening pain with significantly limited mobility and therefore she presents to ER again. There is no fall trauma or injury reported. She has chronic redness involving the lymphedema bilaterally. The pain is described located in the heel area and is only occurring when she is trying to put any pressure or when she hit that he area with something. She does not have any pain when she is in the bed and at rest. The pain feels like pins and needles and radiates to her head and sometimes to her back as well as rapidly to her neck. No fall no trauma no injury reported. She has chronic constipation and takes stool softener. She has increased dose of OxyContin in the last 2 days. She denies any headache or dizziness. She has dyspnea on eczema which is chronic and has been progressively worsening. She denies any cough denies any chest pain chest tightness. No complaints of fever or chills. No nausea no vomiting no choking episode. No recent changes in her medications reported other than above.  The patient is coming from home. And at her baseline independent  for most of her ADL.  Review of Systems: as mentioned in the history of present illness.  A comprehensive review of the other systems is negative.  Past Medical History  Diagnosis Date  . HTN (hypertension)     difficult to control  . Hypothyroidism   . OP (osteoporosis)   . Fibromyalgia   . Edema of both legs     chronic  . Degenerative disk disease     cervical  . Follicular lymphoma     Quiescent. Dr. Fransisco Beau; Dr. Kathyrn Sheriff  . Meningioma     Left frontal  . GERD (gastroesophageal reflux disease)   . Obesity   . Cerebral hemorrhage 2004    hypertensive  . Hx: UTI (urinary tract infection)   . Renal artery aneurysm     with stenosis-right; followed by Dr. Delana Meyer  . Diastolic CHF, chronic     Echo (9-11) with EF 65-70%, mild LVH, mid AI, Mild MR, PA systolic pressure > 15VVOH   Past Surgical History  Procedure Laterality Date  . Cataract extraction  10/05 and 4/07    Left then Right [facility])  . Abdominal hysterectomy  1958  . Cerebral hemorrhage  10/2002  . Cervical fusion  1985  . Rectocele repair  1982  . Cystocele repair  1982   Social History:  reports that she has never smoked. She has never used smokeless tobacco. She reports that she does not drink alcohol or use illicit drugs.  Allergies  Allergen Reactions  . Amoxicillin-Pot Clavulanate     unknown  . Augmentin [Amoxicillin-Pot Clavulanate]   .  Cefuroxime Axetil     unknown  . Cephalexin     Felt uncomfortable  . Levaquin [Levofloxacin In D5w]     unknown  . Sulfonamide Derivatives     unknown    Family History  Problem Relation Age of Onset  . Stroke Father   . Cancer Mother     Myeloma  . Hypertension      family    Prior to Admission medications   Medication Sig Start Date End Date Taking? Authorizing Provider  acetaminophen (TYLENOL) 500 MG tablet Take 500 mg by mouth every morning.   Yes Historical Provider, MD  aliskiren (TEKTURNA) 300 MG tablet TAKE ONE TABLET BY MOUTH ONCE DAILY  09/14/14  Yes Larey Dresser, MD  aspirin 81 MG tablet Take 81 mg by mouth daily.     Yes Historical Provider, MD  carvedilol (COREG) 25 MG tablet Take 25 mg by mouth daily at 6 PM.  03/22/13  Yes Wellington Hampshire, MD  cetirizine (ZYRTEC) 10 MG tablet Take 10 mg by mouth at bedtime.   Yes Historical Provider, MD  Cholecalciferol (VITAMIN D3) 2000 UNITS capsule Take 2,000 Units by mouth daily.   Yes Historical Provider, MD  fentaNYL (DURAGESIC - DOSED MCG/HR) 12 MCG/HR Place 1 patch (12.5 mcg total) onto the skin every 3 (three) days. along with the 70mcg patch 10/27/2014  Yes Ria Bush, MD  fentaNYL (DURAGESIC - DOSED MCG/HR) 25 MCG/HR patch Place 1 patch (25 mcg total) onto the skin every 3 (three) days. 10/30/2014  Yes Ria Bush, MD  gabapentin (NEURONTIN) 100 MG capsule Take 1 capsule (100 mg total) by mouth at bedtime. 10/07/14  Yes Alexa Sherral Hammers, MD  hydrALAZINE (APRESOLINE) 25 MG tablet Take 1 tablet (25 mg total) by mouth 3 (three) times daily. 03/30/14  Yes Wellington Hampshire, MD  lisinopril (PRINIVIL,ZESTRIL) 40 MG tablet TAKE ONE TABLET BY MOUTH ONCE DAILY 05/02/14  Yes Wellington Hampshire, MD  Lutein-Zeaxanthin 6-1 MG TABS Take 1 tablet by mouth daily.     Yes Historical Provider, MD  NONFORMULARY OR COMPOUNDED ITEM T3 3.1 mcg/ T4 12.5 mcg take 1 capsule by mouth daily Patient taking differently: T3 3.1 mcg/ T4 12.5 mcg take 1 capsule by mouth daily at supper 05/10/14  Yes Venia Carbon, MD  nystatin (MYCOSTATIN) 100000 UNIT/ML suspension Take 5 mLs (500,000 Units total) by mouth 4 (four) times daily. Use until resolved and then for 2 more days additionally. 09/30/14  Yes Tonia Ghent, MD  oxyCODONE (OXY IR/ROXICODONE) 5 MG immediate release tablet Take 1 tablet (5 mg total) by mouth 2 (two) times daily as needed for severe pain. 09/23/14  Yes Venia Carbon, MD  phenazopyridine (PYRIDIUM) 95 MG tablet Take 95 mg by mouth 2 (two) times daily.   Yes Historical Provider, MD    potassium chloride SA (KLOR-CON M20) 20 MEQ tablet Take 1 tablet (20 mEq total) by mouth daily. Take extra tab when you take extra Torsemide Patient taking differently: Take 10 mEq by mouth daily. Take extra tab when you take extra Torsemide 09/14/14  Yes Larey Dresser, MD  sennosides-docusate sodium (SENOKOT-S) 8.6-50 MG tablet Take 1 tablet by mouth 2 (two) times daily.    Yes Historical Provider, MD  Simethicone (GAS-X MAXIMUM STRENGTH PO) Take 1 tablet by mouth 2 (two) times daily.    Yes Historical Provider, MD  torsemide (DEMADEX) 20 MG tablet Take 1 tablet (20 mg total) by mouth daily. Take an additional  20 mg on Wednesdays 07/25/14  Yes Jolaine Artist, MD  triamcinolone (NASACORT) 55 MCG/ACT AERO nasal inhaler Place 2 sprays into the nose daily.   Yes Historical Provider, MD    Physical Exam: Filed Vitals:   10/30/2014 2130 10/15/2014 2145 11/03/2014 2200 10/09/2014 2245  BP: 114/56 116/64 106/64 105/50  Pulse: 56 57 54 55  Temp:      TempSrc:      Resp: 23 21 21 17   SpO2: 91% 96% 96% 97%    General: Alert, Awake and Oriented to Time, Place and Person. Appear in mild distress Eyes: PERRL ENT: Oral Mucosa clear moist. Neck: Difficult to assess JVD Cardiovascular: S1 and S2 Present, no Murmur, Peripheral Pulses Present Respiratory: Bilateral Air entry equal and Decreased,  Bilateral basal Crackles, no wheezes Abdomen: Bowel Sound present, Soft and no tender Skin: Bilateral leg redness with warmth which is chronic per patient and family Extremities: Bilateral lymphedema, no calf tenderness Point tenderness at heel on the left foot Neurologic: Grossly no focal neuro deficit.  Labs on Admission:  CBC:  Recent Labs Lab 11/01/2014 1958  WBC 7.1  NEUTROABS 5.4  HGB 10.3*  HCT 33.8*  MCV 93.4  PLT 201    CMP     Component Value Date/Time   NA 139 10/08/2014 1958   NA 140 08/15/2011 1055   K 4.9 10/29/2014 1958   CL 107 10/15/2014 1958   CO2 26 10/20/2014 1958    GLUCOSE 119* 10/10/2014 1958   GLUCOSE 99 08/15/2011 1055   BUN 26* 10/20/2014 1958   BUN 21 08/15/2011 1055   CREATININE 1.34* 10/09/2014 1958   CREATININE 0.78 01/11/2011 1527   CALCIUM 9.8 10/16/2014 1958   CALCIUM 10.2 07/06/2010 2354   PROT 5.8* 10/29/2014 1958   PROT 6.9 05/29/2011 1317   ALBUMIN 3.1* 10/19/2014 1958   AST 16 10/12/2014 1958   ALT 13* 10/28/2014 1958   ALKPHOS 45 10/15/2014 1958   BILITOT 0.5 10/12/2014 1958   GFRNONAA 34* 11/03/2014 1958   GFRAA 39* 10/28/2014 1958    No results for input(s): LIPASE, AMYLASE in the last 168 hours.  No results for input(s): CKTOTAL, CKMB, CKMBINDEX, TROPONINI in the last 168 hours. BNP (last 3 results)  Recent Labs  10/23/2014 1958  BNP 212.6*    ProBNP (last 3 results)  Recent Labs  04/13/14 1556  PROBNP 470.9*     Radiological Exams on Admission: Dg Chest 2 View  10/13/2014   CLINICAL DATA:  Bilateral lower extremity pain and swelling  EXAM: CHEST  2 VIEW  COMPARISON:  04/13/2014  FINDINGS: Mild right perihilar scarring, chronic. Mild patchy left lower lobe opacity, atelectasis versus pneumonia. No frank interstitial edema. No pleural effusion or pneumothorax.  Cardiomegaly.  Degenerative changes of the visualized thoracolumbar spine. Stable moderate compression fracture deformity at T10.  IMPRESSION: Mild patchy left lower lobe opacity, atelectasis versus pneumonia.  No frank interstitial edema.   Electronically Signed   By: Julian Hy M.D.   On: 10/13/2014 20:36   Dg Pelvis 1-2 Views  10/09/2014   CLINICAL DATA:  Bilateral lower extremity pain  EXAM: PELVIS - 1-2 VIEW  COMPARISON:  None.  FINDINGS: No fracture or dislocation is seen.  Bilateral hip joint spaces are mildly narrowed but symmetric.  Visualized bony pelvis appears intact.  IMPRESSION: Negative.   Electronically Signed   By: Julian Hy M.D.   On: 10/17/2014 20:34   Assessment/Plan Principal Problem:   Acute respiratory failure with  hypoxia and hypercarbia Active Problems:   Hypothyroidism   Essential hypertension, benign   Chronic venous insufficiency   Chronic diastolic heart failure   Lymphedema of leg   Pulmonary hypertension   Plantar fasciitis of left foot   Renal insufficiency   1. Acute respiratory failure with hypoxia and hypercarbia The patient is presenting with complaints of increasing shortness of breath which is chronic for her with hypoxia, chronic left heel pain,. Her workup in the ER shows mild respiratory acidosis with hypoxia requiring 2 L of oxygen. Chest x-ray shows mild patchy left lower lobe opacity suggesting possible atelectasis versus pneumonia. I without fever or cough or leukocytosis pneumonia is less likely and it is more likely atelectasis with Her sedentary state. And hypercarbia is more likely associated with obesity hypoventilation syndrome, with increasing use of narcotics. With this she will be monitored in the stepdown unit overnight. We'll use incentive spirometry, when necessary BiPAP. She also has elevated BNP but at present with mild elevation of serum creatinine IV holding off on aggressive IV hydration and recheck BMP again in the morning and monitor ins and outs and daily weight.  2. Possible plantar fasciitis of the left foot. The patient is presenting with complaints of left foot pain which only occurs when she is weightbearing. She has point tenderness at the heel. Earlier an x-ray was negative for any bony spur or any other acute abnormality. Today and except pelvis is also negative for any other acute abnormality. Does it appears more likely or possible plantar fasciitis and we will consult physical therapy in the morning for orthtic arrangement. If her symptoms does not improve she may require Steroid injection at the local site. Holding off on using NSAIDs at present in view of mild elevation of serum creatinine.  3. Mild renal insufficiency, Bradycardia on  telemetry Borderline blood pressure essential hypertension,  Patient has mild elevation of renal insufficiency not meeting criteria for acute renal failure. At present we will continue with torsemide I would hold lisinopril and she may require reduction in the dose of 4 carvedilol. She has taken off her medications today and therefore all medications retime tomorrow. Monitor closely. She may also require reduction in the dose of fentanyl if her serum creatinine worsens.  4. History of oral thrush. Continue nystatin as home dose.  Advance goals of care discussion: DNR/DNI as per my discussion with patient   DVT Prophylaxis: subcutaneous Heparin Nutrition: Cardiac diet  Family Communication: family was present at bedside, opportunity was given to ask question and all questions were answered satisfactorily at the time of interview. Disposition: Admitted as inpatient step-down unit.   Addendum: The patient remains bradycardic in the night and becomes hypotensive in the morning. At the time of my evaluation patient mentions her pain is much better, she has slept much better last night, does not have any acute complaints other than dry mouth. Patient has received one dose of hydralazine overnight. We discontinue all antihypertensives medication, also discontinue torsemide. EKG. Check lactic acid level as well as routine labs. Cardiology consult will be placed. removing 12.5 fentanyl patch until blood pressure improves. Giving her albumin as she would very well have an CHF exacerbation  Maryella Abood 6:40 AM 10/15/2014    Author: Berle Mull, MD [facility] Pager: [phone] 10/27/2014  If 7PM-7AM, please contact night-coverage [email or web address] Password [email or web address]

## 2014-10-15 ENCOUNTER — Inpatient Hospital Stay (HOSPITAL_COMMUNITY): Payer: Medicare Other

## 2014-10-15 DIAGNOSIS — I10 Essential (primary) hypertension: Secondary | ICD-10-CM

## 2014-10-15 DIAGNOSIS — I890 Lymphedema, not elsewhere classified: Secondary | ICD-10-CM

## 2014-10-15 DIAGNOSIS — M722 Plantar fascial fibromatosis: Secondary | ICD-10-CM | POA: Diagnosis present

## 2014-10-15 DIAGNOSIS — J9601 Acute respiratory failure with hypoxia: Secondary | ICD-10-CM

## 2014-10-15 DIAGNOSIS — I5032 Chronic diastolic (congestive) heart failure: Secondary | ICD-10-CM

## 2014-10-15 DIAGNOSIS — N289 Disorder of kidney and ureter, unspecified: Secondary | ICD-10-CM

## 2014-10-15 DIAGNOSIS — R001 Bradycardia, unspecified: Secondary | ICD-10-CM

## 2014-10-15 LAB — CBC WITH DIFFERENTIAL/PLATELET
BASOS ABS: 0 10*3/uL (ref 0.0–0.1)
Basophils Relative: 0 % (ref 0–1)
EOS PCT: 2 % (ref 0–5)
Eosinophils Absolute: 0.2 10*3/uL (ref 0.0–0.7)
HCT: 33.8 % — ABNORMAL LOW (ref 36.0–46.0)
HEMOGLOBIN: 10.1 g/dL — AB (ref 12.0–15.0)
LYMPHS PCT: 11 % — AB (ref 12–46)
Lymphs Abs: 0.8 10*3/uL (ref 0.7–4.0)
MCH: 28.7 pg (ref 26.0–34.0)
MCHC: 29.9 g/dL — ABNORMAL LOW (ref 30.0–36.0)
MCV: 96 fL (ref 78.0–100.0)
Monocytes Absolute: 0.8 10*3/uL (ref 0.1–1.0)
Monocytes Relative: 12 % (ref 3–12)
NEUTROS ABS: 5 10*3/uL (ref 1.7–7.7)
Neutrophils Relative %: 75 % (ref 43–77)
Platelets: 200 10*3/uL (ref 150–400)
RBC: 3.52 MIL/uL — AB (ref 3.87–5.11)
RDW: 15 % (ref 11.5–15.5)
WBC: 6.8 10*3/uL (ref 4.0–10.5)

## 2014-10-15 LAB — TROPONIN I
Troponin I: <0.03 ng/mL (ref <0.031)
Troponin I: <0.03 ng/mL (ref <0.031)

## 2014-10-15 LAB — LACTIC ACID, PLASMA
Lactic Acid, Venous: 0.7 mmol/L (ref 0.5–2.0)
Lactic Acid, Venous: 0.5 mmol/L (ref 0.5–2.0)

## 2014-10-15 LAB — COMPREHENSIVE METABOLIC PANEL
ALBUMIN: 3.1 g/dL — AB (ref 3.5–5.0)
ALT: 14 U/L (ref 14–54)
AST: 15 U/L (ref 15–41)
Alkaline Phosphatase: 41 U/L (ref 38–126)
Anion gap: 5 (ref 5–15)
BUN: 27 mg/dL — ABNORMAL HIGH (ref 6–20)
CHLORIDE: 104 mmol/L (ref 101–111)
CO2: 29 mmol/L (ref 22–32)
CREATININE: 1.46 mg/dL — AB (ref 0.44–1.00)
Calcium: 9.7 mg/dL (ref 8.9–10.3)
GFR calc Af Amer: 36 mL/min — ABNORMAL LOW (ref >60)
GFR, EST NON AFRICAN AMERICAN: 31 mL/min — AB (ref >60)
GLUCOSE: 108 mg/dL — AB (ref 65–99)
Potassium: 5 mmol/L (ref 3.5–5.1)
SODIUM: 138 mmol/L (ref 135–145)
TOTAL PROTEIN: 6.2 g/dL — AB (ref 6.5–8.1)
Total Bilirubin: 0.5 mg/dL (ref 0.3–1.2)

## 2014-10-15 LAB — PROTIME-INR
INR: 1.39 (ref 0.00–1.49)
PROTHROMBIN TIME: 17.2 s — AB (ref 11.6–15.2)

## 2014-10-15 LAB — MRSA PCR SCREENING: MRSA BY PCR: NEGATIVE

## 2014-10-15 LAB — MAGNESIUM: MAGNESIUM: 2.1 mg/dL (ref 1.7–2.4)

## 2014-10-15 LAB — TSH: TSH: 4.026 u[IU]/mL (ref 0.350–4.500)

## 2014-10-15 MED ORDER — SODIUM CHLORIDE 0.9 % IV BOLUS (SEPSIS)
250.0000 mL | Freq: Once | INTRAVENOUS | Status: DC
  Administered 2014-10-15: 250 mL via INTRAVENOUS

## 2014-10-15 MED ORDER — ALBUMIN HUMAN 5 % IV SOLN
12.5000 g | Freq: Once | INTRAVENOUS | Status: AC
  Administered 2014-10-15: 12.5 g via INTRAVENOUS
  Filled 2014-10-15 (×2): qty 250

## 2014-10-15 MED ORDER — SODIUM CHLORIDE 0.9 % IV BOLUS (SEPSIS)
500.0000 mL | Freq: Once | INTRAVENOUS | Status: AC
  Administered 2014-10-15: 500 mL via INTRAVENOUS

## 2014-10-15 MED ORDER — CARVEDILOL 6.25 MG PO TABS
6.2500 mg | ORAL_TABLET | Freq: Every day | ORAL | Status: DC
  Filled 2014-10-15 (×2): qty 1

## 2014-10-15 MED ORDER — NYSTATIN 100000 UNIT/GM EX POWD
Freq: Two times a day (BID) | CUTANEOUS | Status: DC
  Administered 2014-10-15: via TOPICAL
  Administered 2014-10-15 (×2): 1 g via TOPICAL
  Administered 2014-10-16 – 2014-10-18 (×5): via TOPICAL
  Administered 2014-10-18: 1 g via TOPICAL
  Administered 2014-10-19 – 2014-10-23 (×9): via TOPICAL
  Filled 2014-10-15 (×2): qty 15

## 2014-10-15 NOTE — Consult Note (Signed)
Reason for Consult: Hypotension/bradycardia  Requesting Physician: Posey Pronto  HPI: Kayla [name] is an 81 year old mildly overweight Caucasian female who we are asked to see because of relative bradycardia and hypotension. She has been seen by Dr. Haroldine Laws  in the past. She has a history of diastolic heart failure, hypertension, morbid obesity, and lymphedema. She is on hydralazine, carvedilol and lisinopril as an outpatient for hypertension and diastolic dysfunction. Her ejection fraction by recent 2-D echo. She was admitted with chronic left foot pain, cellulitis and question sepsis. She denies chest pain or shortness of breath. Her blood pressures running in the low 100 range and heart rates in the 50s. EKG shows sinus bradycardia.   Problem List: Patient Active Problem List   Diagnosis Date Noted  . Plantar fasciitis of left foot 10/15/2014  . Renal insufficiency 10/15/2014  . Acute respiratory failure with hypoxia and hypercarbia 10/23/2014  . Advance directive discussed with patient 08/18/2014  . Pulmonary hypertension 06/15/2014  . Routine general medical examination at a health care facility 08/12/2013  . Lymphedema of leg 04/20/2013  . Neck pain, chronic 07/26/2010  . HYPERCALCEMIA 07/06/2010  . Chronic diastolic heart failure 27/78/2423  . INCONTINENCE 02/27/2009  . REFLUX ESOPHAGITIS 01/17/2009  . BACK PAIN 04/10/2007  . Hypothyroidism 08/19/2006  . Essential hypertension, benign 08/19/2006  . [place] DISEASE, CERVICAL 08/19/2006  . FIBROMYALGIA 08/19/2006  . OSTEOPOROSIS 08/19/2006  . Chronic venous insufficiency 08/19/2006  . Nodular lymphoma of extranodal and/or solid organ site 11/03/2004    PMHx:  Past Medical History  Diagnosis Date  . HTN (hypertension)     difficult to control  . Hypothyroidism   . OP (osteoporosis)   . Fibromyalgia   . Edema of both legs     chronic  . Degenerative disk disease     cervical  . Follicular lymphoma     Quiescent. Dr. Fransisco Beau; Dr. Kathyrn Sheriff  . Meningioma     Left frontal  . GERD (gastroesophageal reflux disease)   . Obesity   . Cerebral hemorrhage 2004    hypertensive  . Hx: UTI (urinary tract infection)   . Renal artery aneurysm     with stenosis-right; followed by Dr. Delana Meyer  . Diastolic CHF, chronic     Echo (9-11) with EF 65-70%, mild LVH, mid AI, Mild MR, PA systolic pressure > 53IRWE   Past Surgical History  Procedure Laterality Date  . Cataract extraction  10/05 and 4/07    Left then Right [facility])  . Abdominal hysterectomy  1958  . Cerebral hemorrhage  10/2002  . Cervical fusion  1985  . Rectocele repair  1982  . Cystocele repair  1982    FAMHx: Family History  Problem Relation Age of Onset  . Stroke Father   . Cancer Mother     Myeloma  . Hypertension      family    SOCHx:  reports that she has never smoked. She has never used smokeless tobacco. She reports that she does not drink alcohol or use illicit drugs.  ALLERGIES: Allergies  Allergen Reactions  . Amoxicillin-Pot Clavulanate     unknown  . Augmentin [Amoxicillin-Pot Clavulanate]   . Cefuroxime Axetil     unknown  . Cephalexin     Felt uncomfortable  . Levaquin [Levofloxacin In D5w]     unknown  . Sulfonamide Derivatives     unknown    ROS: Pertinent items are noted in HPI.  HOME MEDICATIONS: Prescriptions prior to admission  Medication Sig Dispense Refill Last Dose  . acetaminophen (TYLENOL) 500 MG tablet Take 500 mg by mouth every morning.   10/13/2014 at 700  . aliskiren (TEKTURNA) 300 MG tablet TAKE ONE TABLET BY MOUTH ONCE DAILY 30 tablet 6 10/13/2014 at Unknown time  . aspirin 81 MG tablet Take 81 mg by mouth daily.     2 weeks ago  . carvedilol (COREG) 25 MG tablet Take 25 mg by mouth daily at 6 PM.    10/16/2014 at 1600  . cetirizine (ZYRTEC) 10 MG tablet Take 10 mg by mouth at bedtime.   10/13/2014 at Unknown time  . Cholecalciferol (VITAMIN D3) 2000 UNITS capsule Take 2,000 Units by mouth daily.    10/20/2014 at Unknown time  . fentaNYL (DURAGESIC - DOSED MCG/HR) 12 MCG/HR Place 1 patch (12.5 mcg total) onto the skin every 3 (three) days. along with the 74mcg patch 10 patch 0 11/03/2014 at Unknown time  . fentaNYL (DURAGESIC - DOSED MCG/HR) 25 MCG/HR patch Place 1 patch (25 mcg total) onto the skin every 3 (three) days. 10 patch 0 10/20/2014 at Unknown time  . gabapentin (NEURONTIN) 100 MG capsule Take 1 capsule (100 mg total) by mouth at bedtime. 30 capsule 0 10/13/2014 at Unknown time  . hydrALAZINE (APRESOLINE) 25 MG tablet Take 1 tablet (25 mg total) by mouth 3 (three) times daily. 100 tablet 6 11/03/2014 at Unknown time  . lisinopril (PRINIVIL,ZESTRIL) 40 MG tablet TAKE ONE TABLET BY MOUTH ONCE DAILY 90 tablet 3 10/23/2014 at Unknown time  . Lutein-Zeaxanthin 6-1 MG TABS Take 1 tablet by mouth daily.     10/23/2014 at Unknown time  . NONFORMULARY OR COMPOUNDED ITEM T3 3.1 mcg/ T4 12.5 mcg take 1 capsule by mouth daily (Patient taking differently: T3 3.1 mcg/ T4 12.5 mcg take 1 capsule by mouth daily at supper) 30 each 11 10/13/2014 at Unknown time  . nystatin (MYCOSTATIN) 100000 UNIT/ML suspension Take 5 mLs (500,000 Units total) by mouth 4 (four) times daily. Use until resolved and then for 2 more days additionally. 180 mL 0 10/17/2014 at Unknown time  . oxyCODONE (OXY IR/ROXICODONE) 5 MG immediate release tablet Take 1 tablet (5 mg total) by mouth 2 (two) times daily as needed for severe pain. 60 tablet 0 10/12/2014 at 400  . phenazopyridine (PYRIDIUM) 95 MG tablet Take 95 mg by mouth 2 (two) times daily.   10/25/2014 at 1030  . potassium chloride SA (KLOR-CON M20) 20 MEQ tablet Take 1 tablet (20 mEq total) by mouth daily. Take extra tab when you take extra Torsemide (Patient taking differently: Take 10 mEq by mouth daily. Take extra tab when you take extra Torsemide) 40 tablet 6 10/15/2014 at Unknown time  . sennosides-docusate sodium (SENOKOT-S) 8.6-50 MG tablet Take 1 tablet by mouth 2 (two) times  daily.    10/23/2014 at Unknown time  . Simethicone (GAS-X MAXIMUM STRENGTH PO) Take 1 tablet by mouth 2 (two) times daily.    10/11/2014 at Unknown time  . torsemide (DEMADEX) 20 MG tablet Take 1 tablet (20 mg total) by mouth daily. Take an additional 20 mg on Wednesdays 35 tablet 2 11/02/2014 at Unknown time  . triamcinolone (NASACORT) 55 MCG/ACT AERO nasal inhaler Place 2 sprays into the nose daily.   10/13/2014 at Unknown time    HOSPITAL MEDICATIONS: I have reviewed the patient's current medications.  VITALS: Blood pressure 83/36, pulse 49, temperature 97.7 F (36.5 C), temperature source Oral, resp. rate 16, height 5\' 2"  (1.575  m), weight 290 lb 12.6 oz (131.9 kg), SpO2 98 %.  INPUT/OUTPUT I/O last 3 completed shifts: In: 240 [P.O.:240] Out: -  Total I/O In: 3 [I.V.:3] Out: -     PHYSICAL EXAM: General appearance: alert and no distress Neck: no adenopathy, no carotid bruit, no JVD, supple, symmetrical, trachea midline and thyroid not enlarged, symmetric, no tenderness/mass/nodules Lungs: clear to auscultation bilaterally Heart: regular rate and rhythm, S1, S2 normal, no murmur, click, rub or gallop Extremities: severe lymphedema which is chronic and overlying cellulitis bilaterally  LABS:  BMP  Recent Labs  04/13/14 1605 08/18/14 1447 10/08/2014 1958 10/15/14 0630  NA 144 139 139 138  K 3.9 4.0 4.9 5.0  CL 103 104 107 104  CO2 29 32 26 29  GLUCOSE 97 118* 119* 108*  BUN 27* 20 26* 27*  CREATININE 0.93 0.90 1.34* 1.46*  CALCIUM 10.8* 10.8* 9.8 9.7  GFRNONAA 53*  --  34* 31*  GFRAA 62*  --  39* 36*    CBC  Recent Labs Lab 10/15/14 0630  WBC 6.8  RBC 3.52*  HGB 10.1*  HCT 33.8*  PLT 200  MCV 96.0    HEMOGLOBIN A1C Lab Results  Component Value Date   HGBA1C 6.2 09/19/2008    Cardiac Panel (last 3 results)  Recent Labs  10/15/14 0630  TROPONINI <0.03    BNP (last 3 results)  Recent Labs  04/13/14 1556  PROBNP 470.9*    TSH  Recent  Labs  02/11/14 1314  TSH 2.16    CHOLESTEROL  Recent Labs  02/11/14 1314 08/18/14 1447  CHOL 185 161    Hepatic Function Panel  Recent Labs  08/18/14 1447 10/07/2014 1958 10/15/14 0630  PROT 6.9 5.8* 6.2*  ALBUMIN 3.8 3.1* 3.1*  AST 16 16 15   ALT 16 13* 14  ALKPHOS 43 45 41  BILITOT 0.5 0.5 0.5    IMAGING: Dg Chest 2 View  10/13/2014   CLINICAL DATA:  Bilateral lower extremity pain and swelling  EXAM: CHEST  2 VIEW  COMPARISON:  04/13/2014  FINDINGS: Mild right perihilar scarring, chronic. Mild patchy left lower lobe opacity, atelectasis versus pneumonia. No frank interstitial edema. No pleural effusion or pneumothorax.  Cardiomegaly.  Degenerative changes of the visualized thoracolumbar spine. Stable moderate compression fracture deformity at T10.  IMPRESSION: Mild patchy left lower lobe opacity, atelectasis versus pneumonia.  No frank interstitial edema.   Electronically Signed   By: Julian Hy M.D.   On: 10/19/2014 20:36   Dg Pelvis 1-2 Views  10/20/2014   CLINICAL DATA:  Bilateral lower extremity pain  EXAM: PELVIS - 1-2 VIEW  COMPARISON:  None.  FINDINGS: No fracture or dislocation is seen.  Bilateral hip joint spaces are mildly narrowed but symmetric.  Visualized bony pelvis appears intact.  IMPRESSION: Negative.   Electronically Signed   By: Julian Hy M.D.   On: 10/26/2014 20:34   Dg Chest Port 1 View  10/15/2014   CLINICAL DATA:  Shortness of breath.  EXAM: PORTABLE CHEST - 1 VIEW  COMPARISON:  11/02/2014  FINDINGS: Cardiac silhouette remains mildly enlarged. Thoracic aortic calcification is noted. Lungs are mildly hypoinflated with mild elevation of the right hemidiaphragm. Mild chronic coarsening of the interstitial markings is similar to the prior study. There is persistent patchy retrocardiac left lower lobe opacity which is similar to the prior study. More laterally in the left midlung is new opacity which is largely linear in configuration. No overt  pulmonary edema, sizable  pleural effusion, or pneumothorax is identified. No acute osseous abnormality is seen.  IMPRESSION: Patchy left basilar opacities, mildly increased from prior and favored to represent atelectasis although infection is not excluded.   Electronically Signed   By: Logan Bores   On: 10/15/2014 10:35    IMPRESSION: 1. Diastolic heart failure 2. Hypertension 3. Lymphedema 4.    RECOMMENDATION: 1. Her BNP is low. I'm wondering whether some of her hypotension is related to sepsis. I agree with holding her beta blocker and lisinopril and follow her vital signs. I do not think she requires diuresis. We will continue to follow her.  Time Spent Directly with Patient: 20 minutes  Quay Burow 10/15/2014, 11:02 AM

## 2014-10-15 NOTE — Progress Notes (Addendum)
Report rec'd at 2255.

## 2014-10-15 NOTE — Progress Notes (Addendum)
[facility] PROGRESS NOTE  Kayla [name] NTI:[id number] DOB: [birth year] DOA: 10/18/2014 PCP: Kayla Simpler, MD  Assessment/Plan: Principal Problem:   Acute respiratory failure with hypoxia and hypercarbia Active Problems:   Hypothyroidism   Essential hypertension, benign   Chronic venous insufficiency   Chronic diastolic heart failure   Lymphedema of leg   Pulmonary hypertension   Plantar fasciitis of left foot   Renal insufficiency    Acute on chronic  hypoxic and hypercarbic respiratory failure History of diastolic heart failure , likely obstructive sleep apnea, recent use of long-acting narcotics including fentanyl patch and OxyContin, questionable pneumonia Patient starts to deteriorate , place the patient on BiPAP Recent venous Doppler was negative Repeat chest x-ray today to rule out pneumonia [does not report any fever or cough]  Possible plantar fasciitis Lymphedema Avoid NSAIDs and long-acting narcotics, fentanyl patch has been discontinued Use only tylenol  and Ultram as needed PT eval  Hypertension, patient on Tekturna, Coreg, hydralazine, lisinopril, Demadex Hold above due to hypotension and bradycardia Suspect that the patient's clearance is decreased because of acute on chronic renal failure Cardiology consulted Rule out infection as the underlying cause  Acute kidney injury  Baseline creatinine 0.9 Patient was given 500 mL normal saline bolus this morning  Chronic diastolic heart failure with an EF of 65-70% Hold diuretic for now Hold  other antihypertensives medications because of low blood pressure and bradycardia  Morbid obesity    Code Status: full Family Communication: family updated about patient's clinical progress Disposition Plan:  Continue stepdown    Brief narrative: 81 y.o. female with Past medical history of hypertension, hypothyroidism, fibromyalgia, morbid obesity, GERD, chronic diastolic dysfunction, chronic lymphedema,  chronic pain syndrome. The patient is presenting with complaints of left heel pain. The pain has been ongoing since last 4 weeks and progressively worsening. She was initially seen in the ER as the pain was making difficult for her to ambulate and she was given oxycodone as well as gabapentin on was discharged home. After going home the family has also increased gabapentin dose as instructed. Despite taking this medication she has been having progressively worsening pain with significantly limited mobility and therefore she presents to ER again. There is no fall trauma or injury reported. She has chronic redness involving the lymphedema bilaterally. The pain is described located in the heel area and is only occurring when she is trying to put any pressure or when she hit that he area with something. She does not have any pain when she is in the bed and at rest. The pain feels like pins and needles and radiates to her head and sometimes to her back as well as rapidly to her neck. No fall no trauma no injury reported. She has chronic constipation and takes stool softener. She has increased dose of OxyContin in the last 2 days. She denies any headache or dizziness. She has dyspnea on eczema which is chronic and has been progressively worsening. She denies any cough denies any chest pain chest tightness. No complaints of fever or chills. No nausea no vomiting no choking episode. No recent changes in her medications reported other than above.  Consultants:  Cardiology  Procedures:  None  Antibiotics: Anti-infectives    None         HPI/Subjective: Patient complains of pain with ambulation, denies any shortness of breath or chest pain , bradycardic and hypotensive this morning  Objective: Filed Vitals:   10/15/14 0336 10/15/14  0500 10/15/14 0509 10/15/14 0841  BP: 86/38  89/37 83/36  Pulse: 49   49  Temp: 98 F (36.7 C)   97.7 F (36.5 C)  TempSrc: Axillary   Oral  Resp:  17   16  Height:      Weight:  131.9 kg (290 lb 12.6 oz)    SpO2: 98%   98%    Intake/Output Summary (Last 24 hours) at 10/15/14 1003 Last data filed at 10/15/14 7681  Gross per 24 hour  Intake    243 ml  Output      0 ml  Net    243 ml    Exam:  General: No acute respiratory distress Lungs: Clear to auscultation bilaterally without wheezes or crackles Cardiovascular: Regular rate and rhythm without murmur gallop or rub normal S1 and S2 Abdomen: Nontender, nondistended, soft, bowel sounds positive, no rebound, no ascites, no appreciable mass Extremities: Bilateral lymphedema, no calf tenderness Point tenderness at heel on the left foot     Data Review   Micro Results Recent Results (from the past 240 hour(s))  MRSA PCR Screening     Status: None   Collection Time: 10/15/14  3:00 AM  Result Value Ref Range Status   MRSA by PCR NEGATIVE NEGATIVE Final    Comment:        The GeneXpert MRSA Assay (FDA approved for NASAL specimens only), is one component of a comprehensive MRSA colonization surveillance program. It is not intended to diagnose MRSA infection nor to guide or monitor treatment for MRSA infections.     Radiology Reports Dg Chest 2 View  10/21/2014   CLINICAL DATA:  Bilateral lower extremity pain and swelling  EXAM: CHEST  2 VIEW  COMPARISON:  04/13/2014  FINDINGS: Mild right perihilar scarring, chronic. Mild patchy left lower lobe opacity, atelectasis versus pneumonia. No frank interstitial edema. No pleural effusion or pneumothorax.  Cardiomegaly.  Degenerative changes of the visualized thoracolumbar spine. Stable moderate compression fracture deformity at T10.  IMPRESSION: Mild patchy left lower lobe opacity, atelectasis versus pneumonia.  No frank interstitial edema.   Electronically Signed   By: Julian Hy M.D.   On: 10/17/2014 20:36   Dg Pelvis 1-2 Views  10/05/2014   CLINICAL DATA:  Bilateral lower extremity pain  EXAM: PELVIS - 1-2 VIEW   COMPARISON:  None.  FINDINGS: No fracture or dislocation is seen.  Bilateral hip joint spaces are mildly narrowed but symmetric.  Visualized bony pelvis appears intact.  IMPRESSION: Negative.   Electronically Signed   By: Julian Hy M.D.   On: 10/27/2014 20:34   Dg Foot Complete Left  10/07/2014   CLINICAL DATA:  Left foot pain.  Lymph edema of the leg.  EXAM: LEFT FOOT - COMPLETE 3+ VIEW  COMPARISON:  None.  FINDINGS: Moderate osteopenia is present. There is fusion across the first, second, third, and fourth TMT joints. Inter [place] she will joints are fused in the third and fourth digits. No acute fracture is present. Extensive soft tissue swelling is likely related to lymphedema. Vascular calcifications are noted as well.  IMPRESSION: 1. Extensive soft tissue swelling is likely related to lymphedema. No discrete mass is present. 2. Atherosclerotic changes typical of diabetes. 3. Marked osteopenia without acute abnormality of the bones.   Electronically Signed   By: San Morelle M.D.   On: 10/07/2014 13:07     CBC  Recent Labs Lab 10/12/2014 1958 10/15/14 0630  WBC 7.1 6.8  HGB 10.3* 10.1*  HCT 33.8* 33.8*  PLT 201 200  MCV 93.4 96.0  MCH 28.5 28.7  MCHC 30.5 29.9*  RDW 14.7 15.0  LYMPHSABS 0.7 0.8  MONOABS 0.9 0.8  EOSABS 0.2 0.2  BASOSABS 0.0 0.0    Chemistries   Recent Labs Lab 10/16/2014 1958 10/15/14 0630  NA 139 138  K 4.9 5.0  CL 107 104  CO2 26 29  GLUCOSE 119* 108*  BUN 26* 27*  CREATININE 1.34* 1.46*  CALCIUM 9.8 9.7  MG  --  2.1  AST 16 15  ALT 13* 14  ALKPHOS 45 41  BILITOT 0.5 0.5   ------------------------------------------------------------------------------------------------------------------ estimated creatinine clearance is 34.1 mL/min (by C-G formula based on Cr of 1.46). ------------------------------------------------------------------------------------------------------------------ No results for input(s): HGBA1C in the last 72  hours. ------------------------------------------------------------------------------------------------------------------ No results for input(s): CHOL, HDL, LDLCALC, TRIG, CHOLHDL, LDLDIRECT in the last 72 hours. ------------------------------------------------------------------------------------------------------------------ No results for input(s): TSH, T4TOTAL, T3FREE, THYROIDAB in the last 72 hours.  Invalid input(s): FREET3 ------------------------------------------------------------------------------------------------------------------ No results for input(s): VITAMINB12, FOLATE, FERRITIN, TIBC, IRON, RETICCTPCT in the last 72 hours.  Coagulation profile  Recent Labs Lab 10/15/14 0630  INR 1.39    No results for input(s): DDIMER in the last 72 hours.  Cardiac Enzymes  Recent Labs Lab 10/15/14 0630  TROPONINI <0.03   ------------------------------------------------------------------------------------------------------------------ Invalid input(s): POCBNP   CBG: No results for input(s): GLUCAP in the last 168 hours.     Studies: Dg Chest 2 View  10/07/2014   CLINICAL DATA:  Bilateral lower extremity pain and swelling  EXAM: CHEST  2 VIEW  COMPARISON:  04/13/2014  FINDINGS: Mild right perihilar scarring, chronic. Mild patchy left lower lobe opacity, atelectasis versus pneumonia. No frank interstitial edema. No pleural effusion or pneumothorax.  Cardiomegaly.  Degenerative changes of the visualized thoracolumbar spine. Stable moderate compression fracture deformity at T10.  IMPRESSION: Mild patchy left lower lobe opacity, atelectasis versus pneumonia.  No frank interstitial edema.   Electronically Signed   By: Julian Hy M.D.   On: 10/19/2014 20:36   Dg Pelvis 1-2 Views  11/03/2014   CLINICAL DATA:  Bilateral lower extremity pain  EXAM: PELVIS - 1-2 VIEW  COMPARISON:  None.  FINDINGS: No fracture or dislocation is seen.  Bilateral hip joint spaces are mildly  narrowed but symmetric.  Visualized bony pelvis appears intact.  IMPRESSION: Negative.   Electronically Signed   By: Julian Hy M.D.   On: 10/16/2014 20:34      Lab Results  Component Value Date   HGBA1C 6.2 09/19/2008   HGBA1C 6.2* 05/31/2008   HGBA1C 6.3* 11/20/2007   Lab Results  Component Value Date   MICROALBUR 0.5 08/11/2006   LDLCALC 96 08/18/2014   CREATININE 1.46* 10/15/2014       Scheduled Meds: . albumin human  12.5 g Intravenous Once  . aspirin EC  81 mg Oral Daily  . heparin  5,000 Units Subcutaneous 3 times per day  . nystatin  5 mL Oral TID AC & HS  . nystatin   Topical BID  . senna-docusate  2 tablet Oral QHS  . sodium chloride  3 mL Intravenous Q12H  . triamcinolone  2 spray Nasal Daily   Continuous Infusions:   Principal Problem:   Acute respiratory failure with hypoxia and hypercarbia Active Problems:   Hypothyroidism   Essential hypertension, benign   Chronic venous insufficiency   Chronic diastolic heart failure   Lymphedema of leg   Pulmonary hypertension   Plantar fasciitis of left foot  Renal insufficiency    Time spent: 45 minutes   [place] Pager [phone]. If 7PM-7AM, please contact night-coverage at [email or web address], password [facility] 10/15/2014, 10:03 AM  LOS: 1 day

## 2014-10-15 NOTE — Evaluation (Signed)
Physical Therapy Evaluation Patient Details Name: Kayla [name] MRN: [id number] DOB: 1923-09-09 Today's Date: 10/15/2014   History of Present Illness  Patient is an 81 yo female admitted 10/12/2014 with pain Lt foot/LE impacting mobility, hypoxia, resp failure.    PMH:  hypertension, hypothyroidism, fibromyalgia, morbid obesity, GERD, chronic diastolic dysfunction, chronic lymphedema, chronic pain syndrome  Clinical Impression  Patient presents with problems listed below.  Will benefit from acute PT to maximize functional mobility prior to discharge.  Patient was able to ambulate 20-30' with rollator with supervision only pta.  Patient now requiring +2 assist for transfers.  Feel patient would benefit from ST-SNF for continued therapy at discharge.    Follow Up Recommendations SNF;Supervision/Assistance - 24 hour    Equipment Recommendations  None recommended by PT    Recommendations for Other Services       Precautions / Restrictions Precautions Precautions: Fall Precaution Comments: Pain Lt foot with weight bearing. Restrictions Weight Bearing Restrictions: No      Mobility  Bed Mobility Overal bed mobility: Needs Assistance;+ 2 for safety/equipment Bed Mobility: Supine to Sit     Supine to sit: Mod assist;+2 for safety/equipment     General bed mobility comments: Assist to move LLE off of bed.  Assist to raise trunk.  Patient using rails.  Once upright, patient with fair sitting balance.  Transfers Overall transfer level: Needs assistance Equipment used: Rolling walker (2 wheeled) Transfers: Sit to/from [facility] Sit to Stand: Mod assist;+2 physical assistance Stand pivot transfers: Min assist;+2 physical assistance       General transfer comment: Verbal cues for hand placement and technique.  Assist to rise to standing and for balance.  Patient able to take several steps to pivot to chair.  At end of pivot, patient's Lt knee buckled and patient sat  abruptly into chair.  Elevated feet.  Patient reported pain increased from 0 to 4 with weight bearing.  Ambulation/Gait                Stairs            Wheelchair Mobility    Modified Rankin (Stroke Patients Only)       Balance Overall balance assessment: Needs assistance Sitting-balance support: No upper extremity supported;Feet supported Sitting balance-Leahy Scale: Fair     Standing balance support: Single extremity supported Standing balance-Leahy Scale: Poor                               Pertinent Vitals/Pain Pain Assessment: 0-10 Pain Score: 4  Pain Location: Lt heel/foot Pain Descriptors / Indicators: Tender;Sore;Shooting Pain Intervention(s): Limited activity within patient's tolerance;Repositioned    Home Living Family/patient expects to be discharged to:: Skilled nursing facility Living Arrangements: Spouse/significant other;Children (Daughter)             Home Equipment: Environmental consultant - 4 wheels;Cane - single point;Shower seat (Lift chair; tall toilet)      Prior Function Level of Independence: Independent with assistive device(s);Needs assistance   Gait / Transfers Assistance Needed: Patient uses rollator for ambulation short distances in house (20-30').  Patient uses lift chair during day and sleeps in it at night.  ADL's / Homemaking Assistance Needed: Aide assists patient with shower 2x/week.  Daughter prepares meals and does housekeeping.        Hand Dominance        Extremity/Trunk Assessment   Upper Extremity Assessment: Generalized weakness  Lower Extremity Assessment: Generalized weakness (Cellulitis BLE's;  pain Lt plantar surface)         Communication   Communication: No difficulties  Cognition Arousal/Alertness: Awake/alert Behavior During Therapy: WFL for tasks assessed/performed Overall Cognitive Status: Within Functional Limits for tasks assessed                      General  Comments General comments (skin integrity, edema, etc.): Severe edema/lymphedema BLE's including reddness.    Exercises        Assessment/Plan    PT Assessment Patient needs continued PT services  PT Diagnosis Difficulty walking;Generalized weakness;Acute pain   PT Problem List Decreased strength;Decreased activity tolerance;Decreased balance;Decreased mobility;Decreased knowledge of use of DME;Cardiopulmonary status limiting activity;Obesity;Decreased skin integrity;Pain  PT Treatment Interventions DME instruction;Gait training;Functional mobility training;Therapeutic activities;Patient/family education   PT Goals (Current goals can be found in the Care Plan section) Acute Rehab PT Goals Patient Stated Goal: To walk PT Goal Formulation: With patient/family Time For Goal Achievement: 10/29/14 Potential to Achieve Goals: Fair    Frequency Min 3X/week   Barriers to discharge Decreased caregiver support Do not feel daughter is able to provide level of care patient needs with current decrease in mobility.    Co-evaluation               End of Session Equipment Utilized During Treatment: Oxygen Activity Tolerance: Patient limited by pain;Patient limited by fatigue Patient left: in chair;with call bell/phone within reach;with family/visitor present Nurse Communication: Mobility status         Time: 0712-1975 PT Time Calculation (min) (ACUTE ONLY): 42 min   Charges:   PT Evaluation $Initial PT Evaluation Tier I: 1 Procedure PT Treatments $Therapeutic Activity: 23-37 mins   PT G Codes:        Despina Pole 10/26/14, 7:04 PM Carita Pian. Sanjuana Kava, [place] Pager [phone]

## 2014-10-16 DIAGNOSIS — R001 Bradycardia, unspecified: Secondary | ICD-10-CM | POA: Insufficient documentation

## 2014-10-16 LAB — CBC
HCT: 38.1 % (ref 36.0–46.0)
Hemoglobin: 11.2 g/dL — ABNORMAL LOW (ref 12.0–15.0)
MCH: 28.5 pg (ref 26.0–34.0)
MCHC: 29.4 g/dL — AB (ref 30.0–36.0)
MCV: 96.9 fL (ref 78.0–100.0)
Platelets: 248 10*3/uL (ref 150–400)
RBC: 3.93 MIL/uL (ref 3.87–5.11)
RDW: 14.8 % (ref 11.5–15.5)
WBC: 8.1 10*3/uL (ref 4.0–10.5)

## 2014-10-16 LAB — COMPREHENSIVE METABOLIC PANEL
ALT: 15 U/L (ref 14–54)
AST: <5 U/L — ABNORMAL LOW (ref 15–41)
Albumin: 3.3 g/dL — ABNORMAL LOW (ref 3.5–5.0)
Alkaline Phosphatase: 47 U/L (ref 38–126)
Anion gap: 6 (ref 5–15)
BUN: 30 mg/dL — ABNORMAL HIGH (ref 6–20)
CALCIUM: 9.6 mg/dL (ref 8.9–10.3)
CHLORIDE: 104 mmol/L (ref 101–111)
CO2: 26 mmol/L (ref 22–32)
Creatinine, Ser: 1.32 mg/dL — ABNORMAL HIGH (ref 0.44–1.00)
GFR calc Af Amer: 40 mL/min — ABNORMAL LOW (ref >60)
GFR, EST NON AFRICAN AMERICAN: 35 mL/min — AB (ref >60)
Glucose, Bld: 100 mg/dL — ABNORMAL HIGH (ref 65–99)
Potassium: 5.5 mmol/L — ABNORMAL HIGH (ref 3.5–5.1)
Sodium: 136 mmol/L (ref 135–145)
Total Bilirubin: 0.5 mg/dL (ref 0.3–1.2)
Total Protein: 6.3 g/dL — ABNORMAL LOW (ref 6.5–8.1)

## 2014-10-16 LAB — URINALYSIS, ROUTINE W REFLEX MICROSCOPIC
BILIRUBIN URINE: NEGATIVE
GLUCOSE, UA: NEGATIVE mg/dL
Ketones, ur: NEGATIVE mg/dL
Nitrite: POSITIVE — AB
Protein, ur: 100 mg/dL — AB
Specific Gravity, Urine: 1.014 (ref 1.005–1.030)
Urobilinogen, UA: 1 mg/dL (ref 0.0–1.0)
pH: 5 (ref 5.0–8.0)

## 2014-10-16 LAB — URINE MICROSCOPIC-ADD ON

## 2014-10-16 LAB — POTASSIUM: Potassium: 5 mmol/L (ref 3.5–5.1)

## 2014-10-16 MED ORDER — LEVALBUTEROL HCL 1.25 MG/0.5ML IN NEBU
1.2500 mg | INHALATION_SOLUTION | Freq: Three times a day (TID) | RESPIRATORY_TRACT | Status: DC
  Administered 2014-10-16 – 2014-10-20 (×10): 1.25 mg via RESPIRATORY_TRACT
  Filled 2014-10-16 (×13): qty 0.5

## 2014-10-16 MED ORDER — LORAZEPAM 0.5 MG PO TABS
0.5000 mg | ORAL_TABLET | Freq: Two times a day (BID) | ORAL | Status: DC | PRN
  Administered 2014-10-16: 0.5 mg via ORAL
  Filled 2014-10-16: qty 1

## 2014-10-16 MED ORDER — PIPERACILLIN-TAZOBACTAM 3.375 G IVPB
3.3750 g | Freq: Three times a day (TID) | INTRAVENOUS | Status: DC
  Administered 2014-10-16 – 2014-10-23 (×22): 3.375 g via INTRAVENOUS
  Filled 2014-10-16 (×27): qty 50

## 2014-10-16 MED ORDER — MENTHOL 3 MG MT LOZG
1.0000 | LOZENGE | OROMUCOSAL | Status: DC | PRN
  Administered 2014-10-16: 3 mg via ORAL
  Filled 2014-10-16 (×2): qty 9

## 2014-10-16 MED ORDER — OXYCODONE HCL 5 MG PO TABS
5.0000 mg | ORAL_TABLET | Freq: Four times a day (QID) | ORAL | Status: DC | PRN
  Administered 2014-10-16 – 2014-10-19 (×4): 5 mg via ORAL
  Filled 2014-10-16 (×4): qty 1

## 2014-10-16 MED ORDER — SODIUM CHLORIDE 0.9 % IV BOLUS (SEPSIS)
500.0000 mL | Freq: Once | INTRAVENOUS | Status: AC
  Administered 2014-10-16: 500 mL via INTRAVENOUS

## 2014-10-16 MED ORDER — ENOXAPARIN SODIUM 80 MG/0.8ML ~~LOC~~ SOLN
65.0000 mg | SUBCUTANEOUS | Status: DC
  Administered 2014-10-16 – 2014-10-23 (×7): 65 mg via SUBCUTANEOUS
  Filled 2014-10-16 (×9): qty 0.8

## 2014-10-16 MED ORDER — CARVEDILOL 12.5 MG PO TABS
12.5000 mg | ORAL_TABLET | Freq: Two times a day (BID) | ORAL | Status: DC
  Administered 2014-10-16 – 2014-10-19 (×3): 12.5 mg via ORAL
  Filled 2014-10-16 (×8): qty 1

## 2014-10-16 MED ORDER — LEVALBUTEROL HCL 1.25 MG/0.5ML IN NEBU
1.2500 mg | INHALATION_SOLUTION | Freq: Three times a day (TID) | RESPIRATORY_TRACT | Status: DC
  Filled 2014-10-16 (×3): qty 0.5

## 2014-10-16 NOTE — Progress Notes (Signed)
Patient refused heparin INJ stating, " I will not take the heparin until I speak to the doctor."  Patient also c/o panic attacks which she reports is relieved by the PRN oxycodone. [facility] on call paged and updated and reports that the MD will speak with patient in the morning.

## 2014-10-16 NOTE — Consult Note (Signed)
ANTICOAGULATION CONSULT NOTE - Initial Consult  Pharmacy Consult for Lovenox Indication: VTE prophylaxis  Allergies  Allergen Reactions  . Amoxicillin-Pot Clavulanate     unknown  . Augmentin [Amoxicillin-Pot Clavulanate]   . Cefuroxime Axetil     unknown  . Cephalexin     Felt uncomfortable  . Levaquin [Levofloxacin In D5w]     unknown  . Sulfonamide Derivatives     unknown    Patient Measurements: Height: 5\' 2"  (157.5 cm) Weight: 290 lb 9.1 oz (131.8 kg) IBW/kg (Calculated) : 50.1  Vital Signs: Temp: 98.4 F (36.9 C) (06/12 0729) Temp Source: Oral (06/12 0729) BP: 104/52 mmHg (06/12 0729) Pulse Rate: 92 (06/12 0729)  Labs:  Recent Labs  10/25/2014 1958 10/15/14 0630 10/15/14 1125 10/16/14 0246  HGB 10.3* 10.1*  --  11.2*  HCT 33.8* 33.8*  --  38.1  PLT 201 200  --  248  LABPROT  --  17.2*  --   --   INR  --  1.39  --   --   CREATININE 1.34* 1.46*  --  1.32*  TROPONINI  --  <0.03 <0.03  --     Estimated Creatinine Clearance: 37.8 mL/min (by C-G formula based on Cr of 1.32).   Medical History: Past Medical History  Diagnosis Date  . HTN (hypertension)     difficult to control  . Hypothyroidism   . OP (osteoporosis)   . Fibromyalgia   . Edema of both legs     chronic  . Degenerative disk disease     cervical  . Follicular lymphoma     Quiescent. Dr. Fransisco Beau; Dr. Kathyrn Sheriff  . Meningioma     Left frontal  . GERD (gastroesophageal reflux disease)   . Obesity   . Cerebral hemorrhage 2004    hypertensive  . Hx: UTI (urinary tract infection)   . Renal artery aneurysm     with stenosis-right; followed by Dr. Delana Meyer  . Diastolic CHF, chronic     Echo (9-11) with EF 65-70%, mild LVH, mid AI, Mild MR, PA systolic pressure > 52DPOE    Medications:  Scheduled:  . aspirin EC  81 mg Oral Daily  . nystatin  5 mL Oral TID AC & HS  . nystatin   Topical BID  . piperacillin-tazobactam (ZOSYN)  IV  3.375 g Intravenous 3 times per day  . senna-docusate  2  tablet Oral QHS  . sodium chloride  3 mL Intravenous Q12H  . triamcinolone  2 spray Nasal Daily    Assessment: 89yoF here w/ L leg pain. Pharmacy consulted to dose lovenox for DVT prophylaxis. SCr 1.32, CrCl~35-23mL/min, patient wt 131.8kg. Due to patient having BMI >30 (53), will use weight based dosing. Hgb 11.2, plts 248. No bleeding noted.  Goal of Therapy:  Anti-Xa level 0.6-1 units/ml 4hrs after LMWH dose given Monitor platelets by anticoagulation protocol: Yes   Plan:  Lovenox 65mg  SQ q24hr Monitor s/sx of bleeding No dose adjustment anticipated, pharmacy will sign off lovenox consult and continue managing antibiotics Please reconsult if changes are necessary in VTE prophylaxis  Thank you for allowing pharmacy to be part of this patient's care team  Jeremih Dearmas M. Marygrace Sandoval, Pharm.D Clinical Pharmacy Resident Pager: [phone] 10/16/2014 .10:35 AM

## 2014-10-16 NOTE — Procedures (Signed)
Pt is resting well on 3L [place].  RT will not placed PT on BIPAP.  RT will continue to monitor pt.

## 2014-10-16 NOTE — Consult Note (Signed)
ANTIBIOTIC CONSULT NOTE - INITIAL  Pharmacy Consult for Zosyn Indication: rule out pneumonia  Allergies  Allergen Reactions  . Amoxicillin-Pot Clavulanate     unknown  . Augmentin [Amoxicillin-Pot Clavulanate]   . Cefuroxime Axetil     unknown  . Cephalexin     Felt uncomfortable  . Levaquin [Levofloxacin In D5w]     unknown  . Sulfonamide Derivatives     unknown    Patient Measurements: Height: 5\' 2"  (157.5 cm) Weight: 290 lb 9.1 oz (131.8 kg) IBW/kg (Calculated) : 50.1  Vital Signs: Temp: 98.4 F (36.9 C) (06/12 0729) Temp Source: Oral (06/12 0729) BP: 104/52 mmHg (06/12 0729) Pulse Rate: 92 (06/12 0729) Intake/Output from previous day: 06/11 0701 - 06/12 0700 In: 850 [P.O.:600; I.V.:3] Out: -  Intake/Output from this shift:    Labs:  Recent Labs  10/16/2014 1958 10/15/14 0630 10/16/14 0246  WBC 7.1 6.8 8.1  HGB 10.3* 10.1* 11.2*  PLT 201 200 248  CREATININE 1.34* 1.46* 1.32*   Estimated Creatinine Clearance: 37.8 mL/min (by C-G formula based on Cr of 1.32). No results for input(s): VANCOTROUGH, VANCOPEAK, VANCORANDOM, GENTTROUGH, GENTPEAK, GENTRANDOM, TOBRATROUGH, TOBRAPEAK, TOBRARND, AMIKACINPEAK, AMIKACINTROU, AMIKACIN in the last 72 hours.   Microbiology: Recent Results (from the past 720 hour(s))  MRSA PCR Screening     Status: None   Collection Time: 10/15/14  3:00 AM  Result Value Ref Range Status   MRSA by PCR NEGATIVE NEGATIVE Final    Comment:        The GeneXpert MRSA Assay (FDA approved for NASAL specimens only), is one component of a comprehensive MRSA colonization surveillance program. It is not intended to diagnose MRSA infection nor to guide or monitor treatment for MRSA infections.     Medical History: Past Medical History  Diagnosis Date  . HTN (hypertension)     difficult to control  . Hypothyroidism   . OP (osteoporosis)   . Fibromyalgia   . Edema of both legs     chronic  . Degenerative disk disease     cervical   . Follicular lymphoma     Quiescent. Dr. Fransisco Beau; Dr. Kathyrn Sheriff  . Meningioma     Left frontal  . GERD (gastroesophageal reflux disease)   . Obesity   . Cerebral hemorrhage 2004    hypertensive  . Hx: UTI (urinary tract infection)   . Renal artery aneurysm     with stenosis-right; followed by Dr. Delana Meyer  . Diastolic CHF, chronic     Echo (9-11) with EF 65-70%, mild LVH, mid AI, Mild MR, PA systolic pressure > 27XAJO    Medications:  Scheduled:  . aspirin EC  81 mg Oral Daily  . heparin  5,000 Units Subcutaneous 3 times per day  . nystatin  5 mL Oral TID AC & HS  . nystatin   Topical BID  . senna-docusate  2 tablet Oral QHS  . sodium chloride  500 mL Intravenous Once  . sodium chloride  3 mL Intravenous Q12H  . triamcinolone  2 spray Nasal Daily   Assessment: 89yoF admitted 6/10 with L leg pain. CXR revealing PNA vs atelectasis; pharmacy consulted to begin Zosyn for PNA. WBC 8.1, afebrile. SCr 1.32, CRCl~35-40. Patient w/ several unknown beta-lactam allergies. Change of cross-reactivity low.   Goal of Therapy:  Resolution of infection  Plan:  Zosyn 3.375g IV q8h extended infusion Monitor renal function, cultures, and clinical progress F/u de-escalation  Thank you for allowing pharmacy to be part of  this patient's care team  Lorena, Pharm.D Clinical Pharmacy Resident Pager: [phone] 10/16/2014 .8:00 AM

## 2014-10-16 NOTE — Progress Notes (Signed)
Did not place patient on CPAP per RN. CPAP at bedside. RT will continue to monitor.

## 2014-10-16 NOTE — Progress Notes (Signed)
10/05/2014 Secretary called patient needed assistant. RN arrive in room patient saturation was 65 to 68% and patient lips were purple and said she could not breath. Patient oxygen was increase to 4.5 liters and eventually she when to 90%. Patient continue saying she felt something was stuck in her throat and continue to cough up mucus. Patient oxygen increase to 95 to 98%. Dr Allyson Sabal was notified and was made aware. [facility] RN.

## 2014-10-16 NOTE — Progress Notes (Signed)
Subjective:  SOB when flat  Objective:  Vital Signs in the last 24 hours: Temp:  [97.6 F (36.4 C)-98.4 F (36.9 C)] 98.4 F (36.9 C) (06/12 0729) Pulse Rate:  [58-92] 92 (06/12 0729) Resp:  [13-71] 21 (06/12 0729) BP: (78-116)/(46-66) 104/52 mmHg (06/12 0729) SpO2:  [64 %-99 %] 92 % (06/12 0729) Weight:  [290 lb 9.1 oz (131.8 kg)] 290 lb 9.1 oz (131.8 kg) (06/12 0545)  Intake/Output from previous day:  Intake/Output Summary (Last 24 hours) at 10/16/14 1046 Last data filed at 10/16/14 0500  Gross per 24 hour  Intake    850 ml  Output      0 ml  Net    850 ml    Physical Exam: General appearance: alert, cooperative, no distress and morbidly obese Lungs: decreased breath sounds Heart: regular rate and rhythm Extremities: bilateral errythema, chronic skin changes and edema   Rate: 66-80  Rhythm: normal sinus rhythm, premature atrial contractions (PAC) and premature ventricular contractions (PVC)  Lab Results:  Recent Labs  10/15/14 0630 10/16/14 0246  WBC 6.8 8.1  HGB 10.1* 11.2*  PLT 200 248    Recent Labs  10/15/14 0630 10/16/14 0246  NA 138 136  K 5.0 5.5*  CL 104 104  CO2 29 26  GLUCOSE 108* 100*  BUN 27* 30*  CREATININE 1.46* 1.32*    Recent Labs  10/15/14 0630 10/15/14 1125  TROPONINI <0.03 <0.03    Recent Labs  10/15/14 0630  INR 1.39    Scheduled Meds: . aspirin EC  81 mg Oral Daily  . enoxaparin (LOVENOX) injection  65 mg Subcutaneous Q24H  . nystatin  5 mL Oral TID AC & HS  . nystatin   Topical BID  . piperacillin-tazobactam (ZOSYN)  IV  3.375 g Intravenous 3 times per day  . senna-docusate  2 tablet Oral QHS  . sodium chloride  3 mL Intravenous Q12H  . triamcinolone  2 spray Nasal Daily   Continuous Infusions:  PRN Meds:.sodium chloride, acetaminophen, LORazepam, ondansetron (ZOFRAN) IV, oxyCODONE, sodium chloride   Imaging:  Dg Chest Port 1 View  10/15/2014   CLINICAL DATA:  Shortness of breath.  EXAM: PORTABLE  CHEST - 1 VIEW  COMPARISON:  10/15/2014  FINDINGS: Cardiac silhouette remains mildly enlarged. Thoracic aortic calcification is noted. Lungs are mildly hypoinflated with mild elevation of the right hemidiaphragm. Mild chronic coarsening of the interstitial markings is similar to the prior study. There is persistent patchy retrocardiac left lower lobe opacity which is similar to the prior study. More laterally in the left midlung is new opacity which is largely linear in configuration. No overt pulmonary edema, sizable pleural effusion, or pneumothorax is identified. No acute osseous abnormality is seen.  IMPRESSION: Patchy left basilar opacities, mildly increased from prior and favored to represent atelectasis although infection is not excluded.   Electronically Signed   By: Logan Bores   On: 10/15/2014 10:35    Cardiac Studies: Echo Feb 8th 2016 Study Conclusions  - Left ventricle: The cavity size was normal. Wall thickness was increased in a pattern of mild LVH. Systolic function was normal. The estimated ejection fraction was in the range of 55% to 60%. Wall motion was normal; there were no regional wall motion abnormalities. Doppler parameters are consistent with abnormal left ventricular relaxation (grade 1 diastolic dysfunction). Doppler parameters are consistent with high ventricular filling pressure. - Aortic valve: There was mild stenosis. There was mild regurgitation. - Mitral valve: There was  mild regurgitation. - Left atrium: The atrium was mildly dilated. - Pulmonary arteries: Systolic pressure was mildly increased. PA peak pressure: 40 mm Hg (S).  Impressions:  - Normal LV function; grade 1 diastolic dysfunction; mild LAE; calcified aortic valve (not well visualized); mild AS by doppler with mean gradient 12 mmHg; mild AI; mild MR; mild TR with mildly elevated pulmonary pressure.    Assessment/Plan:  81 year old female with a history of HTN,  morbid obesity (BMI 52), chronic diastolic heart failure and chronic lower extremity lymphedema. She has had leg swelling bilaterally for > 10 years. She was admitted 10/22/2014 with chronic left foot pain, cellulitis and question sepsis. Her B/P was running in the low 100 range and heart rates in the 50s. Hydralazine, Tekturna, lisinopril, and coreg held.   Principal Problem:   Acute respiratory failure with hypoxia and hypercarbia Active Problems:   Hypothyroidism   Essential hypertension, benign   Chronic venous insufficiency   Chronic diastolic heart failure   Lymphedema of leg   Pulmonary hypertension   Plantar fasciitis of left foot   Renal insufficiency   PLAN: Check f/u K+ (5.5). I think we could resume her Coreg at a lower dose- will review with MD.   Kerin Ransom PA-C 10/16/2014, 10:46 AM [phone] Agree with note written by Kerin Ransom PAC  BP still a little soft. HR increased with some ectopy. Will add back Coreg at a lower dose.   Quay Burow 10/16/2014 11:22 AM

## 2014-10-16 NOTE — Progress Notes (Signed)
[facility] PROGRESS NOTE  Kayla [name] RAQ:[id number] DOB: 1923/07/24 DOA: 10/28/2014 PCP: Viviana Simpler, MD  Assessment/Plan: Principal Problem:   Acute respiratory failure with hypoxia and hypercarbia Active Problems:   Hypothyroidism   Essential hypertension, benign   Chronic venous insufficiency   Chronic diastolic heart failure   Lymphedema of leg   Pulmonary hypertension   Plantar fasciitis of left foot   Renal insufficiency    Acute on chronic  hypoxic and hypercarbic respiratory failure-improving  History of diastolic heart failure , likely obstructive sleep apnea, recent use of long-acting narcotics including fentanyl patch and OxyContin, questionable pneumonia, start zosyn Patient starts to deteriorate , place the patient on BiPAP Recent venous Doppler was negative    Possible plantar fasciitis Lymphedema Avoid NSAIDs and long-acting narcotics, fentanyl patch has been discontinued Use only tylenol  and Ultram as needed PT eval recommends SNF  Hypertension, patient on Tekturna, Coreg, hydralazine, lisinopril, Demadex HEld above due to hypotension and bradycardia , blood pressure is improving Suspect that the patient's clearance is decreased because of acute on chronic renal failure Cardiology consulted Patient also has a UTI and possible pneumonia which could explain her low blood pressure  Acute kidney injury  Baseline creatinine 0.9, slowly improving Patient given another bolus this morning of 500 mL  Chronic diastolic heart failure with an EF of 65-70% Hold diuretic for now Hold  other antihypertensives medications because of low blood pressure and bradycardia  Morbid obesity   Anxiety Started on low dose prn lorazepam   Refusing heparin ,switched to lovenox once a day  Code Status: full Family Communication: daughter  updated about patient's clinical progress Disposition Plan:  Continue stepdown    Brief narrative: 81 y.o. female  with Past medical history of hypertension, hypothyroidism, fibromyalgia, morbid obesity, GERD, chronic diastolic dysfunction, chronic lymphedema, chronic pain syndrome. The patient is presenting with complaints of left heel pain. The pain has been ongoing since last 4 weeks and progressively worsening. She was initially seen in the ER as the pain was making difficult for her to ambulate and she was given oxycodone as well as gabapentin on was discharged home. After going home the family has also increased gabapentin dose as instructed. Despite taking this medication she has been having progressively worsening pain with significantly limited mobility and therefore she presents to ER again. There is no fall trauma or injury reported. She has chronic redness involving the lymphedema bilaterally. The pain is described located in the heel area and is only occurring when she is trying to put any pressure or when she hit that he area with something. She does not have any pain when she is in the bed and at rest. The pain feels like pins and needles and radiates to her head and sometimes to her back as well as rapidly to her neck. No fall no trauma no injury reported. She has chronic constipation and takes stool softener. She has increased dose of OxyContin in the last 2 days. She denies any headache or dizziness. She has dyspnea on eczema which is chronic and has been progressively worsening. She denies any cough denies any chest pain chest tightness. No complaints of fever or chills. No nausea no vomiting no choking episode. No recent changes in her medications reported other than above.  Consultants:  Cardiology  Procedures:  None  Antibiotics: Anti-infectives    Start     Dose/Rate Route Frequency Ordered Stop   10/16/14 0815  piperacillin-tazobactam (ZOSYN) IVPB 3.375 g     3.375 g 12.5 mL/hr over 240 Minutes Intravenous 3 times per day 10/16/14 0805            HPI/Subjective: Patient complains of pain with ambulation, denies any shortness of breath or chest pain Hemodynamically stable his morning   Objective: Filed Vitals:   10/16/14 0038 10/16/14 0458 10/16/14 0545 10/16/14 0729  BP: 100/46 78/55  104/52  Pulse: 58 69  92  Temp: 98.3 F (36.8 C) 98.4 F (36.9 C)  98.4 F (36.9 C)  TempSrc: Axillary Oral  Oral  Resp: 25 71  21  Height:      Weight:   131.8 kg (290 lb 9.1 oz)   SpO2: 96% 64%  92%    Intake/Output Summary (Last 24 hours) at 10/16/14 1038 Last data filed at 10/16/14 0500  Gross per 24 hour  Intake    850 ml  Output      0 ml  Net    850 ml    Exam:  General: No acute respiratory distress Lungs: Clear to auscultation bilaterally without wheezes or crackles Cardiovascular: Regular rate and rhythm without murmur gallop or rub normal S1 and S2 Abdomen: Nontender, nondistended, soft, bowel sounds positive, no rebound, no ascites, no appreciable mass Extremities: Bilateral lymphedema, no calf tenderness Point tenderness at heel on the left foot     Data Review   Micro Results Recent Results (from the past 240 hour(s))  MRSA PCR Screening     Status: None   Collection Time: 10/15/14  3:00 AM  Result Value Ref Range Status   MRSA by PCR NEGATIVE NEGATIVE Final    Comment:        The GeneXpert MRSA Assay (FDA approved for NASAL specimens only), is one component of a comprehensive MRSA colonization surveillance program. It is not intended to diagnose MRSA infection nor to guide or monitor treatment for MRSA infections.   Culture, blood (routine x 2)     Status: None (Preliminary result)   Collection Time: 10/15/14 11:25 AM  Result Value Ref Range Status   Specimen Description BLOOD RIGHT ANTECUBITAL  Final   Special Requests BOTTLES DRAWN AEROBIC ONLY 10CC  Final   Culture   Final           BLOOD CULTURE RECEIVED NO GROWTH TO DATE CULTURE WILL BE HELD FOR 5 DAYS BEFORE ISSUING A FINAL  NEGATIVE REPORT Performed at [facility]    Report Status PENDING  Incomplete  Culture, blood (routine x 2)     Status: None (Preliminary result)   Collection Time: 10/15/14 11:33 AM  Result Value Ref Range Status   Specimen Description BLOOD LEFT ANTECUBITAL  Final   Special Requests BOTTLES DRAWN AEROBIC ONLY 5CC  Final   Culture   Final           BLOOD CULTURE RECEIVED NO GROWTH TO DATE CULTURE WILL BE HELD FOR 5 DAYS BEFORE ISSUING A FINAL NEGATIVE REPORT Performed at [facility]    Report Status PENDING  Incomplete    Radiology Reports Dg Chest 2 View  10/31/2014   CLINICAL DATA:  Bilateral lower extremity pain and swelling  EXAM: CHEST  2 VIEW  COMPARISON:  04/13/2014  FINDINGS: Mild right perihilar scarring, chronic. Mild patchy left lower lobe opacity, atelectasis versus pneumonia. No frank interstitial edema. No pleural effusion or pneumothorax.  Cardiomegaly.  Degenerative changes of the visualized thoracolumbar spine. Stable moderate compression fracture deformity at  T10.  IMPRESSION: Mild patchy left lower lobe opacity, atelectasis versus pneumonia.  No frank interstitial edema.   Electronically Signed   By: Julian Hy M.D.   On: 10/13/2014 20:36   Dg Pelvis 1-2 Views  11/01/2014   CLINICAL DATA:  Bilateral lower extremity pain  EXAM: PELVIS - 1-2 VIEW  COMPARISON:  None.  FINDINGS: No fracture or dislocation is seen.  Bilateral hip joint spaces are mildly narrowed but symmetric.  Visualized bony pelvis appears intact.  IMPRESSION: Negative.   Electronically Signed   By: Julian Hy M.D.   On: 10/18/2014 20:34   Dg Chest Port 1 View  10/15/2014   CLINICAL DATA:  Shortness of breath.  EXAM: PORTABLE CHEST - 1 VIEW  COMPARISON:  10/17/2014  FINDINGS: Cardiac silhouette remains mildly enlarged. Thoracic aortic calcification is noted. Lungs are mildly hypoinflated with mild elevation of the right hemidiaphragm. Mild chronic coarsening of the interstitial  markings is similar to the prior study. There is persistent patchy retrocardiac left lower lobe opacity which is similar to the prior study. More laterally in the left midlung is new opacity which is largely linear in configuration. No overt pulmonary edema, sizable pleural effusion, or pneumothorax is identified. No acute osseous abnormality is seen.  IMPRESSION: Patchy left basilar opacities, mildly increased from prior and favored to represent atelectasis although infection is not excluded.   Electronically Signed   By: Logan Bores   On: 10/15/2014 10:35   Dg Foot Complete Left  10/07/2014   CLINICAL DATA:  Left foot pain.  Lymph edema of the leg.  EXAM: LEFT FOOT - COMPLETE 3+ VIEW  COMPARISON:  None.  FINDINGS: Moderate osteopenia is present. There is fusion across the first, second, third, and fourth TMT joints. Inter Phalen she will joints are fused in the third and fourth digits. No acute fracture is present. Extensive soft tissue swelling is likely related to lymphedema. Vascular calcifications are noted as well.  IMPRESSION: 1. Extensive soft tissue swelling is likely related to lymphedema. No discrete mass is present. 2. Atherosclerotic changes typical of diabetes. 3. Marked osteopenia without acute abnormality of the bones.   Electronically Signed   By: San Morelle M.D.   On: 10/07/2014 13:07     CBC  Recent Labs Lab 10/08/2014 1958 10/15/14 0630 10/16/14 0246  WBC 7.1 6.8 8.1  HGB 10.3* 10.1* 11.2*  HCT 33.8* 33.8* 38.1  PLT 201 200 248  MCV 93.4 96.0 96.9  MCH 28.5 28.7 28.5  MCHC 30.5 29.9* 29.4*  RDW 14.7 15.0 14.8  LYMPHSABS 0.7 0.8  --   MONOABS 0.9 0.8  --   EOSABS 0.2 0.2  --   BASOSABS 0.0 0.0  --     Chemistries   Recent Labs Lab 11/01/2014 1958 10/15/14 0630 10/16/14 0246  NA 139 138 136  K 4.9 5.0 5.5*  CL 107 104 104  CO2 26 29 26   GLUCOSE 119* 108* 100*  BUN 26* 27* 30*  CREATININE 1.34* 1.46* 1.32*  CALCIUM 9.8 9.7 9.6  MG  --  2.1  --   AST  16 15 <5*  ALT 13* 14 15  ALKPHOS 45 41 47  BILITOT 0.5 0.5 0.5   ------------------------------------------------------------------------------------------------------------------ estimated creatinine clearance is 37.8 mL/min (by C-G formula based on Cr of 1.32). ------------------------------------------------------------------------------------------------------------------ No results for input(s): HGBA1C in the last 72 hours. ------------------------------------------------------------------------------------------------------------------ No results for input(s): CHOL, HDL, LDLCALC, TRIG, CHOLHDL, LDLDIRECT in the last 72 hours. ------------------------------------------------------------------------------------------------------------------  Recent Labs  10/15/14 1125  TSH 4.026   ------------------------------------------------------------------------------------------------------------------ No results for input(s): VITAMINB12, FOLATE, FERRITIN, TIBC, IRON, RETICCTPCT in the last 72 hours.  Coagulation profile  Recent Labs Lab 10/15/14 0630  INR 1.39    No results for input(s): DDIMER in the last 72 hours.  Cardiac Enzymes  Recent Labs Lab 10/15/14 0630 10/15/14 1125  TROPONINI <0.03 <0.03   ------------------------------------------------------------------------------------------------------------------ Invalid input(s): POCBNP   CBG: No results for input(s): GLUCAP in the last 168 hours.     Studies: Dg Chest 2 View  10/11/2014   CLINICAL DATA:  Bilateral lower extremity pain and swelling  EXAM: CHEST  2 VIEW  COMPARISON:  04/13/2014  FINDINGS: Mild right perihilar scarring, chronic. Mild patchy left lower lobe opacity, atelectasis versus pneumonia. No frank interstitial edema. No pleural effusion or pneumothorax.  Cardiomegaly.  Degenerative changes of the visualized thoracolumbar spine. Stable moderate compression fracture deformity at T10.  IMPRESSION:  Mild patchy left lower lobe opacity, atelectasis versus pneumonia.  No frank interstitial edema.   Electronically Signed   By: Julian Hy M.D.   On: 10/27/2014 20:36   Dg Pelvis 1-2 Views  11/02/2014   CLINICAL DATA:  Bilateral lower extremity pain  EXAM: PELVIS - 1-2 VIEW  COMPARISON:  None.  FINDINGS: No fracture or dislocation is seen.  Bilateral hip joint spaces are mildly narrowed but symmetric.  Visualized bony pelvis appears intact.  IMPRESSION: Negative.   Electronically Signed   By: Julian Hy M.D.   On: 10/26/2014 20:34   Dg Chest Port 1 View  10/15/2014   CLINICAL DATA:  Shortness of breath.  EXAM: PORTABLE CHEST - 1 VIEW  COMPARISON:  10/25/2014  FINDINGS: Cardiac silhouette remains mildly enlarged. Thoracic aortic calcification is noted. Lungs are mildly hypoinflated with mild elevation of the right hemidiaphragm. Mild chronic coarsening of the interstitial markings is similar to the prior study. There is persistent patchy retrocardiac left lower lobe opacity which is similar to the prior study. More laterally in the left midlung is new opacity which is largely linear in configuration. No overt pulmonary edema, sizable pleural effusion, or pneumothorax is identified. No acute osseous abnormality is seen.  IMPRESSION: Patchy left basilar opacities, mildly increased from prior and favored to represent atelectasis although infection is not excluded.   Electronically Signed   By: Logan Bores   On: 10/15/2014 10:35      Lab Results  Component Value Date   HGBA1C 6.2 09/19/2008   HGBA1C 6.2* 05/31/2008   HGBA1C 6.3* 11/20/2007   Lab Results  Component Value Date   MICROALBUR 0.5 08/11/2006   LDLCALC 96 08/18/2014   CREATININE 1.32* 10/16/2014       Scheduled Meds: . aspirin EC  81 mg Oral Daily  . enoxaparin (LOVENOX) injection  65 mg Subcutaneous Q24H  . nystatin  5 mL Oral TID AC & HS  . nystatin   Topical BID  . piperacillin-tazobactam (ZOSYN)  IV  3.375 g  Intravenous 3 times per day  . senna-docusate  2 tablet Oral QHS  . sodium chloride  3 mL Intravenous Q12H  . triamcinolone  2 spray Nasal Daily   Continuous Infusions:   Principal Problem:   Acute respiratory failure with hypoxia and hypercarbia Active Problems:   Hypothyroidism   Essential hypertension, benign   Chronic venous insufficiency   Chronic diastolic heart failure   Lymphedema of leg   Pulmonary hypertension   Plantar fasciitis of left foot   Renal insufficiency  Time spent: 45 minutes   [facility] Pager [phone]. If 7PM-7AM, please contact night-coverage at [email or web address], password [facility] 10/16/2014, 10:38 AM  LOS: 2 days

## 2014-10-17 ENCOUNTER — Inpatient Hospital Stay (HOSPITAL_COMMUNITY): Payer: Medicare Other

## 2014-10-17 DIAGNOSIS — I872 Venous insufficiency (chronic) (peripheral): Secondary | ICD-10-CM

## 2014-10-17 DIAGNOSIS — R0602 Shortness of breath: Secondary | ICD-10-CM | POA: Insufficient documentation

## 2014-10-17 LAB — BLOOD GAS, ARTERIAL
Acid-Base Excess: 0.6 mmol/L (ref 0.0–2.0)
Acid-Base Excess: 0.6 mmol/L (ref 0.0–2.0)
Acid-base deficit: 0.3 mmol/L (ref 0.0–2.0)
BICARBONATE: 27.3 meq/L — AB (ref 20.0–24.0)
Bicarbonate: 25.9 mEq/L — ABNORMAL HIGH (ref 20.0–24.0)
Bicarbonate: 27.2 mEq/L — ABNORMAL HIGH (ref 20.0–24.0)
DELIVERY SYSTEMS: POSITIVE
DRAWN BY: 406621
DRAWN BY: 437071
Drawn by: 406621
Expiratory PAP: 8
FIO2: 0.4 %
INSPIRATORY PAP: 16
Mode: POSITIVE
O2 Content: 3 L/min
O2 Content: 3 L/min
O2 SAT: 95.7 %
O2 Saturation: 97.1 %
O2 Saturation: 96.1 %
PCO2 ART: 65.8 mmHg — AB (ref 35.0–45.0)
PO2 ART: 97.8 mmHg (ref 80.0–100.0)
Patient temperature: 98.6
Patient temperature: 98.6
Patient temperature: 98.6
TCO2: 27.8 mmol/L (ref 0–100)
TCO2: 29.4 mmol/L (ref 0–100)
TCO2: 29.2 mmol/L (ref 0–100)
pCO2 arterial: 59.7 mmHg (ref 35.0–45.0)
pCO2 arterial: 67.6 mmHg (ref 35.0–45.0)
pH, Arterial: 7.261 — ABNORMAL LOW (ref 7.350–7.450)
pH, Arterial: 7.23 — ABNORMAL LOW (ref 7.350–7.450)
pH, Arterial: 7.24 — ABNORMAL LOW (ref 7.350–7.450)
pO2, Arterial: 105 mmHg — ABNORMAL HIGH (ref 80.0–100.0)
pO2, Arterial: 122 mmHg — ABNORMAL HIGH (ref 80.0–100.0)

## 2014-10-17 LAB — COMPREHENSIVE METABOLIC PANEL
ALK PHOS: 41 U/L (ref 38–126)
ALT: 14 U/L (ref 14–54)
AST: 15 U/L (ref 15–41)
Albumin: 2.9 g/dL — ABNORMAL LOW (ref 3.5–5.0)
Anion gap: 4 — ABNORMAL LOW (ref 5–15)
BUN: 25 mg/dL — AB (ref 6–20)
CO2: 30 mmol/L (ref 22–32)
Calcium: 9.9 mg/dL (ref 8.9–10.3)
Chloride: 106 mmol/L (ref 101–111)
Creatinine, Ser: 1.11 mg/dL — ABNORMAL HIGH (ref 0.44–1.00)
GFR calc Af Amer: 50 mL/min — ABNORMAL LOW (ref >60)
GFR calc non Af Amer: 43 mL/min — ABNORMAL LOW (ref >60)
Glucose, Bld: 105 mg/dL — ABNORMAL HIGH (ref 65–99)
POTASSIUM: 5.5 mmol/L — AB (ref 3.5–5.1)
SODIUM: 140 mmol/L (ref 135–145)
TOTAL PROTEIN: 6.1 g/dL — AB (ref 6.5–8.1)
Total Bilirubin: 0.5 mg/dL (ref 0.3–1.2)

## 2014-10-17 LAB — LACTIC ACID, PLASMA: LACTIC ACID, VENOUS: 0.6 mmol/L (ref 0.5–2.0)

## 2014-10-17 LAB — BRAIN NATRIURETIC PEPTIDE: B Natriuretic Peptide: 178.9 pg/mL — ABNORMAL HIGH (ref 0.0–100.0)

## 2014-10-17 LAB — POTASSIUM: Potassium: 5.1 mmol/L (ref 3.5–5.1)

## 2014-10-17 LAB — PROCALCITONIN: Procalcitonin: <0.1 ng/mL

## 2014-10-17 MED ORDER — TRAMADOL HCL 50 MG PO TABS
50.0000 mg | ORAL_TABLET | Freq: Four times a day (QID) | ORAL | Status: DC | PRN
  Administered 2014-10-17 – 2014-10-22 (×3): 50 mg via ORAL
  Filled 2014-10-17 (×3): qty 1

## 2014-10-17 MED ORDER — HALOPERIDOL LACTATE 5 MG/ML IJ SOLN
1.0000 mg | Freq: Once | INTRAMUSCULAR | Status: AC
  Administered 2014-10-17: 1 mg via INTRAVENOUS
  Filled 2014-10-17: qty 1

## 2014-10-17 MED ORDER — MORPHINE SULFATE 2 MG/ML IJ SOLN
1.0000 mg | Freq: Once | INTRAMUSCULAR | Status: AC
  Administered 2014-10-17: 1 mg via INTRAVENOUS
  Filled 2014-10-17: qty 1

## 2014-10-17 NOTE — Progress Notes (Addendum)
Patient: Kayla [name] / Admit Date: 10/26/2014 / Date of Encounter: 10/17/2014, 8:40 AM   Subjective: Very agitated this AM, calling staff a fraud, convinced that someone has put CO2 in her blood. Having to be told not to hit staff.   Objective: Telemetry: NSR Physical Exam: Blood pressure 141/62, pulse 60, temperature 97.9 F (36.6 C), temperature source Oral, resp. rate 16, height 5\' 2"  (1.575 m), weight 297 lb 2.9 oz (134.8 kg), SpO2 98 %. General: Well developed obese WF in no acute distress. Head: Normocephalic, atraumatic, sclera non-icteric, no xanthomas, nares are without discharge. Neck: JVP not elevated. Lungs: Decreased BS throughout without wheezes, rales, or rhonchi. Breathing is unlabored. Heart: RRR S1 S2 without murmurs, rubs, or gallops.  Abdomen: Soft, non-tender, non-distended with normoactive bowel sounds. No rebound/guarding. Extremities: No clubbing or cyanosis. Bilateral chronic appearing lymphedema changes. Neuro: Alert and oriented X 3 but agitated, shrill tone of voice, tangential   Intake/Output Summary (Last 24 hours) at 10/17/14 0840 Last data filed at 10/17/14 0300  Gross per 24 hour  Intake    173 ml  Output    800 ml  Net   -627 ml    Inpatient Medications:  . aspirin EC  81 mg Oral Daily  . carvedilol  12.5 mg Oral BID WC  . enoxaparin (LOVENOX) injection  65 mg Subcutaneous Q24H  . levalbuterol  1.25 mg Nebulization TID  . nystatin  5 mL Oral TID AC & HS  . nystatin   Topical BID  . piperacillin-tazobactam (ZOSYN)  IV  3.375 g Intravenous 3 times per day  . senna-docusate  2 tablet Oral QHS  . sodium chloride  3 mL Intravenous Q12H  . triamcinolone  2 spray Nasal Daily   Infusions:    Labs:  Recent Labs  10/15/14 0630 10/16/14 0246 10/16/14 1513 10/17/14 0223  NA 138 136  --  140  K 5.0 5.5* 5.0 5.5*  CL 104 104  --  106  CO2 29 26  --  30  GLUCOSE 108* 100*  --  105*  BUN 27* 30*  --  25*  CREATININE 1.46* 1.32*  --  1.11*    CALCIUM 9.7 9.6  --  9.9  MG 2.1  --   --   --     Recent Labs  10/16/14 0246 10/17/14 0223  AST <5* 15  ALT 15 14  ALKPHOS 47 41  BILITOT 0.5 0.5  PROT 6.3* 6.1*  ALBUMIN 3.3* 2.9*    Recent Labs  10/26/2014 1958 10/15/14 0630 10/16/14 0246  WBC 7.1 6.8 8.1  NEUTROABS 5.4 5.0  --   HGB 10.3* 10.1* 11.2*  HCT 33.8* 33.8* 38.1  MCV 93.4 96.0 96.9  PLT 201 200 248    Recent Labs  10/15/14 0630 10/15/14 1125  TROPONINI <0.03 <0.03   Invalid input(s): POCBNP No results for input(s): HGBA1C in the last 72 hours.   Radiology/Studies:  Dg Chest 2 View  10/19/2014   CLINICAL DATA:  Bilateral lower extremity pain and swelling  EXAM: CHEST  2 VIEW  COMPARISON:  04/13/2014  FINDINGS: Mild right perihilar scarring, chronic. Mild patchy left lower lobe opacity, atelectasis versus pneumonia. No frank interstitial edema. No pleural effusion or pneumothorax.  Cardiomegaly.  Degenerative changes of the visualized thoracolumbar spine. Stable moderate compression fracture deformity at T10.  IMPRESSION: Mild patchy left lower lobe opacity, atelectasis versus pneumonia.  No frank interstitial edema.   Electronically Signed   By: Julian Hy  M.D.   On: 10/22/2014 20:36   Dg Pelvis 1-2 Views  10/25/2014   CLINICAL DATA:  Bilateral lower extremity pain  EXAM: PELVIS - 1-2 VIEW  COMPARISON:  None.  FINDINGS: No fracture or dislocation is seen.  Bilateral hip joint spaces are mildly narrowed but symmetric.  Visualized bony pelvis appears intact.  IMPRESSION: Negative.   Electronically Signed   By: Julian Hy M.D.   On: 10/05/2014 20:34   Dg Chest Port 1 View  10/15/2014   CLINICAL DATA:  Shortness of breath.  EXAM: PORTABLE CHEST - 1 VIEW  COMPARISON:  10/26/2014  FINDINGS: Cardiac silhouette remains mildly enlarged. Thoracic aortic calcification is noted. Lungs are mildly hypoinflated with mild elevation of the right hemidiaphragm. Mild chronic coarsening of the interstitial  markings is similar to the prior study. There is persistent patchy retrocardiac left lower lobe opacity which is similar to the prior study. More laterally in the left midlung is new opacity which is largely linear in configuration. No overt pulmonary edema, sizable pleural effusion, or pneumothorax is identified. No acute osseous abnormality is seen.  IMPRESSION: Patchy left basilar opacities, mildly increased from prior and favored to represent atelectasis although infection is not excluded.   Electronically Signed   By: Logan Bores   On: 10/15/2014 10:35   Dg Foot Complete Left  10/07/2014   CLINICAL DATA:  Left foot pain.  Lymph edema of the leg.  EXAM: LEFT FOOT - COMPLETE 3+ VIEW  COMPARISON:  None.  FINDINGS: Moderate osteopenia is present. There is fusion across the first, second, third, and fourth TMT joints. Inter Phalen she will joints are fused in the third and fourth digits. No acute fracture is present. Extensive soft tissue swelling is likely related to lymphedema. Vascular calcifications are noted as well.  IMPRESSION: 1. Extensive soft tissue swelling is likely related to lymphedema. No discrete mass is present. 2. Atherosclerotic changes typical of diabetes. 3. Marked osteopenia without acute abnormality of the bones.   Electronically Signed   By: San Morelle M.D.   On: 10/07/2014 13:07     Assessment and Plan  81 year old female with a history of HTN, morbid obesity (BMI 52), chronic diastolic heart failure, fibromyalgia, chronic pain syndrome, and chronic lower extremity lymphedema with leg swelling bilaterally for > 10 years. She was admitted 10/27/2014 with chronic left foot pain, cellulitis and question sepsis. Her B/P was running in the low 100 range and heart rates in the 50s. Hydralazine, Tekturna, lisinopril, and Coreg were held. Also noted to have hypoxia and hypercarbia likely associated with OHS and increasing use of narcotics. Being treated for plantar fasciitis. Lactic  acid and troponins negative. Mild acute kidney injury which is improving.   1. Acute respiratory failure with hypercarbia and hypoxia and agitation, UTI - on abx per IM as CXR cannot exclude pulm infection. She also has a UTI. Narcotics are being limited as this likely contributed on admission - there is likely a component of narcotic withdrawal. IM will be pursuing bipap this afternoon but will need to see if she tolerates this.  2. Hypotension/sinus bradycardia - improved this AM. She tolerated Coreg initiation yesterday but this has been held this AM due to confusion and agitation. No indication for covering with IV BB at present time.  3. Chronic diastolic CHF - weight has increased, but net fluids are almost even. Volume status is difficult to assess given habitus. Lungs do not sound acutely wet. At this point however may be  helpful to resume diuretic - will f/u BNP this AM and touch base with Dr. Claiborne Billings.  4. Hyperkalemia - offending agents are now on hold. Repeat yesterday afternoon was normal. ? Due to shift in acid-base status. She is now s/p nebs. Will recheck K and consider treatment if this remains elevated.  Signed, Melina Copa PA-C Pager: [phone]  Patient seen and examined. Agree with assessment and plan. Extremely confused and agitated, concerned about a bomb in the bathroom; "4 minutes to live."  Seems psychotic.  Echo reveal abnormal diastolic fxn with abnormal tissue doppler suggesting increased LA pressure.  Will check BNP to assist with volume status determination.  Sinus rhythm  In the 80's. Check electrolytes, Mg, Thyroid studies, ? NH4.   Troy Sine, MD, [facility] 10/17/2014 10:05 AM

## 2014-10-17 NOTE — Clinical Documentation Improvement (Signed)
  Principle diagnosis is currently "acute on chronic respiratory failure" requiring Bipap. Other diagnoses per documentation "pneumonia", "atelectasis", "sepsis", "morbid obesity with OHS". Please clarify Likely/Suspected underlying cause of the acute on chronic respiratory failure.  Possible Conditions -- Atelectasis -- OHS -- Pneumonia -- Sepsis (please indicate if POA) -- Other -- Not able to determine  Risk Factors: -- BMI 52 -- Narcotics in use for pain -- CXR with atelectasis vs pneumonia -- BP low 115/53, 78/37, 118/52 -- Sat's to 80's requiring O2 then Bipap   Thank you,  Ezekiel Ina ,RN Clinical Documentation Specialist:  [place] [facility]

## 2014-10-17 NOTE — Significant Event (Signed)
Patient kept removing bipap off despite reorientation and education. Staff remain in room-patient still removing bipap with staff present, swinging at staff when staff attempted to place bipap back on. Patient is agitated and delirious. Patient's son at bedside and updated. Patient still not compliant even with family at bedside. RN spoke with Dr. Allyson Sabal. Per MD, patient can have bilateral soft wrist restraints.

## 2014-10-17 NOTE — Progress Notes (Signed)
[facility] PROGRESS NOTE  Kayla [name] FFM:[id number] DOB: 03-25-1924 DOA: 10/21/2014 PCP: Viviana Simpler, MD  Assessment/Plan: Principal Problem:   Acute respiratory failure with hypoxia and hypercarbia Active Problems:   Hypothyroidism   Essential hypertension, benign   Chronic venous insufficiency   Chronic diastolic heart failure   Lymphedema of leg   Pulmonary hypertension   Plantar fasciitis of left foot   Renal insufficiency   Bradycardia   Shortness of breath    Acute on chronic  hypoxic and hypercarbic respiratory due to probable pneumonia, worse today History of diastolic heart failure , likely obstructive sleep apnea, recent use of long-acting narcotics including fentanyl patch and OxyContin, questionable pneumonia, start zosyn Patient found to have worsening respiratory acidosis on ABG today We will place the patient on BiPAP, made nothing by mouth Continue Zosyn for pneumonia Recent venous Doppler was negative Discussed with the patient's daughter about patient's worsening hypoxic hypercarbic respiratory failure and agitation If not able to tolerate BiPAP, would pursue comfort care measures, Family to make a decision  Possible plantar fasciitis Lymphedema Avoid NSAIDs and long-acting narcotics, fentanyl patch has been discontinued Use only tylenol  and Ultram as needed PT eval recommends SNF  Hypertension, patient on Tekturna, Coreg, hydralazine, lisinopril, Demadex HEld above due to hypotension and bradycardia , blood pressure is improving Suspect that the patient's clearance is decreased because of acute on chronic renal failure Cardiology consulted , patient on by mouth Coreg  Patient also has a UTI and possible pneumonia which could explain her low blood pressure  Acute kidney injury  Baseline creatinine 0.9, slowly improving    Chronic diastolic heart failure with an EF of 65-70% Volume status is hard to address, can resume low dose Lasix  IV and cardiology recommendations Repeat BMP this afternoon Hold  other antihypertensives medications because of low blood pressure and bradycardia  Morbid obesity   Anxiety Did not tolerate lorazepam, more confused, therefore discontinue  Refusing heparin ,switched to lovenox once a day  Code Status: full Family Communication: daughter  updated about patient's clinical progress Disposition Plan:  Continue stepdown    Brief narrative: 81 y.o. female with Past medical history of hypertension, hypothyroidism, fibromyalgia, morbid obesity, GERD, chronic diastolic dysfunction, chronic lymphedema, chronic pain syndrome. The patient is presenting with complaints of left heel pain. The pain has been ongoing since last 4 weeks and progressively worsening. She was initially seen in the ER as the pain was making difficult for her to ambulate and she was given oxycodone as well as gabapentin on was discharged home. After going home the family has also increased gabapentin dose as instructed. Despite taking this medication she has been having progressively worsening pain with significantly limited mobility and therefore she presents to ER again. There is no fall trauma or injury reported. She has chronic redness involving the lymphedema bilaterally. The pain is described located in the heel area and is only occurring when she is trying to put any pressure or when she hit that he area with something. She does not have any pain when she is in the bed and at rest. The pain feels like pins and needles and radiates to her head and sometimes to her back as well as rapidly to her neck. No fall no trauma no injury reported. She has chronic constipation and takes stool softener. She has increased dose of OxyContin in the last 2 days. She denies any headache or dizziness. She has dyspnea on  eczema which is chronic and has been progressively worsening. She denies any cough denies any chest pain chest  tightness. No complaints of fever or chills. No nausea no vomiting no choking episode. No recent changes in her medications reported other than above.  Consultants:  Cardiology  Procedures:  None  Antibiotics: Anti-infectives    Start     Dose/Rate Route Frequency Ordered Stop   10/16/14 0815  piperacillin-tazobactam (ZOSYN) IVPB 3.375 g     3.375 g 12.5 mL/hr over 240 Minutes Intravenous 3 times per day 10/16/14 0805           HPI/Subjective: Extremely confused, agitated, combative with the nurses  Objective: Filed Vitals:   10/17/14 0500 10/17/14 0731 10/17/14 0835 10/17/14 0900  BP:  141/62  127/108  Pulse:  60  76  Temp:  97.9 F (36.6 C)    TempSrc:  Oral    Resp:  16  25  Height:      Weight: 134.8 kg (297 lb 2.9 oz)     SpO2:  98% 97% 98%    Intake/Output Summary (Last 24 hours) at 10/17/14 1054 Last data filed at 10/17/14 1000  Gross per 24 hour  Intake    223 ml  Output    800 ml  Net   -577 ml    Exam:  General: Confused Lungs: Clear to auscultation bilaterally without wheezes or crackles Cardiovascular: Regular rate and rhythm without murmur gallop or rub normal S1 and S2 Abdomen: Nontender, nondistended, soft, bowel sounds positive, no rebound, no ascites, no appreciable mass Extremities: Bilateral lymphedema, no calf tenderness Point tenderness at heel on the left foot     Data Review   Micro Results Recent Results (from the past 240 hour(s))  MRSA PCR Screening     Status: None   Collection Time: 10/15/14  3:00 AM  Result Value Ref Range Status   MRSA by PCR NEGATIVE NEGATIVE Final    Comment:        The GeneXpert MRSA Assay (FDA approved for NASAL specimens only), is one component of a comprehensive MRSA colonization surveillance program. It is not intended to diagnose MRSA infection nor to guide or monitor treatment for MRSA infections.   Culture, blood (routine x 2)     Status: None (Preliminary result)   Collection  Time: 10/15/14 11:25 AM  Result Value Ref Range Status   Specimen Description BLOOD RIGHT ANTECUBITAL  Final   Special Requests BOTTLES DRAWN AEROBIC ONLY 10CC  Final   Culture   Final           BLOOD CULTURE RECEIVED NO GROWTH TO DATE CULTURE WILL BE HELD FOR 5 DAYS BEFORE ISSUING A FINAL NEGATIVE REPORT Performed at [facility]    Report Status PENDING  Incomplete  Culture, blood (routine x 2)     Status: None (Preliminary result)   Collection Time: 10/15/14 11:33 AM  Result Value Ref Range Status   Specimen Description BLOOD LEFT ANTECUBITAL  Final   Special Requests BOTTLES DRAWN AEROBIC ONLY 5CC  Final   Culture   Final           BLOOD CULTURE RECEIVED NO GROWTH TO DATE CULTURE WILL BE HELD FOR 5 DAYS BEFORE ISSUING A FINAL NEGATIVE REPORT Performed at [facility]    Report Status PENDING  Incomplete    Radiology Reports Dg Chest 2 View  10/10/2014   CLINICAL DATA:  Bilateral lower extremity pain and swelling  EXAM: CHEST  2 VIEW  COMPARISON:  04/13/2014  FINDINGS: Mild right perihilar scarring, chronic. Mild patchy left lower lobe opacity, atelectasis versus pneumonia. No frank interstitial edema. No pleural effusion or pneumothorax.  Cardiomegaly.  Degenerative changes of the visualized thoracolumbar spine. Stable moderate compression fracture deformity at T10.  IMPRESSION: Mild patchy left lower lobe opacity, atelectasis versus pneumonia.  No frank interstitial edema.   Electronically Signed   By: Julian Hy M.D.   On: 10/11/2014 20:36   Dg Pelvis 1-2 Views  10/18/2014   CLINICAL DATA:  Bilateral lower extremity pain  EXAM: PELVIS - 1-2 VIEW  COMPARISON:  None.  FINDINGS: No fracture or dislocation is seen.  Bilateral hip joint spaces are mildly narrowed but symmetric.  Visualized bony pelvis appears intact.  IMPRESSION: Negative.   Electronically Signed   By: Julian Hy M.D.   On: 10/12/2014 20:34   Dg Chest Port 1 View  10/15/2014   CLINICAL  DATA:  Shortness of breath.  EXAM: PORTABLE CHEST - 1 VIEW  COMPARISON:  10/19/2014  FINDINGS: Cardiac silhouette remains mildly enlarged. Thoracic aortic calcification is noted. Lungs are mildly hypoinflated with mild elevation of the right hemidiaphragm. Mild chronic coarsening of the interstitial markings is similar to the prior study. There is persistent patchy retrocardiac left lower lobe opacity which is similar to the prior study. More laterally in the left midlung is new opacity which is largely linear in configuration. No overt pulmonary edema, sizable pleural effusion, or pneumothorax is identified. No acute osseous abnormality is seen.  IMPRESSION: Patchy left basilar opacities, mildly increased from prior and favored to represent atelectasis although infection is not excluded.   Electronically Signed   By: Logan Bores   On: 10/15/2014 10:35   Dg Foot Complete Left  10/07/2014   CLINICAL DATA:  Left foot pain.  Lymph edema of the leg.  EXAM: LEFT FOOT - COMPLETE 3+ VIEW  COMPARISON:  None.  FINDINGS: Moderate osteopenia is present. There is fusion across the first, second, third, and fourth TMT joints. Inter Phalen she will joints are fused in the third and fourth digits. No acute fracture is present. Extensive soft tissue swelling is likely related to lymphedema. Vascular calcifications are noted as well.  IMPRESSION: 1. Extensive soft tissue swelling is likely related to lymphedema. No discrete mass is present. 2. Atherosclerotic changes typical of diabetes. 3. Marked osteopenia without acute abnormality of the bones.   Electronically Signed   By: San Morelle M.D.   On: 10/07/2014 13:07     CBC  Recent Labs Lab 10/21/2014 1958 10/15/14 0630 10/16/14 0246  WBC 7.1 6.8 8.1  HGB 10.3* 10.1* 11.2*  HCT 33.8* 33.8* 38.1  PLT 201 200 248  MCV 93.4 96.0 96.9  MCH 28.5 28.7 28.5  MCHC 30.5 29.9* 29.4*  RDW 14.7 15.0 14.8  LYMPHSABS 0.7 0.8  --   MONOABS 0.9 0.8  --   EOSABS 0.2 0.2   --   BASOSABS 0.0 0.0  --     Chemistries   Recent Labs Lab 10/13/2014 1958 10/15/14 0630 10/16/14 0246 10/16/14 1513 10/17/14 0223  NA 139 138 136  --  140  K 4.9 5.0 5.5* 5.0 5.5*  CL 107 104 104  --  106  CO2 26 29 26   --  30  GLUCOSE 119* 108* 100*  --  105*  BUN 26* 27* 30*  --  25*  CREATININE 1.34* 1.46* 1.32*  --  1.11*  CALCIUM 9.8 9.7 9.6  --  9.9  MG  --  2.1  --   --   --   AST 16 15 <5*  --  15  ALT 13* 14 15  --  14  ALKPHOS 45 41 47  --  41  BILITOT 0.5 0.5 0.5  --  0.5   ------------------------------------------------------------------------------------------------------------------ estimated creatinine clearance is 45.6 mL/min (by C-G formula based on Cr of 1.11). ------------------------------------------------------------------------------------------------------------------ No results for input(s): HGBA1C in the last 72 hours. ------------------------------------------------------------------------------------------------------------------ No results for input(s): CHOL, HDL, LDLCALC, TRIG, CHOLHDL, LDLDIRECT in the last 72 hours. ------------------------------------------------------------------------------------------------------------------  Recent Labs  10/15/14 1125  TSH 4.026   ------------------------------------------------------------------------------------------------------------------ No results for input(s): VITAMINB12, FOLATE, FERRITIN, TIBC, IRON, RETICCTPCT in the last 72 hours.  Coagulation profile  Recent Labs Lab 10/15/14 0630  INR 1.39    No results for input(s): DDIMER in the last 72 hours.  Cardiac Enzymes  Recent Labs Lab 10/15/14 0630 10/15/14 1125  TROPONINI <0.03 <0.03   ------------------------------------------------------------------------------------------------------------------ Invalid input(s): POCBNP   CBG: No results for input(s): GLUCAP in the last 168 hours.     Studies: No results  found.    Lab Results  Component Value Date   HGBA1C 6.2 09/19/2008   HGBA1C 6.2* 05/31/2008   HGBA1C 6.3* 11/20/2007   Lab Results  Component Value Date   MICROALBUR 0.5 08/11/2006   LDLCALC 96 08/18/2014   CREATININE 1.11* 10/17/2014       Scheduled Meds: . aspirin EC  81 mg Oral Daily  . carvedilol  12.5 mg Oral BID WC  . enoxaparin (LOVENOX) injection  65 mg Subcutaneous Q24H  . levalbuterol  1.25 mg Nebulization TID  . nystatin  5 mL Oral TID AC & HS  . nystatin   Topical BID  . piperacillin-tazobactam (ZOSYN)  IV  3.375 g Intravenous 3 times per day  . senna-docusate  2 tablet Oral QHS  . sodium chloride  3 mL Intravenous Q12H  . triamcinolone  2 spray Nasal Daily   Continuous Infusions:   Principal Problem:   Acute respiratory failure with hypoxia and hypercarbia Active Problems:   Hypothyroidism   Essential hypertension, benign   Chronic venous insufficiency   Chronic diastolic heart failure   Lymphedema of leg   Pulmonary hypertension   Plantar fasciitis of left foot   Renal insufficiency   Bradycardia   Shortness of breath    Time spent: 45 minutes   [facility] Pager [phone]. If 7PM-7AM, please contact night-coverage at [email or web address], password [facility] 10/17/2014, 10:54 AM  LOS: 3 days

## 2014-10-17 NOTE — Significant Event (Signed)
This RN removed fentanyl patch that was on patient's back, per verbal order from Dr. Allyson Sabal who had relayed to Justice Rocher, RN who was caring for patient prior to me.

## 2014-10-17 NOTE — Progress Notes (Signed)
SLP Cancellation Note  Patient Details Name: Kayla [name] MRN: [id number] DOB: 1924-05-01   Cancelled treatment:        Pt on Bipap when SLP checked one hour ago. ST will check next date for appropriateness to initiate swallow assessment.   Houston Siren 10/17/2014, 3:24 PM   Orbie Pyo Colvin Caroli.Ed [employer] [phone]

## 2014-10-17 NOTE — Significant Event (Signed)
Latest ABG as below. Spoke with Dr. Allyson Sabal. New orders for RRT to adjust Bipap settings.   Results for MEGEN, MADEWELL (MRN [id number]) as of 10/17/2014 17:42  Ref. Range 10/17/2014 16:05  Sample type Unknown ARTERIAL DRAW  Delivery systems Unknown NASAL CANNULA  O2 Content Latest Units: L/min 3.0  pH, Arterial Latest Ref Range: 7.350-7.450  7.230 (L)  pCO2 arterial Latest Ref Range: 35.0-45.0 mmHg 67.6 (HH)  pO2, Arterial Latest Ref Range: 80.0-100.0 mmHg 97.8  Bicarbonate Latest Ref Range: 20.0-24.0 mEq/L 27.3 (H)  TCO2 Latest Ref Range: 0-100 mmol/L 29.4  Acid-Base Excess Latest Ref Range: 0.0-2.0 mmol/L 0.6  O2 Saturation Latest Units: % 97.1  Patient temperature Unknown 98.6  Collection site Unknown LEFT RADIAL  Allens test (pass/fail) Latest Ref Range: PASS  PASS

## 2014-10-17 NOTE — Progress Notes (Signed)
Took pt off bipap at this time as pt had started to remove it herself. Placed pt on 3L [place]. Will check scheduled ABG prior to placing pt back on bipap

## 2014-10-17 NOTE — Significant Event (Signed)
Patient will not keep Bipap in place, kept removing it, patient is very confused, agitated, and delirious, stating there is a bomb in the hall, wanting staff to get "saline, get the bomb guy in here, get the cleaning lady in here right now". RN remained in room with patient- patient still kept taking the bipap off. Attempted mittens, but it made patient even more agitated. MD notified-new orders received for morphine 1mg  and haldol 1mg . Will continue to monitor. Xerxes Agrusa, Therapist, sports.

## 2014-10-17 NOTE — Progress Notes (Signed)
OT Cancellation Note  Patient Details Name: Kayla [name] MRN: [id number] DOB: [birth year]   Cancelled Treatment:    Reason Eval/Treat Not Completed: Medical issues which prohibited therapy. Pt agitated with paranoia, currently on bipap. Unable to participate in OT evaluation. Will continue to follow.  Malka So 10/17/2014, 10:58 AM  [phone]

## 2014-10-17 NOTE — Progress Notes (Signed)
BNP only 178. K improved. F/u weight, net I/O in AM. Melina Copa PA-C

## 2014-10-18 LAB — BLOOD GAS, ARTERIAL
ACID-BASE EXCESS: 4.5 mmol/L — AB (ref 0.0–2.0)
Acid-Base Excess: 1.6 mmol/L (ref 0.0–2.0)
BICARBONATE: 30.7 meq/L — AB (ref 20.0–24.0)
Bicarbonate: 27.6 mEq/L — ABNORMAL HIGH (ref 20.0–24.0)
Delivery systems: POSITIVE
Drawn by: 35849
Drawn by: 31814
EXPIRATORY PAP: 9
FIO2: 0.4 %
FIO2: 0.44 %
INSPIRATORY PAP: 16
O2 Saturation: 98.4 %
O2 Saturation: 97.6 %
PATIENT TEMPERATURE: 98.6
PCO2 ART: 59.7 mmHg — AB (ref 35.0–45.0)
PCO2 ART: 67.6 mmHg — AB (ref 35.0–45.0)
PO2 ART: 130 mmHg — AB (ref 80.0–100.0)
Patient temperature: 99.4
TCO2: 29.4 mmol/L (ref 0–100)
TCO2: 32.7 mmol/L (ref 0–100)
pH, Arterial: 7.286 — ABNORMAL LOW (ref 7.350–7.450)
pH, Arterial: 7.282 — ABNORMAL LOW (ref 7.350–7.450)
pO2, Arterial: 159 mmHg — ABNORMAL HIGH (ref 80.0–100.0)

## 2014-10-18 LAB — URINE CULTURE: SPECIAL REQUESTS: NORMAL

## 2014-10-18 LAB — COMPREHENSIVE METABOLIC PANEL
ALK PHOS: 48 U/L (ref 38–126)
ALT: 14 U/L (ref 14–54)
ANION GAP: 9 (ref 5–15)
AST: 21 U/L (ref 15–41)
Albumin: 2.9 g/dL — ABNORMAL LOW (ref 3.5–5.0)
BUN: 23 mg/dL — AB (ref 6–20)
CO2: 26 mmol/L (ref 22–32)
Calcium: 10.1 mg/dL (ref 8.9–10.3)
Chloride: 104 mmol/L (ref 101–111)
Creatinine, Ser: 1.06 mg/dL — ABNORMAL HIGH (ref 0.44–1.00)
GFR calc Af Amer: 52 mL/min — ABNORMAL LOW (ref >60)
GFR calc non Af Amer: 45 mL/min — ABNORMAL LOW (ref >60)
Glucose, Bld: 94 mg/dL (ref 65–99)
POTASSIUM: 5.1 mmol/L (ref 3.5–5.1)
Sodium: 139 mmol/L (ref 135–145)
TOTAL PROTEIN: 6.4 g/dL — AB (ref 6.5–8.1)
Total Bilirubin: 1 mg/dL (ref 0.3–1.2)

## 2014-10-18 LAB — BRAIN NATRIURETIC PEPTIDE: B Natriuretic Peptide: 217.8 pg/mL — ABNORMAL HIGH (ref 0.0–100.0)

## 2014-10-18 MED ORDER — FUROSEMIDE 10 MG/ML IJ SOLN
20.0000 mg | Freq: Once | INTRAMUSCULAR | Status: AC
  Administered 2014-10-18: 20 mg via INTRAVENOUS
  Filled 2014-10-18: qty 2

## 2014-10-18 NOTE — Care Management Note (Signed)
Case Management Note  Patient Details  Name: Kayla [name] MRN: [id number] Date of Birth: 02/22/1924  Subjective/Objective:     Pt lives with dtr, is active with [facility] for nursing, has 24/7 paid caregiver.               Action/Plan: Follow for d/c needs, provide information re progression to [facility] liaison, Doreatha Lew [phone]).   Expected Discharge Date:                  Expected Discharge Plan:  [place]  In-House Referral:     Discharge planning Services  CM Consult  Post Acute Care Choice:    Choice offered to:     DME Arranged:    DME Agency:     HH Arranged:    [place] Agency:     Status of Service:  In process, will continue to follow  Medicare Important Message Given:  Yes Date Medicare IM Given:  10/18/14 Medicare IM give by:  Babette Relic RN Date Additional Medicare IM Given:    Additional Medicare Important Message give by:     If discussed at [place] of Stay Meetings, dates discussed:    Additional Comments:  Girard Cooter, RN 10/18/2014, 12:11 PM

## 2014-10-18 NOTE — Progress Notes (Signed)
Subjective:  Remains confused but less agitated  Objective:   Vital Signs : Filed Vitals:   10/18/14 0429 10/18/14 0822 10/18/14 0837 10/18/14 0838  BP: 137/69 165/70  165/70  Pulse: 78 69  78  Temp:  98 F (36.7 C)    TempSrc:  Axillary    Resp: 22 18  18   Height:      Weight:      SpO2: 97% 98% 98% 100%    Intake/Output from previous day:  Intake/Output Summary (Last 24 hours) at 10/18/14 0943 Last data filed at 10/17/14 2118  Gross per 24 hour  Intake    150 ml  Output      0 ml  Net    150 ml   I/O since admission: +616  Wt Readings from Last 3 Encounters:  10/18/14 288 lb 2.3 oz (130.7 kg)  08/18/14 286 lb (129.729 kg)  06/15/14 289 lb (131.09 kg)    Medications: . aspirin EC  81 mg Oral Daily  . carvedilol  12.5 mg Oral BID WC  . enoxaparin (LOVENOX) injection  65 mg Subcutaneous Q24H  . levalbuterol  1.25 mg Nebulization TID  . nystatin  5 mL Oral TID AC & HS  . nystatin   Topical BID  . piperacillin-tazobactam (ZOSYN)  IV  3.375 g Intravenous 3 times per day  . senna-docusate  2 tablet Oral QHS  . sodium chloride  3 mL Intravenous Q12H  . triamcinolone  2 spray Nasal Daily       Physical Exam:   General appearance: No longer combative;  on BiPAP now Neck: no carotid bruit, no JVD and thyroid not enlarged, symmetric, no tenderness/mass/nodules Lungs: no wheezing Heart: regular rate and rhythm Abdomen: central adiposity; soft, non-tender; bowel sounds normal; no masses,  no organomegaly Extremities: large legs,  Neurologic: Grossly normal   Rate: 82  Rhythm: normal sinus rhythm  Last ECG done on 10/15/14  (independently read by me): SB at 51 with 1st degree AV block  Lab Results:   Recent Labs  10/16/14 0246  10/17/14 0223 10/17/14 1230 10/18/14 0220  NA 136  --  140  --  139  K 5.5*  < > 5.5* 5.1 5.1  CL 104  --  106  --  104  CO2 26  --  30  --  26  GLUCOSE 100*  --  105*  --  94  BUN 30*  --  25*  --  23*  CREATININE 1.32*   --  1.11*  --  1.06*  CALCIUM 9.6  --  9.9  --  10.1  < > = values in this interval not displayed.   Recent Labs  10/16/14 0246  WBC 8.1  HGB 11.2*  HCT 38.1  MCV 96.9  PLT 248     Recent Labs  10/15/14 1125  TROPONINI <0.03    Lab Results  Component Value Date   TSH 4.026 10/15/2014     Recent Labs  10/16/14 0246 10/17/14 0223 10/18/14 0220  PROT 6.3* 6.1* 6.4*  ALBUMIN 3.3* 2.9* 2.9*  AST <5* 15 21  ALT 15 14 14   ALKPHOS 47 41 48  BILITOT 0.5 0.5 1.0   No results for input(s): INR in the last 72 hours. BNP (last 3 results)  Recent Labs  10/18/2014 1958 10/17/14 1232 10/18/14 0220  BNP 212.6* 178.9* 217.8*    ProBNP (last 3 results)  Recent Labs  04/13/14 1556  PROBNP 470.9*  Lipid Panel     Component Value Date/Time   CHOL 161 08/18/2014 1447   TRIG 95.0 08/18/2014 1447   HDL 46.40 08/18/2014 1447   CHOLHDL 3 08/18/2014 1447   VLDL 19.0 08/18/2014 1447   LDLCALC 96 08/18/2014 1447   LDLDIRECT 134.3 08/11/2006 1213    ABG this am: 7.28/59/7/159/27.6 on BiPAP 16/9   No results for input(s): HGBA1C in the last 72 hours.   Imaging:  Dg Chest Port 1 View  10/17/2014   CLINICAL DATA:  Shortness of breath for 1 day.  EXAM: PORTABLE CHEST - 1 VIEW  COMPARISON:  Single view of the chest 10/15/2014. PA and lateral chest 10/08/2014.  FINDINGS: Lung volumes are low. There is cardiomegaly and mild interstitial edema. Patchy airspace disease in the left mid and lower lung zones does not appear notably change compared to the most recent exam.  IMPRESSION: Cardiomegaly and mild interstitial edema.  No marked change in patchy left mid and lower lung zone airspace disease which could be due to atelectasis or infection.   Electronically Signed   By: Inge Rise M.D.   On: 10/17/2014 19:50      Assessment/Plan:   81 year old female with a history of HTN, morbid obesity (BMI 52), chronic diastolic heart failure, fibromyalgia, chronic pain  syndrome, and chronic lower extremity lymphedema with leg swelling bilaterally for > 10 years. She was admitted 10/13/2014 with chronic left foot pain, cellulitis and question sepsis. Her B/P was running in the low 100 range and heart rates in the 50s. Hydralazine, Tekturna, lisinopril, and Coreg were held. Also noted to have hypoxia and hypercarbia likely associated with OHS and increasing use of narcotics. Being treated for plantar fasciitis. Lactic acid and troponins negative. Mild acute kidney injury which is improving.    Principal Problem:   Acute respiratory failure with hypoxia and hypercarbia Active Problems:   Hypothyroidism   Essential hypertension, benign   Chronic venous insufficiency   Chronic diastolic heart failure   Lymphedema of leg   Pulmonary hypertension   Plantar fasciitis of left foot   Renal insufficiency   Bradycardia   Shortness of breath   1. Acute respiratory failure with hypercarbia and hypoxia and agitation, UTI - on abx per IM as CXR cannot exclude pulm infection. She also has a UTI. Narcotics are being limited as this likely contributed on admission - there is likely a component of narcotic withdrawal.   2. Hypotension/sinus bradycardia - improved this AM. She tolerated Coreg initiation but this was held yesterday due to confusion and agitation.   3. Chronic diastolic CHF -  I/O +219 since admission.  BNP today is 217.  EF 55 - 60% with grade 1 diastolic dysfunction on last echo 06/2014. Will give lasix 20 mg iv today.  4. Hyperkalemia - 5.1 today improved from 5.5  5. Anxiety/confusion: better today  6. Lymphedema  Troy Sine, MD, [facility] 10/18/2014, 9:43 AM

## 2014-10-18 NOTE — Progress Notes (Addendum)
[facility] PROGRESS NOTE  Kayla [name] PJA:[id number] DOB: [birth year] DOA: 10/15/2014 PCP: Viviana Simpler, MD  Assessment/Plan: Principal Problem:   Acute respiratory failure with hypoxia and hypercarbia Active Problems:   Hypothyroidism   Essential hypertension, benign   Chronic venous insufficiency   Chronic diastolic heart failure   Lymphedema of leg   Pulmonary hypertension   Plantar fasciitis of left foot   Renal insufficiency   Bradycardia   Shortness of breath    Acute on chronic  hypoxic and hypercarbic respiratory due to probable pneumonia, worse today History of diastolic heart failure , likely obstructive sleep apnea, recent use of long-acting narcotics including fentanyl patch and OxyContin, questionable pneumonia, cont zosyn Patient found to have worsening respiratory acidosis on ABG ,minimial improvement with BiPAP Continue  BiPAP, made npo Continue Zosyn for pneumonia Recent venous Doppler was negative Discussed with the patient's daughter about patient's worsening hypoxic hypercarbic respiratory failure and agitation If not able to tolerate BiPAP, would pursue comfort care measures, Palliative care consult requested , will repeat ABG in the evening  Possible plantar fasciitis Lymphedema Avoid NSAIDs and long-acting narcotics, fentanyl patch has been discontinued Use only tylenol  and Ultram as needed PT eval recommends SNF  Hypertension, patient on Tekturna, Coreg, hydralazine, lisinopril, Demadex HEld above due to hypotension and bradycardia upon admission , blood pressure is improving Suspect that the patient's clearance is decreased because of acute on chronic renal failure Cardiology consulted , patient on by mouth Coreg  Patient also has a UTI and possible pneumonia which could explain her low blood pressure improved this AM   Acute kidney injury  Baseline creatinine 0.9, slowly improving   Chronic diastolic heart failure with an EF of  65-70% Volume status is hard to address, can resume low dose Lasix IV per  cardiology recommendations Repeat BMP this afternoon Hold  other antihypertensives medications because of low blood pressure and bradycardia   Morbid obesity    Anxiety Did not tolerate lorazepam, more confused, therefore discontinue  DVT prophylaxsis  lovenox once a day  Code Status: full Family Communication: daughter  updated about patient's clinical progress Disposition Plan:  Continue stepdown, confused , will request palliative care consult to have family decide on [place]     Brief narrative: 81 y.o. female with Past medical history of hypertension, hypothyroidism, fibromyalgia, morbid obesity, GERD, chronic diastolic dysfunction, chronic lymphedema, chronic pain syndrome. The patient is presenting with complaints of left heel pain. The pain has been ongoing since last 4 weeks and progressively worsening. She was initially seen in the ER as the pain was making difficult for her to ambulate and she was given oxycodone as well as gabapentin on was discharged home. After going home the family has also increased gabapentin dose as instructed. Despite taking this medication she has been having progressively worsening pain with significantly limited mobility and therefore she presents to ER again. There is no fall trauma or injury reported. She has chronic redness involving the lymphedema bilaterally. The pain is described located in the heel area and is only occurring when she is trying to put any pressure or when she hit that he area with something. She does not have any pain when she is in the bed and at rest. The pain feels like pins and needles and radiates to her head and sometimes to her back as well as rapidly to her neck. No fall no trauma no injury reported. She has chronic constipation and  takes stool softener. She has increased dose of OxyContin in the last 2 days. She denies any headache or  dizziness. She has dyspnea on eczema which is chronic and has been progressively worsening. She denies any cough denies any chest pain chest tightness. No complaints of fever or chills. No nausea no vomiting no choking episode. No recent changes in her medications reported other than above.  Consultants:  Cardiology  Procedures:  None  Antibiotics: Anti-infectives    Start     Dose/Rate Route Frequency Ordered Stop   10/16/14 0815  piperacillin-tazobactam (ZOSYN) IVPB 3.375 g     3.375 g 12.5 mL/hr over 240 Minutes Intravenous 3 times per day 10/16/14 0805           HPI/Subjective: Patient kept removing bipap off despite reorientation and educationcombative with the nurses  Objective: Filed Vitals:   10/18/14 0429 10/18/14 0822 10/18/14 0837 10/18/14 0838  BP: 137/69 165/70  165/70  Pulse: 78 69  78  Temp:  98 F (36.7 C)    TempSrc:  Axillary    Resp: 22 18  18   Height:      Weight:      SpO2: 97% 98% 98% 100%    Intake/Output Summary (Last 24 hours) at 10/18/14 1021 Last data filed at 10/18/14 0946  Gross per 24 hour  Intake    103 ml  Output      0 ml  Net    103 ml    Exam:  General: Confused Lungs: Clear to auscultation bilaterally without wheezes or crackles Cardiovascular: Regular rate and rhythm without murmur gallop or rub normal S1 and S2 Abdomen: Nontender, nondistended, soft, bowel sounds positive, no rebound, no ascites, no appreciable mass Extremities: Bilateral lymphedema, no calf tenderness Point tenderness at heel on the left foot     Data Review   Micro Results Recent Results (from the past 240 hour(s))  MRSA PCR Screening     Status: None   Collection Time: 10/15/14  3:00 AM  Result Value Ref Range Status   MRSA by PCR NEGATIVE NEGATIVE Final    Comment:        The GeneXpert MRSA Assay (FDA approved for NASAL specimens only), is one component of a comprehensive MRSA colonization surveillance program. It is not intended  to diagnose MRSA infection nor to guide or monitor treatment for MRSA infections.   Culture, blood (routine x 2)     Status: None (Preliminary result)   Collection Time: 10/15/14 11:25 AM  Result Value Ref Range Status   Specimen Description BLOOD RIGHT ANTECUBITAL  Final   Special Requests BOTTLES DRAWN AEROBIC ONLY 10CC  Final   Culture   Final           BLOOD CULTURE RECEIVED NO GROWTH TO DATE CULTURE WILL BE HELD FOR 5 DAYS BEFORE ISSUING A FINAL NEGATIVE REPORT Performed at [facility]    Report Status PENDING  Incomplete  Culture, blood (routine x 2)     Status: None (Preliminary result)   Collection Time: 10/15/14 11:33 AM  Result Value Ref Range Status   Specimen Description BLOOD LEFT ANTECUBITAL  Final   Special Requests BOTTLES DRAWN AEROBIC ONLY 5CC  Final   Culture   Final           BLOOD CULTURE RECEIVED NO GROWTH TO DATE CULTURE WILL BE HELD FOR 5 DAYS BEFORE ISSUING A FINAL NEGATIVE REPORT Performed at [facility]    Report Status PENDING  Incomplete  Urine culture     Status: None   Collection Time: 10/17/14  3:06 AM  Result Value Ref Range Status   Specimen Description URINE, RANDOM  Final   Special Requests Normal  Final   Colony Count   Final    25,000 COLONIES/ML Performed at [facility]    Culture   Final    Multiple bacterial morphotypes present, none predominant. Suggest appropriate recollection if clinically indicated. Performed at [facility]    Report Status 10/18/2014 FINAL  Final    Radiology Reports Dg Chest 2 View  10/19/2014   CLINICAL DATA:  Bilateral lower extremity pain and swelling  EXAM: CHEST  2 VIEW  COMPARISON:  04/13/2014  FINDINGS: Mild right perihilar scarring, chronic. Mild patchy left lower lobe opacity, atelectasis versus pneumonia. No frank interstitial edema. No pleural effusion or pneumothorax.  Cardiomegaly.  Degenerative changes of the visualized thoracolumbar spine. Stable moderate  compression fracture deformity at T10.  IMPRESSION: Mild patchy left lower lobe opacity, atelectasis versus pneumonia.  No frank interstitial edema.   Electronically Signed   By: Julian Hy M.D.   On: 10/15/2014 20:36   Dg Pelvis 1-2 Views  10/25/2014   CLINICAL DATA:  Bilateral lower extremity pain  EXAM: PELVIS - 1-2 VIEW  COMPARISON:  None.  FINDINGS: No fracture or dislocation is seen.  Bilateral hip joint spaces are mildly narrowed but symmetric.  Visualized bony pelvis appears intact.  IMPRESSION: Negative.   Electronically Signed   By: Julian Hy M.D.   On: 10/13/2014 20:34   Dg Chest Port 1 View  10/17/2014   CLINICAL DATA:  Shortness of breath for 1 day.  EXAM: PORTABLE CHEST - 1 VIEW  COMPARISON:  Single view of the chest 10/15/2014. PA and lateral chest 10/08/2014.  FINDINGS: Lung volumes are low. There is cardiomegaly and mild interstitial edema. Patchy airspace disease in the left mid and lower lung zones does not appear notably change compared to the most recent exam.  IMPRESSION: Cardiomegaly and mild interstitial edema.  No marked change in patchy left mid and lower lung zone airspace disease which could be due to atelectasis or infection.   Electronically Signed   By: Inge Rise M.D.   On: 10/17/2014 19:50   Dg Chest Port 1 View  10/15/2014   CLINICAL DATA:  Shortness of breath.  EXAM: PORTABLE CHEST - 1 VIEW  COMPARISON:  10/09/2014  FINDINGS: Cardiac silhouette remains mildly enlarged. Thoracic aortic calcification is noted. Lungs are mildly hypoinflated with mild elevation of the right hemidiaphragm. Mild chronic coarsening of the interstitial markings is similar to the prior study. There is persistent patchy retrocardiac left lower lobe opacity which is similar to the prior study. More laterally in the left midlung is new opacity which is largely linear in configuration. No overt pulmonary edema, sizable pleural effusion, or pneumothorax is identified. No acute  osseous abnormality is seen.  IMPRESSION: Patchy left basilar opacities, mildly increased from prior and favored to represent atelectasis although infection is not excluded.   Electronically Signed   By: Logan Bores   On: 10/15/2014 10:35   Dg Foot Complete Left  10/07/2014   CLINICAL DATA:  Left foot pain.  Lymph edema of the leg.  EXAM: LEFT FOOT - COMPLETE 3+ VIEW  COMPARISON:  None.  FINDINGS: Moderate osteopenia is present. There is fusion across the first, second, third, and fourth TMT joints. Inter Phalen she will joints are fused in the third  and fourth digits. No acute fracture is present. Extensive soft tissue swelling is likely related to lymphedema. Vascular calcifications are noted as well.  IMPRESSION: 1. Extensive soft tissue swelling is likely related to lymphedema. No discrete mass is present. 2. Atherosclerotic changes typical of diabetes. 3. Marked osteopenia without acute abnormality of the bones.   Electronically Signed   By: San Morelle M.D.   On: 10/07/2014 13:07     CBC  Recent Labs Lab 10/16/2014 1958 10/15/14 0630 10/16/14 0246  WBC 7.1 6.8 8.1  HGB 10.3* 10.1* 11.2*  HCT 33.8* 33.8* 38.1  PLT 201 200 248  MCV 93.4 96.0 96.9  MCH 28.5 28.7 28.5  MCHC 30.5 29.9* 29.4*  RDW 14.7 15.0 14.8  LYMPHSABS 0.7 0.8  --   MONOABS 0.9 0.8  --   EOSABS 0.2 0.2  --   BASOSABS 0.0 0.0  --     Chemistries   Recent Labs Lab 10/13/2014 1958 10/15/14 0630 10/16/14 0246 10/16/14 1513 10/17/14 0223 10/17/14 1230 10/18/14 0220  NA 139 138 136  --  140  --  139  K 4.9 5.0 5.5* 5.0 5.5* 5.1 5.1  CL 107 104 104  --  106  --  104  CO2 26 29 26   --  30  --  26  GLUCOSE 119* 108* 100*  --  105*  --  94  BUN 26* 27* 30*  --  25*  --  23*  CREATININE 1.34* 1.46* 1.32*  --  1.11*  --  1.06*  CALCIUM 9.8 9.7 9.6  --  9.9  --  10.1  MG  --  2.1  --   --   --   --   --   AST 16 15 <5*  --  15  --  21  ALT 13* 14 15  --  14  --  14  ALKPHOS 45 41 47  --  41  --  48   BILITOT 0.5 0.5 0.5  --  0.5  --  1.0   ------------------------------------------------------------------------------------------------------------------ estimated creatinine clearance is 46.7 mL/min (by C-G formula based on Cr of 1.06). ------------------------------------------------------------------------------------------------------------------ No results for input(s): HGBA1C in the last 72 hours. ------------------------------------------------------------------------------------------------------------------ No results for input(s): CHOL, HDL, LDLCALC, TRIG, CHOLHDL, LDLDIRECT in the last 72 hours. ------------------------------------------------------------------------------------------------------------------  Recent Labs  10/15/14 1125  TSH 4.026   ------------------------------------------------------------------------------------------------------------------ No results for input(s): VITAMINB12, FOLATE, FERRITIN, TIBC, IRON, RETICCTPCT in the last 72 hours.  Coagulation profile  Recent Labs Lab 10/15/14 0630  INR 1.39    No results for input(s): DDIMER in the last 72 hours.  Cardiac Enzymes  Recent Labs Lab 10/15/14 0630 10/15/14 1125  TROPONINI <0.03 <0.03   ------------------------------------------------------------------------------------------------------------------ Invalid input(s): POCBNP   CBG: No results for input(s): GLUCAP in the last 168 hours.     Studies: Dg Chest Port 1 View  10/17/2014   CLINICAL DATA:  Shortness of breath for 1 day.  EXAM: PORTABLE CHEST - 1 VIEW  COMPARISON:  Single view of the chest 10/15/2014. PA and lateral chest 10/12/2014.  FINDINGS: Lung volumes are low. There is cardiomegaly and mild interstitial edema. Patchy airspace disease in the left mid and lower lung zones does not appear notably change compared to the most recent exam.  IMPRESSION: Cardiomegaly and mild interstitial edema.  No marked change in patchy  left mid and lower lung zone airspace disease which could be due to atelectasis or infection.   Electronically Signed   By: Marcello Moores  Dalessio M.D.   On: 10/17/2014 19:50      Lab Results  Component Value Date   HGBA1C 6.2 09/19/2008   HGBA1C 6.2* 05/31/2008   HGBA1C 6.3* 11/20/2007   Lab Results  Component Value Date   MICROALBUR 0.5 08/11/2006   LDLCALC 96 08/18/2014   CREATININE 1.06* 10/18/2014       Scheduled Meds: . aspirin EC  81 mg Oral Daily  . carvedilol  12.5 mg Oral BID WC  . enoxaparin (LOVENOX) injection  65 mg Subcutaneous Q24H  . furosemide  20 mg Intravenous Once  . levalbuterol  1.25 mg Nebulization TID  . nystatin  5 mL Oral TID AC & HS  . nystatin   Topical BID  . piperacillin-tazobactam (ZOSYN)  IV  3.375 g Intravenous 3 times per day  . senna-docusate  2 tablet Oral QHS  . sodium chloride  3 mL Intravenous Q12H  . triamcinolone  2 spray Nasal Daily   Continuous Infusions:   Principal Problem:   Acute respiratory failure with hypoxia and hypercarbia Active Problems:   Hypothyroidism   Essential hypertension, benign   Chronic venous insufficiency   Chronic diastolic heart failure   Lymphedema of leg   Pulmonary hypertension   Plantar fasciitis of left foot   Renal insufficiency   Bradycardia   Shortness of breath    Time spent: 45 minutes   [facility] Pager [phone]. If 7PM-7AM, please contact night-coverage at [email or web address], password [facility] 10/18/2014, 10:20 AM  LOS: 4 days

## 2014-10-18 NOTE — Progress Notes (Signed)
PT Cancellation Note  Patient Details Name: Kayla [name] MRN: [id number] DOB: [birth year]   Cancelled Treatment:    Reason Eval/Treat Not Completed: Medical issues which prohibited therapy (Nurse to get ABG and possibly MD to make pt comfort care. )Will f/u tomorrow to determine if PT eval indicated.  Thanks.   Irwin Brakeman F 10/18/2014, 10:25 AM [facility] [phone] (pager)

## 2014-10-18 NOTE — Progress Notes (Signed)
SLP Cancellation Note  Patient Details Name: Kayla [name] MRN: [id number] DOB: Jul 14, 1923   Cancelled treatment:       Reason Eval/Treat Not Completed: Medical issues which prohibited therapy. On BiPAP   Jaxan Michel, Katherene Ponto 10/18/2014, 10:05 AM

## 2014-10-18 NOTE — Progress Notes (Signed)
OT Cancellation Note  Patient Details Name: Kayla [name] MRN: [id number] DOB: [birth year]   Cancelled Treatment:    Reason Eval/Treat Not Completed: Medical issues which prohibited therapy. Continues to be agitated and have difficulty tolerating bipap with increasing C02. Will follow.  Malka So 10/18/2014, 9:18 AM

## 2014-10-18 NOTE — Progress Notes (Signed)
Utilization Review Completed.  

## 2014-10-18 NOTE — Progress Notes (Signed)
CRITICAL VALUE ALERT  Critical value received:  PCO2 65.8  Date of notification:  10/17/14  Time of notification:  2115  Critical value read back:Yes.    Nurse who received alert:  Teresita Madura RN  MD notified (1st page):  Raliegh Ip Schorr  Time of first page:  2130  MD notified (2nd page):  Time of second page:  Responding MD:  Lamar Blinks   Time MD responded:  2130 no new orders

## 2014-10-18 NOTE — Progress Notes (Signed)
CRITICAL VALUE ALERT  Critical value received: PH 7.286 pCO2 59.7, pO2 159, Bicarbonate 27.6  Date of notification:  10/18/14  Time of notification:  0910  Critical value read back:Yes.    Nurse who received alert:  Lester Kinsman RN  MD notified (1st page):  Dr. Allyson Sabal  Time of first page:  0930  MD notified (2nd page):  Time of second page:  Responding MD:  Dr. Allyson Sabal  Time MD responded:  1025 .

## 2014-10-19 DIAGNOSIS — J189 Pneumonia, unspecified organism: Secondary | ICD-10-CM

## 2014-10-19 LAB — COMPREHENSIVE METABOLIC PANEL
ALT: 14 U/L (ref 14–54)
AST: 15 U/L (ref 15–41)
Albumin: 2.7 g/dL — ABNORMAL LOW (ref 3.5–5.0)
Alkaline Phosphatase: 48 U/L (ref 38–126)
Anion gap: 8 (ref 5–15)
BUN: 16 mg/dL (ref 6–20)
CO2: 33 mmol/L — ABNORMAL HIGH (ref 22–32)
Calcium: 10.2 mg/dL (ref 8.9–10.3)
Chloride: 102 mmol/L (ref 101–111)
Creatinine, Ser: 0.88 mg/dL (ref 0.44–1.00)
GFR calc Af Amer: >60 mL/min (ref >60)
GFR calc non Af Amer: 57 mL/min — ABNORMAL LOW (ref >60)
Glucose, Bld: 98 mg/dL (ref 65–99)
Potassium: 4 mmol/L (ref 3.5–5.1)
Sodium: 143 mmol/L (ref 135–145)
Total Bilirubin: 1.2 mg/dL (ref 0.3–1.2)
Total Protein: 6.4 g/dL — ABNORMAL LOW (ref 6.5–8.1)

## 2014-10-19 LAB — CBC
HCT: 35.8 % — ABNORMAL LOW (ref 36.0–46.0)
Hemoglobin: 11 g/dL — ABNORMAL LOW (ref 12.0–15.0)
MCH: 28.7 pg (ref 26.0–34.0)
MCHC: 30.7 g/dL (ref 30.0–36.0)
MCV: 93.5 fL (ref 78.0–100.0)
Platelets: 226 10*3/uL (ref 150–400)
RBC: 3.83 MIL/uL — ABNORMAL LOW (ref 3.87–5.11)
RDW: 13.8 % (ref 11.5–15.5)
WBC: 8.4 10*3/uL (ref 4.0–10.5)

## 2014-10-19 LAB — PROCALCITONIN: Procalcitonin: <0.1 ng/mL

## 2014-10-19 MED ORDER — FUROSEMIDE 20 MG PO TABS
20.0000 mg | ORAL_TABLET | Freq: Every day | ORAL | Status: DC
Start: 2014-10-19 — End: 2014-10-20
  Administered 2014-10-19: 20 mg via ORAL
  Filled 2014-10-19 (×2): qty 1

## 2014-10-19 NOTE — Clinical Social Work Note (Signed)
Clinical Social Work Assessment  Patient Details  Name: Kayla [name] MRN: [id number] Date of Birth: 1923/12/12  Date of referral:  10/19/14               Reason for consult:  Facility Placement                Permission sought to share information with:  Facility Sport and exercise psychologist, Family Supports Permission granted to share information::  Yes, Verbal Permission Granted  Name::     Ms. Vetere  Agency::  [facility]  Relationship::  daughter  Contact Information:     Housing/Transportation Living arrangements for the past 2 months:  Single Family Home Source of Information:  Adult Children Patient Interpreter Needed:  None Criminal Activity/Legal Involvement Pertinent to Current Situation/Hospitalization:  No - Comment as needed Significant Relationships:  Adult Children, Spouse Lives with:  Adult Children, Spouse Do you feel safe going back to the place where you live?  Yes Need for family participation in patient care:  Yes (Comment)  Care giving concerns:  Pt lives at home with spouse and dtr- both are unable to give physical assistance- pt has part time caregiver at home   Social Worker assessment / plan:  CSW spoke with pt daughter about potential SNF placement for pt for short term rehab/care  Employment status:  Retired Nurse, adult PT Recommendations:  [place] / Referral to community resources:  [place]  Patient/Family's Response to care:  Pt dtr was beginning to think that the pt would not be manageable at home and is agreeable to looking into SNF options at this time  Patient/Family's Understanding of and Emotional Response to Diagnosis, Current Treatment, and Prognosis:  Pt dtr seemed to be realistic about pt condition and has spoken to hospice as well because of pt current condition  Emotional Assessment Appearance:  Appears stated age Attitude/Demeanor/Rapport:  Unable to  Assess Affect (typically observed):  Unable to Assess Orientation:  Oriented to Self, Oriented to Place Alcohol / Substance use:  Not Applicable Psych involvement (Current and /or in the community):  No (Comment)  Discharge Needs  Concerns to be addressed:    Readmission within the last 30 days:  No Current discharge risk:  Physical Impairment Barriers to Discharge:  Continued Medical Work up   Cranford Mon, LCSW 10/19/2014, 3:16 PM

## 2014-10-19 NOTE — Clinical Social Work Placement (Signed)
   CLINICAL SOCIAL WORK PLACEMENT  NOTE  Date:  10/19/2014  Patient Details  Name: ROSELIND KLUS MRN: [id number] Date of Birth: 1923/10/08  Clinical Social Work is seeking post-discharge placement for this patient at the [place] level of care (*CSW will initial, date and re-position this form in  chart as items are completed):  Yes   Patient/family provided with [facility] Work Department's list of facilities offering this level of care within the geographic area requested by the patient (or if unable, by the patient's family).  Yes   Patient/family informed of their freedom to choose among providers that offer the needed level of care, that participate in Medicare, Medicaid or managed care program needed by the patient, have an available bed and are willing to accept the patient.  Yes   Patient/family informed of [facility]'s ownership interest in [facility] and [facility], as well as of the fact that they are under no obligation to receive care at these facilities.  PASRR submitted to EDS on 10/19/14     PASRR number received on 10/19/14     Existing PASRR number confirmed on       FL2 transmitted to all facilities in geographic area requested by pt/family on 10/19/14     FL2 transmitted to all facilities within larger geographic area on       Patient informed that his/her managed care company has contracts with or will negotiate with certain facilities, including the following:            Patient/family informed of bed offers received.  Patient chooses bed at       Physician recommends and patient chooses bed at      Patient to be transferred to   on  .  Patient to be transferred to facility by       Patient family notified on   of transfer.  Name of family member notified:        PHYSICIAN Please sign FL2, Please sign DNR     Additional Comment:    _______________________________________________ Cranford Mon,  LCSW 10/19/2014, 3:19 PM

## 2014-10-19 NOTE — Progress Notes (Signed)
Patient have a sign in the room requesting her cross be kept in the right hand, however, patient's daughter states she gave the cross to her son Juanda Crumble.

## 2014-10-19 NOTE — Progress Notes (Signed)
[facility] PROGRESS NOTE  Kayla [name] HGD:[id number] DOB: 01/19/1924 DOA: 10/07/2014 PCP: Viviana Simpler, MD Brief narrative 81 year old female with history of hypertension, hypothyroidism, fibromyalgia, morbid obesity, GERD, diastolic dysfunction chronic lymphedema chronic pain syndrome presented with left heel pain off almost 4 weeks duration. She was discharged from the ED with oxycodone and gabapentin as she had difficulty ambulating and was instructed to increase dose of gabapentin as needed. However patient had persistent pain and return to the ED. Patient in the ED was found to be in acute on chronic respiratory failure with hypoxia. His chest x-ray showing mild patchy left lower lobe opacity. Patient admitted to hospitalist service but however became bradycardic and hypotensive overnight. Patient was placed on  BiPAP and transferred to stepdown unit.      Assessment/Plan: Acute on chronic hypoxic and hypercapnic respiratory failure  Possibly a combination of increased narcotic use, diastolic CHF and? Pneumonia Patient now off BiPAP and maintaining O2 sat on 3 L. Continue empiric Zosyn for pneumonia. Recent venous Doppler negative for DVT. -Palliative care consulted by previous hospitalist after discussion with patient's daughter.  ? Left plantar fasciitis Has underlying chronic lymphedema Discontinued long-acting narcotics including fentanyl patch. Avoiding NSAIDs. Use Tylenol when necessary tramadol as needed only. Seen by physical therapy and recommends skilled nursing facility.  Bradycardia Patient placed back on low-dose Coreg and tolerating well. Appreciate cardiology evaluation.   Essential hypertension Patient on multiple blood pressure medications including Tekturna, Coreg, hydralazine, lisinopril and Demadex. All medications were held due to hypotension and bradycardia on admission.   Acute kidney injury Possibly secondary to ?UTI. Now resolved.  Chronic  diastolic CHF When necessary IV Lasix. Continue low-dose Coreg. Held other blood pressure medication.  Anxiety and confusion Mental status is much cleared his morning. Holding off on medications.  Diet: Regular with thin liquids (the relative right speech and swallow this morning)  DVT prophylaxis: Subcutaneous Lovenox   Code Status: DO NOT RESUSCITATE Family Communication: None at bedside  Disposition Plan: Transfer to telemetry if stable on BiPAP today. Awaiting palliative care discussion for goals of care.   Consultants:  Cardiology  Procedures:  None  Antibiotics:  IV Zosyn since 6/12--6/16  HPI/Subjective: Recent seen and examined. She is off BiPAP and maintaining O2 sat on nasal cannula. We'll alert and oriented. Reports pain all over but is unable to specify.  Objective: Filed Vitals:   10/19/14 0820  BP: 159/77  Pulse: 67  Temp:   Resp:     Intake/Output Summary (Last 24 hours) at 10/19/14 0830 Last data filed at 10/18/14 2259  Gross per 24 hour  Intake      6 ml  Output      0 ml  Net      6 ml   Filed Weights   10/17/14 0500 10/18/14 0300 10/19/14 0500  Weight: 134.8 kg (297 lb 2.9 oz) 130.7 kg (288 lb 2.3 oz) 126.5 kg (278 lb 14.1 oz)    Exam:   General:  Elderly obese female in no acute distress  HEENT: No pallor, moist oral mucosa, supple neck, no JVD  Chest: Diffuse breath sounds due to body habitus  Cardiovascular: Normal S1 and S2, no murmurs rub or gallop  GI: Soft, nondistended, nontender, bowel sounds present  Musculoskeletal: Warm, no edema, chronic lymphedema   CNS: Alert and oriented  Data Reviewed: Basic Metabolic Panel:  Recent Labs Lab 10/15/14 0630 10/16/14 0246 10/16/14 1513 10/17/14 0223 10/17/14 1230 10/18/14 0220 10/19/14 0530  NA 138  136  --  140  --  139 143  K 5.0 5.5* 5.0 5.5* 5.1 5.1 4.0  CL 104 104  --  106  --  104 102  CO2 29 26  --  30  --  26 33*  GLUCOSE 108* 100*  --  105*  --  94 98  BUN  27* 30*  --  25*  --  23* 16  CREATININE 1.46* 1.32*  --  1.11*  --  1.06* 0.88  CALCIUM 9.7 9.6  --  9.9  --  10.1 10.2  MG 2.1  --   --   --   --   --   --    Liver Function Tests:  Recent Labs Lab 10/15/14 0630 10/16/14 0246 10/17/14 0223 10/18/14 0220 10/19/14 0530  AST 15 <5* 15 21 15   ALT 14 15 14 14 14   ALKPHOS 41 47 41 48 48  BILITOT 0.5 0.5 0.5 1.0 1.2  PROT 6.2* 6.3* 6.1* 6.4* 6.4*  ALBUMIN 3.1* 3.3* 2.9* 2.9* 2.7*   No results for input(s): LIPASE, AMYLASE in the last 168 hours. No results for input(s): AMMONIA in the last 168 hours. CBC:  Recent Labs Lab 10/15/2014 1958 10/15/14 0630 10/16/14 0246 10/19/14 0530  WBC 7.1 6.8 8.1 8.4  NEUTROABS 5.4 5.0  --   --   HGB 10.3* 10.1* 11.2* 11.0*  HCT 33.8* 33.8* 38.1 35.8*  MCV 93.4 96.0 96.9 93.5  PLT 201 200 248 226   Cardiac Enzymes:  Recent Labs Lab 10/15/14 0630 10/15/14 1125  TROPONINI <0.03 <0.03   BNP (last 3 results)  Recent Labs  10/29/2014 1958 10/17/14 1232 10/18/14 0220  BNP 212.6* 178.9* 217.8*    ProBNP (last 3 results)  Recent Labs  04/13/14 1556  PROBNP 470.9*    CBG: No results for input(s): GLUCAP in the last 168 hours.  Recent Results (from the past 240 hour(s))  MRSA PCR Screening     Status: None   Collection Time: 10/15/14  3:00 AM  Result Value Ref Range Status   MRSA by PCR NEGATIVE NEGATIVE Final    Comment:        The GeneXpert MRSA Assay (FDA approved for NASAL specimens only), is one component of a comprehensive MRSA colonization surveillance program. It is not intended to diagnose MRSA infection nor to guide or monitor treatment for MRSA infections.   Culture, blood (routine x 2)     Status: None (Preliminary result)   Collection Time: 10/15/14 11:25 AM  Result Value Ref Range Status   Specimen Description BLOOD RIGHT ANTECUBITAL  Final   Special Requests BOTTLES DRAWN AEROBIC ONLY 10CC  Final   Culture   Final           BLOOD CULTURE RECEIVED NO  GROWTH TO DATE CULTURE WILL BE HELD FOR 5 DAYS BEFORE ISSUING A FINAL NEGATIVE REPORT Performed at [facility]    Report Status PENDING  Incomplete  Culture, blood (routine x 2)     Status: None (Preliminary result)   Collection Time: 10/15/14 11:33 AM  Result Value Ref Range Status   Specimen Description BLOOD LEFT ANTECUBITAL  Final   Special Requests BOTTLES DRAWN AEROBIC ONLY 5CC  Final   Culture   Final           BLOOD CULTURE RECEIVED NO GROWTH TO DATE CULTURE WILL BE HELD FOR 5 DAYS BEFORE ISSUING A FINAL NEGATIVE REPORT Performed at [facility]  Report Status PENDING  Incomplete  Urine culture     Status: None   Collection Time: 10/17/14  3:06 AM  Result Value Ref Range Status   Specimen Description URINE, RANDOM  Final   Special Requests Normal  Final   Colony Count   Final    25,000 COLONIES/ML Performed at [facility]    Culture   Final    Multiple bacterial morphotypes present, none predominant. Suggest appropriate recollection if clinically indicated. Performed at [facility]    Report Status 10/18/2014 FINAL  Final     Studies: Dg Chest Port 1 View  10/17/2014   CLINICAL DATA:  Shortness of breath for 1 day.  EXAM: PORTABLE CHEST - 1 VIEW  COMPARISON:  Single view of the chest 10/15/2014. PA and lateral chest 10/23/2014.  FINDINGS: Lung volumes are low. There is cardiomegaly and mild interstitial edema. Patchy airspace disease in the left mid and lower lung zones does not appear notably change compared to the most recent exam.  IMPRESSION: Cardiomegaly and mild interstitial edema.  No marked change in patchy left mid and lower lung zone airspace disease which could be due to atelectasis or infection.   Electronically Signed   By: Inge Rise M.D.   On: 10/17/2014 19:50    Scheduled Meds: . aspirin EC  81 mg Oral Daily  . carvedilol  12.5 mg Oral BID WC  . enoxaparin (LOVENOX) injection  65 mg Subcutaneous Q24H  .  levalbuterol  1.25 mg Nebulization TID  . nystatin  5 mL Oral TID AC & HS  . nystatin   Topical BID  . piperacillin-tazobactam (ZOSYN)  IV  3.375 g Intravenous 3 times per day  . senna-docusate  2 tablet Oral QHS  . sodium chloride  3 mL Intravenous Q12H  . triamcinolone  2 spray Nasal Daily   Continuous Infusions:     Time spent: 25 minutes    Zinnia Tindall, [place]  [facility] Pager [phone] If 7PM-7AM, please contact night-coverage at [email or web address], password [facility] 10/19/2014, 8:30 AM  LOS: 5 days

## 2014-10-19 NOTE — Evaluation (Signed)
Clinical/Bedside Swallow Evaluation Patient Details  Name: Kayla [name] MRN: [id number] Date of Birth: 08-17-1923  Today's Date: 10/19/2014 Time: SLP Start Time (ACUTE ONLY): 2130 SLP Stop Time (ACUTE ONLY): 0845 SLP Time Calculation (min) (ACUTE ONLY): 13 min  Past Medical History:  Past Medical History  Diagnosis Date  . HTN (hypertension)     difficult to control  . Hypothyroidism   . OP (osteoporosis)   . Fibromyalgia   . Edema of both legs     chronic  . Degenerative disk disease     cervical  . Follicular lymphoma     Quiescent. Dr. Fransisco Beau; Dr. Kathyrn Sheriff  . Meningioma     Left frontal  . GERD (gastroesophageal reflux disease)   . Obesity   . Cerebral hemorrhage 2004    hypertensive  . Hx: UTI (urinary tract infection)   . Renal artery aneurysm     with stenosis-right; followed by Dr. Delana Meyer  . Diastolic CHF, chronic     Echo (9-11) with EF 65-70%, mild LVH, mid AI, Mild MR, PA systolic pressure > 86VHQI   Past Surgical History:  Past Surgical History  Procedure Laterality Date  . Cataract extraction  10/05 and 4/07    Left then Right [facility])  . Abdominal hysterectomy  1958  . Cerebral hemorrhage  10/2002  . Cervical fusion  1985  . Rectocele repair  1982  . Cystocele repair  1982   HPI:  81 y.o. female with past medical history of hypertension, hypothyroidism, fibromyalgia, morbid obesity, cerbral hemorrhage, GERD, chronic diastolic dysfunction, chronic lymphedema, chronic pain syndrome, cervical fusion 1985, left frontal meningioma admitted with complaints of left heel pain. Pt developed acute respiratory failure with hypoxia and hypercarbia, acute kidney injury and possible plantar faciitis and has required Bipap for several days. CXR 6/10 mild patchy left lower lobe opacity, atelectasis versus pneumonia and repeat CXR 6/13 Cardiomegaly and mild interstitial edema.No marked change in patchy left mid and lower lung zone airspace disease which could be due to  atelectasis or infection.   Assessment / Plan / Recommendation Clinical Impression  Pt is at risk for aspiration from a respiratory standpoint and appears mild-moderate s/p this swallow assessment. Increased work of breathing (mild) with po's with appropriated exhalation immediately following swallow. Pt educated on importance of slow rate, small sips especially with straw and rest breaks due to respiratory status. Recommend regular texture diet and thin liquids, pills with water and follow up for saftey.    Aspiration Risk   (mild-mod)    Diet Recommendation Thin (regular)   Medication Administration: Whole meds with liquid Compensations: Slow rate;Small sips/bites    Other  Recommendations Oral Care Recommendations: Oral care BID   Follow Up Recommendations       Frequency and Duration min 1 x/week  2 weeks   Pertinent Vitals/Pain none         Swallow Study       Oral/Motor/Sensory Function Overall Oral Motor/Sensory Function: Appears within functional limits for tasks assessed   Ice Chips Ice chips: Not tested   Thin Liquid Thin Liquid: Impaired Presentation: Cup;Straw Pharyngeal  Phase Impairments:  (increased respirations)    Nectar Thick Nectar Thick Liquid: Not tested   Honey Thick Honey Thick Liquid: Not tested   Puree Puree: Within functional limits   Solid   GO    Solid: Impaired Pharyngeal Phase Impairments: Change in Vital Signs (increased work of breathing mild)       Mick Sell, Orbie Pyo  10/19/2014,9:04 AM  Orbie Pyo Colvin Caroli.Ed [employer] [phone]

## 2014-10-19 NOTE — Progress Notes (Signed)
Patient noted more alert and cooperative through the night. Restraints removed with incident. BiPAP in place. ABG remain  Abnormal. x1 PRN oxy 5mg  administered for c/o pain, with + effects reported.

## 2014-10-19 NOTE — Progress Notes (Signed)
ANTIBIOTIC CONSULT NOTE - FOLLOW UP  Pharmacy Consult for Zosyn Indication: pneumonia and UTI  Allergies  Allergen Reactions  . Amoxicillin-Pot Clavulanate     unknown  . Augmentin [Amoxicillin-Pot Clavulanate]   . Cefuroxime Axetil     unknown  . Cephalexin     Felt uncomfortable  . Levaquin [Levofloxacin In D5w]     unknown  . Sulfonamide Derivatives     unknown    Patient Measurements: Height: 5\' 2"  (157.5 cm) Weight: 278 lb 14.1 oz (126.5 kg) IBW/kg (Calculated) : 50.1  Vital Signs: Temp: 98.1 F (36.7 C) (06/15 0738) Temp Source: Oral (06/15 0738) BP: 159/77 mmHg (06/15 0820) Pulse Rate: 67 (06/15 0820) Intake/Output from previous day: 06/14 0701 - 06/15 0700 In: 6 [I.V.:6] Out: -  Intake/Output from this shift: Total I/O In: 123 [P.O.:120; I.V.:3] Out: -   Labs:  Recent Labs  10/17/14 0223 10/18/14 0220 10/19/14 0530  WBC  --   --  8.4  HGB  --   --  11.0*  PLT  --   --  226  CREATININE 1.11* 1.06* 0.88   Estimated Creatinine Clearance: 55.2 mL/min (by C-G formula based on Cr of 0.88).  Assessment: 89yof continues on day #4 for possible pneumonia and UTI. Renal function is stable. Zosyn dose appropriate.  Zosyn 6/12>>  6/11 blood cx>>ngtd 6/12 UA + bacteria, large leukocytes, positive nitrite 6/13 urine cx>> 25k colonies, final  Goal of Therapy:  Appropriate dosing  Plan:  1) Continue zosyn 3.375g IV q8 (4 hour infusion)  Deboraha Sprang 10/19/2014,10:56 AM

## 2014-10-19 NOTE — Progress Notes (Signed)
Report called to receiving nurse. Patient's family notified of transfer to [id number].

## 2014-10-19 NOTE — Progress Notes (Addendum)
Subjective:  More sedate and relaxed today  Objective:   Vital Signs : Filed Vitals:   10/19/14 0600 10/19/14 0738 10/19/14 0747 10/19/14 0820  BP: 121/97 142/78  159/77  Pulse: 77 85  67  Temp:  98.1 F (36.7 C)    TempSrc:  Oral    Resp: 20 17    Height:      Weight:      SpO2:  99% 99%     Intake/Output from previous day:  Intake/Output Summary (Last 24 hours) at 10/19/14 1042 Last data filed at 10/19/14 0907  Gross per 24 hour  Intake    126 ml  Output      0 ml  Net    126 ml   I/O since admission: +745  Wt Readings from Last 3 Encounters:  10/19/14 126.5 kg (278 lb 14.1 oz)  08/18/14 129.729 kg (286 lb)  06/15/14 131.09 kg (289 lb)    Medications: . aspirin EC  81 mg Oral Daily  . carvedilol  12.5 mg Oral BID WC  . enoxaparin (LOVENOX) injection  65 mg Subcutaneous Q24H  . levalbuterol  1.25 mg Nebulization TID  . nystatin  5 mL Oral TID AC & HS  . nystatin   Topical BID  . piperacillin-tazobactam (ZOSYN)  IV  3.375 g Intravenous 3 times per day  . senna-docusate  2 tablet Oral QHS  . sodium chloride  3 mL Intravenous Q12H  . triamcinolone  2 spray Nasal Daily       Physical Exam:   General appearance: No longer combative;  on BiPAP now Neck: no carotid bruit, no JVD and thyroid not enlarged, symmetric, no tenderness/mass/nodules Lungs: no wheezing Heart: regular rate and rhythm Abdomen: central adiposity; soft, non-tender; bowel sounds normal; no masses,  no organomegaly Extremities: large legs,  Neurologic: Grossly normal   Rate: 66  Rhythm: normal sinus rhythm  Last ECG done on 10/15/14  (independently read by me): SB at 51 with 1st degree AV block  Lab Results:   Recent Labs  10/17/14 0223 10/17/14 1230 10/18/14 0220 10/19/14 0530  NA 140  --  139 143  K 5.5* 5.1 5.1 4.0  CL 106  --  104 102  CO2 30  --  26 33*  GLUCOSE 105*  --  94 98  BUN 25*  --  23* 16  CREATININE 1.11*  --  1.06* 0.88  CALCIUM 9.9  --  10.1 10.2      Recent Labs  10/19/14 0530  WBC 8.4  HGB 11.0*  HCT 35.8*  MCV 93.5  PLT 226    No results for input(s): TROPONINI in the last 72 hours.  Invalid input(s): CK, MB  Lab Results  Component Value Date   TSH 4.026 10/15/2014     Recent Labs  10/17/14 0223 10/18/14 0220 10/19/14 0530  PROT 6.1* 6.4* 6.4*  ALBUMIN 2.9* 2.9* 2.7*  AST 15 21 15   ALT 14 14 14   ALKPHOS 41 48 48  BILITOT 0.5 1.0 1.2   No results for input(s): INR in the last 72 hours. BNP (last 3 results)  Recent Labs  10/10/2014 1958 10/17/14 1232 10/18/14 0220  BNP 212.6* 178.9* 217.8*    ProBNP (last 3 results)  Recent Labs  04/13/14 1556  PROBNP 470.9*     Lipid Panel     Component Value Date/Time   CHOL 161 08/18/2014 1447   TRIG 95.0 08/18/2014 1447   HDL 46.40 08/18/2014 1447  CHOLHDL 3 08/18/2014 1447   VLDL 19.0 08/18/2014 1447   LDLCALC 96 08/18/2014 1447   LDLDIRECT 134.3 08/11/2006 1213    ABG this am: 7.28/59/7/159/27.6 on BiPAP 16/9   No results for input(s): HGBA1C in the last 72 hours.   Imaging:  Dg Chest Port 1 View  10/17/2014   CLINICAL DATA:  Shortness of breath for 1 day.  EXAM: PORTABLE CHEST - 1 VIEW  COMPARISON:  Single view of the chest 10/15/2014. PA and lateral chest 10/05/2014.  FINDINGS: Lung volumes are low. There is cardiomegaly and mild interstitial edema. Patchy airspace disease in the left mid and lower lung zones does not appear notably change compared to the most recent exam.  IMPRESSION: Cardiomegaly and mild interstitial edema.  No marked change in patchy left mid and lower lung zone airspace disease which could be due to atelectasis or infection.   Electronically Signed   By: Inge Rise M.D.   On: 10/17/2014 19:50      Assessment/Plan:   81 year old female with a history of HTN, morbid obesity (BMI 52), chronic diastolic heart failure, fibromyalgia, chronic pain syndrome, and chronic lower extremity lymphedema with leg swelling  bilaterally for > 10 years. She was admitted 10/18/2014 with chronic left foot pain, cellulitis and question sepsis. Her B/P was running in the low 100 range and heart rates in the 50s. Hydralazine, Tekturna, lisinopril, and Coreg were held. Also noted to have hypoxia and hypercarbia likely associated with OHS and increasing use of narcotics. Being treated for plantar fasciitis. Lactic acid and troponins negative. Mild acute kidney injury which is improving.    Principal Problem:   Acute respiratory failure with hypoxia and hypercarbia Active Problems:   Hypothyroidism   Essential hypertension, benign   Chronic venous insufficiency   Chronic diastolic heart failure   Lymphedema of leg   Pulmonary hypertension   Plantar fasciitis of left foot   Renal insufficiency   Bradycardia   Shortness of breath   1. Acute respiratory failure with hypercarbia and hypoxia and agitation, UTI - on abx per IM as CXR cannot exclude pulm infection. She also has a UTI. Narcotics are being limited as this likely contributed on admission - there is likely a component of narcotic withdrawal. Improved. 2. Hypotension/sinus bradycardia - improved.  Tolerating carvedilol.  3. Chronic diastolic CHF -  I/O +400 since admission.  BNP yesterday 217.  EF 55 - 60% with grade 1 diastolic dysfunction on last echo 06/2014.  Would start oral lasix 20 mg daily.  4. Hyperkalemia - resolved, 4.0 today; Cr 0.8  5. Anxiety/confusion: improved  6. Lymphedema  Troy Sine, MD, [facility] 10/19/2014, 10:42 AM

## 2014-10-20 DIAGNOSIS — I959 Hypotension, unspecified: Secondary | ICD-10-CM

## 2014-10-20 DIAGNOSIS — J9601 Acute respiratory failure with hypoxia: Secondary | ICD-10-CM

## 2014-10-20 DIAGNOSIS — E875 Hyperkalemia: Secondary | ICD-10-CM

## 2014-10-20 DIAGNOSIS — I5032 Chronic diastolic (congestive) heart failure: Secondary | ICD-10-CM

## 2014-10-20 LAB — BASIC METABOLIC PANEL
ANION GAP: 7 (ref 5–15)
BUN: 16 mg/dL (ref 6–20)
CHLORIDE: 100 mmol/L — AB (ref 101–111)
CO2: 36 mmol/L — ABNORMAL HIGH (ref 22–32)
CREATININE: 0.83 mg/dL (ref 0.44–1.00)
Calcium: 10.2 mg/dL (ref 8.9–10.3)
GFR calc Af Amer: >60 mL/min (ref >60)
GFR calc non Af Amer: >60 mL/min (ref >60)
Glucose, Bld: 97 mg/dL (ref 65–99)
Potassium: 4 mmol/L (ref 3.5–5.1)
Sodium: 143 mmol/L (ref 135–145)

## 2014-10-20 MED ORDER — LEVALBUTEROL HCL 1.25 MG/0.5ML IN NEBU
1.2500 mg | INHALATION_SOLUTION | Freq: Four times a day (QID) | RESPIRATORY_TRACT | Status: DC | PRN
  Administered 2014-10-21: 1.25 mg via RESPIRATORY_TRACT
  Filled 2014-10-20 (×2): qty 0.5

## 2014-10-20 MED ORDER — CARVEDILOL 12.5 MG PO TABS
12.5000 mg | ORAL_TABLET | Freq: Two times a day (BID) | ORAL | Status: DC
Start: 2014-10-20 — End: 2014-10-24
  Administered 2014-10-20 – 2014-10-23 (×7): 12.5 mg via ORAL
  Filled 2014-10-20 (×12): qty 1

## 2014-10-20 MED ORDER — TORSEMIDE 20 MG PO TABS
20.0000 mg | ORAL_TABLET | Freq: Every day | ORAL | Status: DC
Start: 2014-10-20 — End: 2014-10-23
  Administered 2014-10-20 – 2014-10-23 (×4): 20 mg via ORAL
  Filled 2014-10-20 (×4): qty 1

## 2014-10-20 NOTE — Progress Notes (Signed)
Speech Language Pathology Treatment: Dysphagia  Patient Details Name: Kayla [name] MRN: [id number] DOB: 10-Mar-1924 Today's Date: 10/20/2014 Time: 0094-1791 SLP Time Calculation (min) (ACUTE ONLY): 9 min  Assessment / Plan / Recommendation Clinical Impression  Pt tolerating liquids well, no signs of aspiration, demonstrates and verbalizes basic aspiration precautions. Pt independently paces intake carefully. Also observed with taking pills with RN, no difficulty. She does verbalize that she would like softer foods; she was unable to eat the bacon on her breakfast tray. Will change pt to a dys 3 (mechcanical soft) diet with thin liquids. No f/u needed.    HPI Other Pertinent Information: 81 y.o. female with past medical history of hypertension, hypothyroidism, fibromyalgia, morbid obesity, cerbral hemorrhage, GERD, chronic diastolic dysfunction, chronic lymphedema, chronic pain syndrome, cervical fusion 1985, left frontal meningioma admitted with complaints of left heel pain. Pt developed acute respiratory failure with hypoxia and hypercarbia, acute kidney injury and possible plantar faciitis and has required Bipap for several days. CXR 6/10 mild patchy left lower lobe opacity, atelectasis versus pneumonia and repeat CXR 6/13 Cardiomegaly and mild interstitial edema.No marked change in patchy left mid and lower lung zone airspace disease which could be due to atelectasis or infection.   Pertinent Vitals Pain Assessment: No/denies pain  SLP Plan  Discharge SLP treatment due to (comment);All goals met    Recommendations Diet recommendations: Dysphagia 3 (mechanical soft);Thin liquid Liquids provided via: Cup;Straw Medication Administration: Whole meds with liquid Supervision: Patient able to self feed Compensations: Slow rate;Small sips/bites              Plan: Discharge SLP treatment due to (comment);All goals met    GO    Herbie Baltimore, MA CCC-SLP [phone]  Lynann Beaver 10/20/2014, 11:35 AM

## 2014-10-20 NOTE — Progress Notes (Signed)
I have contacted Ms. Casalino's daughter to meet with patient and family and they are tentative for tomorrow 6/17 midmorning. They will call to confirm a time. Thank you for this consult.  Vinie Sill, NP Palliative Medicine Team Pager # [phone] (M-F 8a-5p) Team Phone # [phone] (Nights/Weekends)

## 2014-10-20 NOTE — Progress Notes (Addendum)
[facility] PROGRESS NOTE  Kayla [name] MBW:[id number] DOB: July 30, 1923 DOA: 10/05/2014 PCP: Viviana Simpler, MD Brief narrative 81 year old female with history of hypertension, hypothyroidism, fibromyalgia, morbid obesity, GERD, diastolic dysfunction chronic lymphedema chronic pain syndrome presented with left heel pain off almost 4 weeks duration. She was discharged from the ED with oxycodone and gabapentin as she had difficulty ambulating and was instructed to increase dose of gabapentin as needed. However patient had persistent pain and return to the ED. Patient in the ED was found to be in acute on chronic respiratory failure with hypoxia. His chest x-ray showing mild patchy left lower lobe opacity. Patient admitted to hospitalist service but however became bradycardic and hypotensive overnight. Patient was placed on BiPAP and transferred to stepdown unit.  Assessment/Plan: Acute on chronic hypoxic and hypercapnic respiratory failure  Possibly a combination of increased narcotic use, diastolic CHF and? Pneumonia -now off BiPAP and maintaining O2 sat on 3 L. Completes empiric Zosyn for pneumonia today. . Recent venous Doppler negative for DVT. -Palliative care consulted by previous hospitalist after discussion with patient's daughter.  ? Left plantar fasciitis Has underlying chronic lymphedema Discontinued long-acting narcotics including fentanyl patch. Avoiding NSAIDs. Use Tylenol when necessary tramadol as needed only. Seen by physical therapy and recommends skilled nursing facility. Will follow up with PT again.  Bradycardia Patient placed back on low-dose Coreg and tolerating well. Appreciate cardiology evaluation.   Essential hypertension Patient on multiple blood pressure medications including Tekturna, Coreg, hydralazine, lisinopril and Demadex. All medications were held due to hypotension and bradycardia on admission.   Acute kidney injury Possibly secondary to ?UTI. Now  resolved.holding ACEi/ tekturna due to AKI and hyperkalemia.  Chronic diastolic CHF When necessary IV Lasix. Continue low-dose Coreg. Held other blood pressure medications.  Anxiety and confusion Sleepy with some confusion this am.  Holding off on medications.  Diet: Regular with thin liquids ( seen by swallow nurse)  DVT prophylaxis: Subcutaneous Lovenox   Code Status: DO NOT RESUSCITATE Family Communication: daughter at bedside  Disposition Plan: SNF possibly on 6/18   Consultants:  Cardiology  Procedures:  None  Antibiotics:  IV Zosyn since 6/12--6/16  HPI/Subjective: Recent seen and examined. Stable on [place] (2-3 L). Sleepy this morning with some signs fo resp failure Objective: Filed Vitals:   10/20/14 1128  BP: 167/95  Pulse: 70  Temp:   Resp:     Intake/Output Summary (Last 24 hours) at 10/20/14 1134 Last data filed at 10/20/14 1101  Gross per 24 hour  Intake    600 ml  Output      0 ml  Net    600 ml   Filed Weights   10/19/14 0500 10/19/14 1730 10/20/14 0513  Weight: 126.5 kg (278 lb 14.1 oz) 124.9 kg (275 lb 5.7 oz) 122.7 kg (270 lb 8.1 oz)    Exam:   General: Elderly obese female, appears sleepy  HEENT:  moist oral mucosa, supple neck, no JVD  Chest: diminished breath sounds due to body habitus  Cardiovascular: Normal S1 and S2, no murmurs rub or gallop  GI: Soft, nondistended, nontender, bowel sounds present  Musculoskeletal: Warm, no edema, chronic lymphedema   CNS: sleepy but arousable and mildly confused.  Data Reviewed: Basic Metabolic Panel:  Recent Labs Lab 10/15/14 0630 10/16/14 0246  10/17/14 0223 10/17/14 1230 10/18/14 0220 10/19/14 0530 10/20/14 0545  NA 138 136  --  140  --  139 143 143  K 5.0 5.5*  < > 5.5* 5.1 5.1  4.0 4.0  CL 104 104  --  106  --  104 102 100*  CO2 29 26  --  30  --  26 33* 36*  GLUCOSE 108* 100*  --  105*  --  94 98 97  BUN 27* 30*  --  25*  --  23* 16 16  CREATININE 1.46* 1.32*  --   1.11*  --  1.06* 0.88 0.83  CALCIUM 9.7 9.6  --  9.9  --  10.1 10.2 10.2  MG 2.1  --   --   --   --   --   --   --   < > = values in this interval not displayed. Liver Function Tests:  Recent Labs Lab 10/15/14 0630 10/16/14 0246 10/17/14 0223 10/18/14 0220 10/19/14 0530  AST 15 <5* 15 21 15   ALT 14 15 14 14 14   ALKPHOS 41 47 41 48 48  BILITOT 0.5 0.5 0.5 1.0 1.2  PROT 6.2* 6.3* 6.1* 6.4* 6.4*  ALBUMIN 3.1* 3.3* 2.9* 2.9* 2.7*   No results for input(s): LIPASE, AMYLASE in the last 168 hours. No results for input(s): AMMONIA in the last 168 hours. CBC:  Recent Labs Lab 11/03/2014 1958 10/15/14 0630 10/16/14 0246 10/19/14 0530  WBC 7.1 6.8 8.1 8.4  NEUTROABS 5.4 5.0  --   --   HGB 10.3* 10.1* 11.2* 11.0*  HCT 33.8* 33.8* 38.1 35.8*  MCV 93.4 96.0 96.9 93.5  PLT 201 200 248 226   Cardiac Enzymes:  Recent Labs Lab 10/15/14 0630 10/15/14 1125  TROPONINI <0.03 <0.03   BNP (last 3 results)  Recent Labs  11/03/2014 1958 10/17/14 1232 10/18/14 0220  BNP 212.6* 178.9* 217.8*    ProBNP (last 3 results)  Recent Labs  04/13/14 1556  PROBNP 470.9*    CBG: No results for input(s): GLUCAP in the last 168 hours.  Recent Results (from the past 240 hour(s))  MRSA PCR Screening     Status: None   Collection Time: 10/15/14  3:00 AM  Result Value Ref Range Status   MRSA by PCR NEGATIVE NEGATIVE Final    Comment:        The GeneXpert MRSA Assay (FDA approved for NASAL specimens only), is one component of a comprehensive MRSA colonization surveillance program. It is not intended to diagnose MRSA infection nor to guide or monitor treatment for MRSA infections.   Culture, blood (routine x 2)     Status: None (Preliminary result)   Collection Time: 10/15/14 11:25 AM  Result Value Ref Range Status   Specimen Description BLOOD RIGHT ANTECUBITAL  Final   Special Requests BOTTLES DRAWN AEROBIC ONLY 10CC  Final   Culture   Final           BLOOD CULTURE RECEIVED NO  GROWTH TO DATE CULTURE WILL BE HELD FOR 5 DAYS BEFORE ISSUING A FINAL NEGATIVE REPORT Performed at [facility]    Report Status PENDING  Incomplete  Culture, blood (routine x 2)     Status: None (Preliminary result)   Collection Time: 10/15/14 11:33 AM  Result Value Ref Range Status   Specimen Description BLOOD LEFT ANTECUBITAL  Final   Special Requests BOTTLES DRAWN AEROBIC ONLY 5CC  Final   Culture   Final           BLOOD CULTURE RECEIVED NO GROWTH TO DATE CULTURE WILL BE HELD FOR 5 DAYS BEFORE ISSUING A FINAL NEGATIVE REPORT Performed at [facility]  Report Status PENDING  Incomplete  Urine culture     Status: None   Collection Time: 10/17/14  3:06 AM  Result Value Ref Range Status   Specimen Description URINE, RANDOM  Final   Special Requests Normal  Final   Colony Count   Final    25,000 COLONIES/ML Performed at [facility]    Culture   Final    Multiple bacterial morphotypes present, none predominant. Suggest appropriate recollection if clinically indicated. Performed at [facility]    Report Status 10/18/2014 FINAL  Final     Studies: No results found.  Scheduled Meds: . aspirin EC  81 mg Oral Daily  . carvedilol  12.5 mg Oral BID WC  . enoxaparin (LOVENOX) injection  65 mg Subcutaneous Q24H  . levalbuterol  1.25 mg Nebulization TID  . nystatin  5 mL Oral TID AC & HS  . nystatin   Topical BID  . piperacillin-tazobactam (ZOSYN)  IV  3.375 g Intravenous 3 times per day  . senna-docusate  2 tablet Oral QHS  . sodium chloride  3 mL Intravenous Q12H  . torsemide  20 mg Oral Daily  . triamcinolone  2 spray Nasal Daily   Continuous Infusions:     Time spent: 25 minutes    Yahayra Geis, [place]  [facility] Pager [phone]. If 7PM-7AM, please contact night-coverage at [email or web address], password [facility] 10/20/2014, 11:34 AM  LOS: 6 days

## 2014-10-20 NOTE — Progress Notes (Signed)
UR COMPLETED  

## 2014-10-20 NOTE — Progress Notes (Signed)
Patient discussed earlier today with Dr. Clementeen Graham. Stated that he anticipates stability for d/c on Saturday to SNF. Patient and daughter [name] notified of above. Spoke with Gerald Stabs, Admissions at [facility]. Bed will be available on Saturday for patient as this is facility of choice per daughter.  Weekend CSW should contact Gerald Stabs at [facility] at d/c [phone] and fax d/c summary to [phone].  Lorie Phenix. Pauline Good, [place]

## 2014-10-20 NOTE — Plan of Care (Signed)
Problem: Phase I Progression Outcomes Goal: EF % per last Echo/documented,Core Reminder form on chart Outcome: Completed/Met Date Met:  10/20/14 EF = 55-60% per ECHO performed on 06/23/14.

## 2014-10-20 NOTE — Progress Notes (Signed)
Patient: Kayla [name] / Admit Date: 10/28/2014 / Date of Encounter: 10/20/2014, 9:29 AM   Subjective: Continues to be relaxed, calm. Denies SOB or CP.   Objective: Telemetry: NSR/SB (50s-60s) Physical Exam: Blood pressure 163/70, pulse 77, temperature 98.7 F (37.1 C), temperature source Oral, resp. rate 20, height 5\' 2"  (1.575 m), weight 270 lb 8.1 oz (122.7 kg), SpO2 98 %. General: Well developed obese WF in no acute distress. Head: Normocephalic, atraumatic, sclera non-icteric, no xanthomas, nares are without discharge. Neck: JVP not elevated. Lungs: Coarse BS but no wheezes, rales, or rhonchi. Breathing is unlabored. Heart: RRR S1 S2 without murmurs, rubs, or gallops.  Abdomen: Soft, non-tender, non-distended with normoactive bowel sounds. No rebound/guarding. Extremities: No clubbing or cyanosis. Bilateral chronic appearing lymphedema changes. Neuro: Alert and oriented X 3.   Intake/Output Summary (Last 24 hours) at 10/20/14 0929 Last data filed at 10/20/14 0843  Gross per 24 hour  Intake    240 ml  Output      0 ml  Net    240 ml    Inpatient Medications:  . aspirin EC  81 mg Oral Daily  . carvedilol  12.5 mg Oral BID WC  . enoxaparin (LOVENOX) injection  65 mg Subcutaneous Q24H  . furosemide  20 mg Oral Daily  . levalbuterol  1.25 mg Nebulization TID  . nystatin  5 mL Oral TID AC & HS  . nystatin   Topical BID  . piperacillin-tazobactam (ZOSYN)  IV  3.375 g Intravenous 3 times per day  . senna-docusate  2 tablet Oral QHS  . sodium chloride  3 mL Intravenous Q12H  . triamcinolone  2 spray Nasal Daily   Infusions:    Labs:  Recent Labs  10/19/14 0530 10/20/14 0545  NA 143 143  K 4.0 4.0  CL 102 100*  CO2 33* 36*  GLUCOSE 98 97  BUN 16 16  CREATININE 0.88 0.83  CALCIUM 10.2 10.2    Recent Labs  10/18/14 0220 10/19/14 0530  AST 21 15  ALT 14 14  ALKPHOS 48 48  BILITOT 1.0 1.2  PROT 6.4* 6.4*  ALBUMIN 2.9* 2.7*    Recent Labs  10/19/14 0530   WBC 8.4  HGB 11.0*  HCT 35.8*  MCV 93.5  PLT 226   No results for input(s): CKTOTAL, CKMB, TROPONINI in the last 72 hours. Invalid input(s): POCBNP No results for input(s): HGBA1C in the last 72 hours.   Radiology/Studies:  Dg Chest 2 View  10/13/2014   CLINICAL DATA:  Bilateral lower extremity pain and swelling  EXAM: CHEST  2 VIEW  COMPARISON:  04/13/2014  FINDINGS: Mild right perihilar scarring, chronic. Mild patchy left lower lobe opacity, atelectasis versus pneumonia. No frank interstitial edema. No pleural effusion or pneumothorax.  Cardiomegaly.  Degenerative changes of the visualized thoracolumbar spine. Stable moderate compression fracture deformity at T10.  IMPRESSION: Mild patchy left lower lobe opacity, atelectasis versus pneumonia.  No frank interstitial edema.   Electronically Signed   By: Julian Hy M.D.   On: 10/12/2014 20:36   Dg Pelvis 1-2 Views  10/11/2014   CLINICAL DATA:  Bilateral lower extremity pain  EXAM: PELVIS - 1-2 VIEW  COMPARISON:  None.  FINDINGS: No fracture or dislocation is seen.  Bilateral hip joint spaces are mildly narrowed but symmetric.  Visualized bony pelvis appears intact.  IMPRESSION: Negative.   Electronically Signed   By: Julian Hy M.D.   On: 10/19/2014 20:34   Dg Chest [facility]  1 View  10/17/2014   CLINICAL DATA:  Shortness of breath for 1 day.  EXAM: PORTABLE CHEST - 1 VIEW  COMPARISON:  Single view of the chest 10/15/2014. PA and lateral chest 10/23/2014.  FINDINGS: Lung volumes are low. There is cardiomegaly and mild interstitial edema. Patchy airspace disease in the left mid and lower lung zones does not appear notably change compared to the most recent exam.  IMPRESSION: Cardiomegaly and mild interstitial edema.  No marked change in patchy left mid and lower lung zone airspace disease which could be due to atelectasis or infection.   Electronically Signed   By: Inge Rise M.D.   On: 10/17/2014 19:50   Dg Chest Port 1  View  10/15/2014   CLINICAL DATA:  Shortness of breath.  EXAM: PORTABLE CHEST - 1 VIEW  COMPARISON:  10/10/2014  FINDINGS: Cardiac silhouette remains mildly enlarged. Thoracic aortic calcification is noted. Lungs are mildly hypoinflated with mild elevation of the right hemidiaphragm. Mild chronic coarsening of the interstitial markings is similar to the prior study. There is persistent patchy retrocardiac left lower lobe opacity which is similar to the prior study. More laterally in the left midlung is new opacity which is largely linear in configuration. No overt pulmonary edema, sizable pleural effusion, or pneumothorax is identified. No acute osseous abnormality is seen.  IMPRESSION: Patchy left basilar opacities, mildly increased from prior and favored to represent atelectasis although infection is not excluded.   Electronically Signed   By: Logan Bores   On: 10/15/2014 10:35   Dg Foot Complete Left  10/07/2014   CLINICAL DATA:  Left foot pain.  Lymph edema of the leg.  EXAM: LEFT FOOT - COMPLETE 3+ VIEW  COMPARISON:  None.  FINDINGS: Moderate osteopenia is present. There is fusion across the first, second, third, and fourth TMT joints. Inter [place] she will joints are fused in the third and fourth digits. No acute fracture is present. Extensive soft tissue swelling is likely related to lymphedema. Vascular calcifications are noted as well.  IMPRESSION: 1. Extensive soft tissue swelling is likely related to lymphedema. No discrete mass is present. 2. Atherosclerotic changes typical of diabetes. 3. Marked osteopenia without acute abnormality of the bones.   Electronically Signed   By: San Morelle M.D.   On: 10/07/2014 13:07     Assessment and Plan  81 year old female with a history of HTN, morbid obesity (BMI 52), chronic diastolic heart failure, fibromyalgia, chronic pain syndrome, and chronic lower extremity lymphedema with leg swelling bilaterally for > 10 years. She was admitted 11/03/2014 with  chronic left foot pain, cellulitis and question sepsis. Her B/P was running in the low 100 range and heart rates in the 50s. Hydralazine, Tekturna, lisinopril, and Coreg were held. Also noted to have hypoxia and hypercarbia likely associated with OHS and increasing use of narcotics. Being treated for plantar fasciitis. Lactic acid and troponins negative. Mild acute kidney injury which is improving.   1. Acute respiratory failure with hypercarbia and hypoxia and agitation, UTI - on abx per IM as CXR cannot exclude pulm infection. She also has a UTI. Narcotics are being limited as this likely contributed on admission - there was likely a component of narcotic withdrawal. Treated temporarily with bipap with improvement, now on [place]. Agree with palliative care consult as cited in IM note yesterday.  2. Hypotension/sinus bradycardia - improved. Tolerating Coreg. HR in the 50s-60s. Will change Lasix to home torsemide since she was stable on this as an  outpatient. If need be, can add back home hydralazine today if BPs remain elevated. Norvasc is less ideal to add given her chronic LEE .Would not resume her home ACEI/Tekturna right now due to recent AKI and hyperkalemia.  3. Chronic diastolic CHF -  will switch her Lasix to home torsemide since she did well on this as an outpatient.  4. Hyperkalemia with AKI - offending agents are now on hold - would not resume ACEI/Tekturna for now.  Cardiology may be able to s/o - will confer with Dr. Claiborne Billings.  Signed, Melina Copa PA-C Pager: [phone]   Patient seen and examined. Agree with assessment and plan. I/O mildly positive. Re-initiate torsemide home dose. K 4.0 Will sign off. Call if assistance needed.   Troy Sine, MD, [facility] 10/20/2014 12:46 PM

## 2014-10-20 NOTE — Clinical Social Work Placement (Signed)
   CLINICAL SOCIAL WORK PLACEMENT  NOTE  Date:  10/20/2014  Patient Details  Name: Kayla [name] MRN: [id number] Date of Birth: [birth year]  Clinical Social Work is seeking post-discharge placement for this patient at the [place] level of care (*CSW will initial, date and re-position this form in  chart as items are completed):  Yes   Patient/family provided with [place] Work Department's list of facilities offering this level of care within the geographic area requested by the patient (or if unable, by the patient's family).  Yes   Patient/family informed of their freedom to choose among providers that offer the needed level of care, that participate in Medicare, Medicaid or managed care program needed by the patient, have an available bed and are willing to accept the patient.  Yes   Patient/family informed of [facility]'s ownership interest in [facility] and [facility], as well as of the fact that they are under no obligation to receive care at these facilities.  PASRR submitted to EDS on 10/19/14     PASRR number received on 10/19/14     Existing PASRR number confirmed on       FL2 transmitted to all facilities in geographic area requested by pt/family on 10/19/14     FL2 transmitted to all facilities within larger geographic area on       Patient informed that his/her managed care company has contracts with or will negotiate with certain facilities, including the following:   [facility] Medicare)     Yes   Patient/family informed of bed offers received.  Patient chooses bed at [facility]     Physician recommends and patient chooses bed at      Patient to be transferred to [facility] on  .  Patient to be transferred to facility by [facility])     Patient family notified on   of transfer.  Name of family member notified:        PHYSICIAN Please sign FL2, Please sign DNR, Please prepare priority  discharge summary, including medications, Please prepare prescriptions     Additional Comment:    _______________________________________________ Williemae Area, LCSW 10/20/2014, 3:40 PM

## 2014-10-20 NOTE — Evaluation (Signed)
Occupational Therapy Evaluation Patient Details Name: Kayla [name] MRN: [id number] DOB: Mar 12, 1924 Today's Date: 10/20/2014    History of Present Illness Patient is an 81 yo female admitted 11/03/2014 with pain Lt foot/LE impacting mobility, hypoxia, resp failure. On bipap in SDU x 3 days.   PMH:  hypertension, hypothyroidism, fibromyalgia, morbid obesity, GERD, chronic diastolic dysfunction, chronic lymphedema, chronic pain syndrome   Clinical Impression   Per pt, she was assisted by an aide 2x a week for a shower, could dress herself and ambulate with a rollator within her home prior to admission. She was dependent on a lift chair.  Her daughter performed IADL.  Pt presents with disorientation to time and situation and some memory deficits.  She is generally weak and requires +2 assist for bed mobility.  Did not transfer.  Pt is dependent in bathing and dressing. Recommending SNF for rehab upon discharge.  Will follow acutely.    Follow Up Recommendations  SNF;Supervision/Assistance - 24 hour    Equipment Recommendations       Recommendations for Other Services       Precautions / Restrictions Precautions Precautions: Fall Precaution Comments: watch 02 sats      Mobility Bed Mobility Overal bed mobility: Needs Assistance;+2 for physical assistance Bed Mobility: Supine to Sit;Sit to Supine     Supine to sit: +2 for physical assistance;Max assist Sit to supine: +2 for physical assistance;Max assist   General bed mobility comments: assist for trunk and LEs, reliant on rails, HOB up  Transfers                      Balance Overall balance assessment: Needs assistance Sitting-balance support: Feet supported Sitting balance-Leahy Scale: Poor                                      ADL Overall ADL's : Needs assistance/impaired Eating/Feeding: Minimal assistance;Bed level   Grooming: Wash/dry hands;Wash/dry face;Brushing hair;Moderate assistance;Bed  level   Upper Body Bathing: Bed level;Maximal assistance   Lower Body Bathing: Total assistance;+2 for physical assistance;Bed level   Upper Body Dressing : Moderate assistance;Bed level   Lower Body Dressing: Total assistance;+2 for physical assistance;Bed level                       Vision     Perception     Praxis      Pertinent Vitals/Pain Pain Assessment: No/denies pain     Hand Dominance Right   Extremity/Trunk Assessment Upper Extremity Assessment Upper Extremity Assessment: RUE deficits/detail;LUE deficits/detail RUE Deficits / Details: 3/5 with moderate edema RUE Coordination: decreased fine motor;decreased gross motor LUE Deficits / Details: 3/5 with moderate edema LUE Coordination: decreased fine motor;decreased gross motor   Lower Extremity Assessment Lower Extremity Assessment: Defer to PT evaluation       Communication Communication Communication: No difficulties   Cognition Arousal/Alertness: Awake/alert Behavior During Therapy: Flat affect Overall Cognitive Status: Impaired/Different from baseline Area of Impairment: Orientation;Memory Orientation Level: Disoriented to;Time;Situation   Memory: Decreased short-term memory             General Comments       Exercises Exercises:  (Performed AROM B shoulder FF and elbow flex and ext x 10 )     Shoulder Instructions      Home Living Family/patient expects to be discharged to:: Skilled nursing facility Living Arrangements:  Spouse/significant other;Children (daughter)                           Home Equipment: Walker - 4 wheels;Cane - single point;Shower seat (lift chair)          Prior Functioning/Environment Level of Independence: Needs assistance  Gait / Transfers Assistance Needed: Patient uses rollator for ambulation short distances in house (20-30').  Patient uses lift chair during day and sleeps in it at night. ADL's / Homemaking Assistance Needed: Aide  assists patient with shower 2x/week. Reports she could dress herself.  Daughter prepares meals and does housekeeping.        OT Diagnosis: Generalized weakness;Cognitive deficits   OT Problem List: Decreased strength;Decreased activity tolerance;Impaired balance (sitting and/or standing);Obesity;Cardiopulmonary status limiting activity;Decreased cognition;Decreased coordination;Increased edema;Impaired UE functional use   OT Treatment/Interventions: Self-care/ADL training;Patient/family education;Balance training;Therapeutic exercise;Therapeutic activities;Cognitive remediation/compensation    OT Goals(Current goals can be found in the care plan section) Acute Rehab OT Goals Patient Stated Goal: to return to PLOF OT Goal Formulation: With patient Time For Goal Achievement: 11/03/14 Potential to Achieve Goals: Fair ADL Goals Pt Will Perform Grooming: with supervision;sitting Pt/caregiver will Perform Home Exercise Program: Both right and left upper extremity;With Supervision (AROM B UEs x 15 reps in supine) Additional ADL Goal #1: Pt will perform bed mobility with +2 min assist in preparation for ADL at EOB. Additional ADL Goal #2: Pt will sit with supervision at EOB x 10 minutes.  OT Frequency: Min 2X/week   Barriers to D/C:            Co-evaluation              End of Session Equipment Utilized During Treatment: Oxygen  Activity Tolerance: Patient limited by fatigue Patient left: in bed;with call bell/phone within reach   Time: 1150-1210 OT Time Calculation (min): 20 min Charges:  OT General Charges $OT Visit: 1 Procedure OT Evaluation $Initial OT Evaluation Tier I: 1 Procedure G-Codes:    Malka So 10/20/2014, 12:22 PM  [phone]

## 2014-10-21 ENCOUNTER — Telehealth: Payer: Self-pay | Admitting: Internal Medicine

## 2014-10-21 DIAGNOSIS — J9602 Acute respiratory failure with hypercapnia: Secondary | ICD-10-CM

## 2014-10-21 DIAGNOSIS — G9340 Encephalopathy, unspecified: Secondary | ICD-10-CM

## 2014-10-21 DIAGNOSIS — Z515 Encounter for palliative care: Secondary | ICD-10-CM

## 2014-10-21 LAB — POCT I-STAT 3, ART BLOOD GAS (G3+)
Acid-Base Excess: 9 mmol/L — ABNORMAL HIGH (ref 0.0–2.0)
BICARBONATE: 39.9 meq/L — AB (ref 20.0–24.0)
O2 SAT: 93 %
PO2 ART: 83 mmHg (ref 80.0–100.0)
TCO2: 43 mmol/L (ref 0–100)
pCO2 arterial: 94.4 mmHg (ref 35.0–45.0)
pH, Arterial: 7.234 — ABNORMAL LOW (ref 7.350–7.450)

## 2014-10-21 LAB — PROCALCITONIN: Procalcitonin: <0.1 ng/mL

## 2014-10-21 LAB — CULTURE, BLOOD (ROUTINE X 2)
CULTURE: NO GROWTH
Culture: NO GROWTH

## 2014-10-21 LAB — CLOSTRIDIUM DIFFICILE BY PCR: Toxigenic C. Difficile by PCR: NEGATIVE

## 2014-10-21 LAB — AMMONIA: AMMONIA: 22 umol/L (ref 9–35)

## 2014-10-21 MED ORDER — LOPERAMIDE HCL 2 MG PO CAPS
2.0000 mg | ORAL_CAPSULE | ORAL | Status: DC | PRN
  Filled 2014-10-21: qty 1

## 2014-10-21 NOTE — Progress Notes (Signed)
Patient had removed BIPAP mask and refused to put back on.  Said she needed a rest.  Placed patient on 3L [place].  Discussed with RN.  Will give patient a 30 minute break from BIPAP.

## 2014-10-21 NOTE — Progress Notes (Signed)
The patient received a Xopenex tx at 0645 this morning for wheezing.  She is still on 4L of O2.  She gained approximately a pound overnight and slept a large part of the night, but was easily arousable.  She was placed on enteric precautions because she had a third loose bowel movement within 24 hours.  A sample was sent to the lab and results are pending.

## 2014-10-21 NOTE — Progress Notes (Signed)
Placed patient back on BIPAP.  Patient is tolerating well at this time.

## 2014-10-21 NOTE — Progress Notes (Signed)
Physical Therapy Treatment Patient Details Name: Kayla [name] MRN: [id number] DOB: Jan 20, 1924 Today's Date: 10/21/2014    History of Present Illness Patient is an 81 yo female admitted 10/12/2014 with pain Lt foot/LE impacting mobility, hypoxia, resp failure. On bipap in SDU x 3 days.   PMH:  hypertension, hypothyroidism, fibromyalgia, morbid obesity, GERD, chronic diastolic dysfunction, chronic lymphedema, chronic pain syndrome    PT Comments    Patient was agreeable to sit EOB and then asked if she wanted to transfer she was excited to get into the chair. Patient required +2 assist to transfer to the recliner. LE elevated. Patient with flat affect and not speaking much at all. Continue to recommend SNF for ongoing Physical Therapy.     Follow Up Recommendations  SNF;Supervision/Assistance - 24 hour     Equipment Recommendations  None recommended by PT    Recommendations for Other Services       Precautions / Restrictions Precautions Precautions: Fall Precaution Comments: watch 02 sats Restrictions LLE Weight Bearing: Weight bearing as tolerated    Mobility  Bed Mobility Overal bed mobility: Needs Assistance;+2 for physical assistance       Supine to sit: +2 for physical assistance;Max assist Sit to supine: +2 for physical assistance;Max assist   General bed mobility comments: assist for trunk and LEs, reliant on rails, HOB up and use of pad  Transfers Overall transfer level: Needs assistance Equipment used: Rolling walker (2 wheeled)   Sit to Stand: Mod assist;+2 physical assistance Stand pivot transfers: Mod assist;+2 physical assistance       General transfer comment: Verbal cues for hand placement and technique.  Assist to rise to standing and for balance.  Patient able to take several steps to pivot to chair.  Cues to lower self into chair slowly  Ambulation/Gait                 Stairs            Wheelchair Mobility    Modified Rankin (Stroke  Patients Only)       Balance     Sitting balance-Leahy Scale: Fair       Standing balance-Leahy Scale: Poor                      Cognition Arousal/Alertness: Awake/alert Behavior During Therapy: Flat affect Overall Cognitive Status: Impaired/Different from baseline Area of Impairment: Orientation;Memory Orientation Level: Disoriented to;Time;Situation   Memory: Decreased short-term memory              Exercises      General Comments        Pertinent Vitals/Pain Pain Assessment: No/denies pain    Home Living                      Prior Function            PT Goals (current goals can now be found in the care plan section) Progress towards PT goals: Progressing toward goals    Frequency  Min 3X/week    PT Plan Current plan remains appropriate    Co-evaluation             End of Session Equipment Utilized During Treatment: Oxygen Activity Tolerance: Patient tolerated treatment well Patient left: in chair;with call bell/phone within reach     Time: 0731-0752 PT Time Calculation (min) (ACUTE ONLY): 21 min  Charges:  $Therapeutic Activity: 8-22 mins  G Codes:      Jacqualyn Posey 10/21/2014, 8:23 AM  10/21/2014 Jacqualyn Posey PTA [phone] pager [phone] office

## 2014-10-21 NOTE — Progress Notes (Signed)
[facility] PROGRESS NOTE  AREATHA KALATA WUJ:[id number] DOB: 08/02/1923 DOA: 10/28/2014 PCP: Viviana Simpler, MD Brief narrative 81 year old female with history of hypertension, hypothyroidism, fibromyalgia, morbid obesity, GERD, diastolic dysfunction chronic lymphedema chronic pain syndrome presented with left heel pain off almost 4 weeks duration. She was discharged from the ED with oxycodone and gabapentin as she had difficulty ambulating and was instructed to increase dose of gabapentin as needed. However patient had persistent pain and return to the ED. Patient in the ED was found to be in acute on chronic respiratory failure with hypoxia. His chest x-ray showing mild patchy left lower lobe opacity. Patient admitted to hospitalist service but however became bradycardic and hypotensive overnight. Patient was placed on BiPAP and transferred to stepdown unit.  Assessment/Plan: Acute on chronic hypoxic and hypercapnic respiratory failure  Possibly a combination of increased narcotic use, diastolic CHF and? Pneumonia -now off BiPAP and maintaining O2 sat on 4L. However still sleepy with signs of resp failure. Will check ABG. May need BiPAP. -Completed empiric Zosyn for pneumonia . Marland Kitchen Recent venous Doppler negative for DVT. -Palliative care consulted by previous hospitalist after discussion with patient's daughter.  Acute encephalopathy Possibly in the settting of hypercapnic resp failure. Check ABG and ammonia level  ? Left plantar fasciitis Has underlying chronic lymphedema Discontinued long-acting narcotics including fentanyl patch. Avoiding NSAIDs. Use Tylenol when necessary tramadol as needed only. Seen by physical therapy and recommends skilled nursing facility. Pending repeat PT follow up.  Diarrhea on 6/17  stool for cdiff ngative. Add prn imodium  Bradycardia Patient placed back on low-dose Coreg and tolerating well. Appreciate cardiology evaluation.   Essential  hypertension Patient on multiple blood pressure medications including Tekturna, Coreg, hydralazine, lisinopril and Demadex. All medications were held due to hypotension and bradycardia on admission.   Acute kidney injury Possibly secondary to ?UTI. Now resolved.holding ACEi/ tekturna due to AKI and hyperkalemia.  Chronic diastolic CHF When necessary IV Lasix. Continue low-dose Coreg. Held other blood pressure medications.  Anxiety and confusion Has hypercapnic resp failure. Avoiding benzos  Diet: Regular with thin liquids ( seen by swallow nurse)  DVT prophylaxis: Subcutaneous Lovenox   Code Status: DO NOT RESUSCITATE Family Communication: daughter at bedside  Disposition Plan: palliative care discussion for [place] today. SNF possibly on 6/18   Consultants:  Cardiology  Procedures:  None  Antibiotics:  IV Zosyn since 6/12--6/16  HPI/Subjective: Recent seen and examined. Appears sleepy and some confusion Objective: Filed Vitals:   10/21/14 0645  BP: 152/87  Pulse:   Temp:   Resp:     Intake/Output Summary (Last 24 hours) at 10/21/14 9562 Last data filed at 10/21/14 0656  Gross per 24 hour  Intake   1110 ml  Output      0 ml  Net   1110 ml   Filed Weights   10/19/14 1730 10/20/14 0513 10/21/14 0604  Weight: 124.9 kg (275 lb 5.7 oz) 122.7 kg (270 lb 8.1 oz) 123.5 kg (272 lb 4.3 oz)    Exam:   General: Elderly obese female, appears sleepy  HEENT:  moist oral mucosa, supple neck, no JVD  Chest: diminished breath sounds due to body habitus  Cardiovascular: Normal S1 and S2, no murmurs rub or gallop  GI: Soft, nondistended, nontender, bowel sounds present  Musculoskeletal: Warm, no edema, chronic lymphedema   CNS: sleepy but arousable and mildly confused. Flapping tremors  Data Reviewed: Basic Metabolic Panel:  Recent Labs Lab 10/15/14 0630 10/16/14 0246  10/17/14 1308  10/17/14 1230 10/18/14 0220 10/19/14 0530 10/20/14 0545  NA 138 136   --  140  --  139 143 143  K 5.0 5.5*  < > 5.5* 5.1 5.1 4.0 4.0  CL 104 104  --  106  --  104 102 100*  CO2 29 26  --  30  --  26 33* 36*  GLUCOSE 108* 100*  --  105*  --  94 98 97  BUN 27* 30*  --  25*  --  23* 16 16  CREATININE 1.46* 1.32*  --  1.11*  --  1.06* 0.88 0.83  CALCIUM 9.7 9.6  --  9.9  --  10.1 10.2 10.2  MG 2.1  --   --   --   --   --   --   --   < > = values in this interval not displayed. Liver Function Tests:  Recent Labs Lab 10/15/14 0630 10/16/14 0246 10/17/14 0223 10/18/14 0220 10/19/14 0530  AST 15 <5* 15 21 15   ALT 14 15 14 14 14   ALKPHOS 41 47 41 48 48  BILITOT 0.5 0.5 0.5 1.0 1.2  PROT 6.2* 6.3* 6.1* 6.4* 6.4*  ALBUMIN 3.1* 3.3* 2.9* 2.9* 2.7*   No results for input(s): LIPASE, AMYLASE in the last 168 hours. No results for input(s): AMMONIA in the last 168 hours. CBC:  Recent Labs Lab 10/30/2014 1958 10/15/14 0630 10/16/14 0246 10/19/14 0530  WBC 7.1 6.8 8.1 8.4  NEUTROABS 5.4 5.0  --   --   HGB 10.3* 10.1* 11.2* 11.0*  HCT 33.8* 33.8* 38.1 35.8*  MCV 93.4 96.0 96.9 93.5  PLT 201 200 248 226   Cardiac Enzymes:  Recent Labs Lab 10/15/14 0630 10/15/14 1125  TROPONINI <0.03 <0.03   BNP (last 3 results)  Recent Labs  10/18/2014 1958 10/17/14 1232 10/18/14 0220  BNP 212.6* 178.9* 217.8*    ProBNP (last 3 results)  Recent Labs  04/13/14 1556  PROBNP 470.9*    CBG: No results for input(s): GLUCAP in the last 168 hours.  Recent Results (from the past 240 hour(s))  MRSA PCR Screening     Status: None   Collection Time: 10/15/14  3:00 AM  Result Value Ref Range Status   MRSA by PCR NEGATIVE NEGATIVE Final    Comment:        The GeneXpert MRSA Assay (FDA approved for NASAL specimens only), is one component of a comprehensive MRSA colonization surveillance program. It is not intended to diagnose MRSA infection nor to guide or monitor treatment for MRSA infections.   Culture, blood (routine x 2)     Status: None    Collection Time: 10/15/14 11:25 AM  Result Value Ref Range Status   Specimen Description BLOOD RIGHT ANTECUBITAL  Final   Special Requests BOTTLES DRAWN AEROBIC ONLY 10CC  Final   Culture   Final    NO GROWTH 5 DAYS Performed at [facility]    Report Status 10/21/2014 FINAL  Final  Culture, blood (routine x 2)     Status: None   Collection Time: 10/15/14 11:33 AM  Result Value Ref Range Status   Specimen Description BLOOD LEFT ANTECUBITAL  Final   Special Requests BOTTLES DRAWN AEROBIC ONLY 5CC  Final   Culture   Final    NO GROWTH 5 DAYS Performed at [facility]    Report Status 10/21/2014 FINAL  Final  Urine culture     Status: None  Collection Time: 10/17/14  3:06 AM  Result Value Ref Range Status   Specimen Description URINE, RANDOM  Final   Special Requests Normal  Final   Colony Count   Final    25,000 COLONIES/ML Performed at [facility]    Culture   Final    Multiple bacterial morphotypes present, none predominant. Suggest appropriate recollection if clinically indicated. Performed at [facility]    Report Status 10/18/2014 FINAL  Final  Clostridium Difficile by PCR (not at [facility])     Status: None   Collection Time: 10/20/14 11:50 PM  Result Value Ref Range Status   C difficile by pcr NEGATIVE NEGATIVE Final     Studies: No results found.  Scheduled Meds: . aspirin EC  81 mg Oral Daily  . carvedilol  12.5 mg Oral BID WC  . enoxaparin (LOVENOX) injection  65 mg Subcutaneous Q24H  . nystatin  5 mL Oral TID AC & HS  . nystatin   Topical BID  . piperacillin-tazobactam (ZOSYN)  IV  3.375 g Intravenous 3 times per day  . senna-docusate  2 tablet Oral QHS  . sodium chloride  3 mL Intravenous Q12H  . torsemide  20 mg Oral Daily  . triamcinolone  2 spray Nasal Daily   Continuous Infusions:     Time spent: 25 minutes    Kajuana Shareef, [place]  [facility] Pager [phone]. If 7PM-7AM, please contact night-coverage at  [email or web address], password [email or web address] 10/21/2014, 8:22 AM  LOS: 7 days

## 2014-10-21 NOTE — Consult Note (Signed)
Consultation Note Date: 10/21/2014   Patient Name: Kayla [name]  DOB: 05/16/1923  MRN: [id number]  Age / Sex: 81 y.o., female   PCP: Venia Carbon, MD Referring Physician: Louellen Molder, MD  Reason for Consultation: Establishing goals of care  Palliative Care Assessment and Plan Summary of Established Goals of Care and Medical Treatment Preferences    Palliative Care Discussion Held Today:   Kayla [name] is back on BiPAP and lethargic. I met with her children, Kayla [name] and Kayla [name]. They are very tearful but reasonable and understanding. We discussed numerous possibilities in outcomes. They have decided to proceed with trial of BiPAP over the weekend and reevaluate on Monday - open to comfort care/hospice if no improvement or decline. They do not want her to die at home and would be open to hospice facility. With improvement - at best - she would need SNF as their ability to care for her at home has been too difficult.  Of note, Kayla [name] has made many comments to her family about being tired and ready to die. She sounds like she has had peace and acceptance although her children say she fears loss of control. She has been sick for over 20 yrs, declined over the past 5 yrs and great decline over past few months. QOL suffers immensely with much chronic pain.    Contacts/Participants in Discussion: Primary Decision Maker: Kayla [name] and Kayla [name] say they are her HCPOA although her husband is competent and will be included in future conversations. Children wanted to discuss with him privately first.   Goals of Care/Code Status/Advance Care Planning:   Code Status: DNR   Symptom Management:   Bowel regimen: Continue senokot 2 tablets qhs when able to take po.   AMS: Continue BiPAP over weekend.   Pain: Lethargic so difficult to tell but no overt signs of pain.   Psycho-social/Spiritual:   Support System: Great support. Husband and children at bedside and other family members visiting.    Prognosis: Unable to determine but likely very poor (could be as little as days)  Discharge Planning:  To be determined.       Chief Complaint/HPI: 81 yo female with history of hypertension, hypothyroidism, fibromyalgia, morbid obesity, GERD, diastolic heart failure, chronic lymphedema, and chronic pain syndrome presented to ED with left heel pain for almost 4 weeks duration. She was discharged from the ED with oxycodone and gabapentin as she had difficulty ambulating and was instructed to increase dose of gabapentin as needed. However patient had persistent pain and return to the ED. Patient in the ED was found to be in acute on chronic respiratory failure with hypoxia. Chest x-ray showing mild patchy left lower lobe opacity, became bradycardic and hypotensive overnight and was placed on BiPAP and transferred to stepdown unit. She has since improved and maintaining off of BiPAP until today when more lethargic and CO2 90s - back on BiPAP.    Primary Diagnoses  Present on Admission:  . Acute respiratory failure with hypoxia and hypercarbia . Chronic diastolic heart failure . Chronic venous insufficiency . Essential hypertension, benign . Hypothyroidism . Pulmonary hypertension . Plantar fasciitis of left foot  Palliative Review of Systems:   Unable to assess - lethargic on BiPAP   I have reviewed the medical record, interviewed the patient and family, and examined the patient. The following aspects are pertinent.  Past Medical History  Diagnosis Date  . HTN (hypertension)     difficult to control  . Hypothyroidism   .  OP (osteoporosis)   . Fibromyalgia   . Edema of both legs     chronic  . Degenerative disk disease     cervical  . Follicular lymphoma     Quiescent. Dr. Sunday Corn; Dr. Elenore Rota  . Meningioma     Left frontal  . GERD (gastroesophageal reflux disease)   . Obesity   . Cerebral hemorrhage 2004    hypertensive  . Hx: UTI (urinary tract infection)   . Renal  artery aneurysm     with stenosis-right; followed by Dr. Gilda Crease  . Diastolic CHF, chronic     Echo (9-11) with EF 65-70%, mild LVH, mid AI, Mild MR, PA systolic pressure >   History   Social History  . Marital Status: Married    Spouse Name: N/A  . Number of Children: N/A  . Years of Education: N/A   Occupational History  . Retired- Scientist, clinical (histocompatibility and immunogenetics) and taught    Social History Main Topics  . Smoking status: Never Smoker   . Smokeless tobacco: Never Used     Comment: Quit smoking 50 years ago  . Alcohol Use: No  . Drug Use: No  . Sexual Activity: Not on file   Other Topics Concern  . None   Social History Narrative   Has living will   Daughter Kayla [name] is health care POA   Has DNR order already-- reviewed and form redone 01/29/13   No tube feeds if cognitively unaware   Family History  Problem Relation Age of Onset  . Stroke Father   . Cancer Mother     Myeloma  . Hypertension      family   Scheduled Meds: . aspirin EC  81 mg Oral Daily  . carvedilol  12.5 mg Oral BID WC  . enoxaparin (LOVENOX) injection  65 mg Subcutaneous Q24H  . nystatin  5 mL Oral TID AC & HS  . nystatin   Topical BID  . piperacillin-tazobactam (ZOSYN)  IV  3.375 g Intravenous 3 times per day  . senna-docusate  2 tablet Oral QHS  . sodium chloride  3 mL Intravenous Q12H  . torsemide  20 mg Oral Daily  . triamcinolone  2 spray Nasal Daily   Continuous Infusions:  PRN Meds:.sodium chloride, acetaminophen, levalbuterol, loperamide, menthol-cetylpyridinium, ondansetron (ZOFRAN) IV, sodium chloride, traMADol Medications Prior to Admission:  Prior to Admission medications   Medication Sig Start Date End Date Taking? Authorizing Provider  acetaminophen (TYLENOL) 500 MG tablet Take 500 mg by mouth every morning.   Yes Historical Provider, MD  aliskiren (TEKTURNA) 300 MG tablet TAKE ONE TABLET BY MOUTH ONCE DAILY 09/14/14  Yes Laurey Morale, MD  aspirin 81 MG tablet Take 81 mg by mouth daily.      Yes Historical Provider, MD  carvedilol (COREG) 25 MG tablet Take 25 mg by mouth daily at 6 PM.  03/22/13  Yes Iran Ouch, MD  cetirizine (ZYRTEC) 10 MG tablet Take 10 mg by mouth at bedtime.   Yes Historical Provider, MD  Cholecalciferol (VITAMIN D3) 2000 UNITS capsule Take 2,000 Units by mouth daily.   Yes Historical Provider, MD  fentaNYL (DURAGESIC - DOSED MCG/HR) 12 MCG/HR Place 1 patch (12.5 mcg total) onto the skin every 3 (three) days. along with the patch 10/16/2014  Yes Eustaquio Boyden, MD  fentaNYL (DURAGESIC - DOSED MCG/HR) 25 MCG/HR patch Place 1 patch (25 mcg total) onto the skin every 3 (three) days. 10/27/2014  Yes Eustaquio Boyden, MD  gabapentin (NEURONTIN) 100 MG capsule Take 1 capsule (100 mg total) by mouth at bedtime. 10/07/14  Yes Alexa Sherral Hammers, MD  hydrALAZINE (APRESOLINE) 25 MG tablet Take 1 tablet (25 mg total) by mouth 3 (three) times daily. 03/30/14  Yes Wellington Hampshire, MD  lisinopril (PRINIVIL,ZESTRIL) 40 MG tablet TAKE ONE TABLET BY MOUTH ONCE DAILY 05/02/14  Yes Wellington Hampshire, MD  Lutein-Zeaxanthin 6-1 MG TABS Take 1 tablet by mouth daily.     Yes Historical Provider, MD  NONFORMULARY OR COMPOUNDED ITEM T3 3.1 mcg/ T4 12.5 mcg take 1 capsule by mouth daily Patient taking differently: T3 3.1 mcg/ T4 12.5 mcg take 1 capsule by mouth daily at supper 05/10/14  Yes Venia Carbon, MD  nystatin (MYCOSTATIN) 100000 UNIT/ML suspension Take 5 mLs (500,000 Units total) by mouth 4 (four) times daily. Use until resolved and then for 2 more days additionally. 09/30/14  Yes Tonia Ghent, MD  oxyCODONE (OXY IR/ROXICODONE) 5 MG immediate release tablet Take 1 tablet (5 mg total) by mouth 2 (two) times daily as needed for severe pain. 09/23/14  Yes Venia Carbon, MD  phenazopyridine (PYRIDIUM) 95 MG tablet Take 95 mg by mouth 2 (two) times daily.   Yes Historical Provider, MD  potassium chloride SA (KLOR-CON M20) 20 MEQ tablet Take 1 tablet (20 mEq total) by mouth  daily. Take extra tab when you take extra Torsemide Patient taking differently: Take 10 mEq by mouth daily. Take extra tab when you take extra Torsemide 09/14/14  Yes Larey Dresser, MD  sennosides-docusate sodium (SENOKOT-S) 8.6-50 MG tablet Take 1 tablet by mouth 2 (two) times daily.    Yes Historical Provider, MD  Simethicone (GAS-X MAXIMUM STRENGTH PO) Take 1 tablet by mouth 2 (two) times daily.    Yes Historical Provider, MD  torsemide (DEMADEX) 20 MG tablet Take 1 tablet (20 mg total) by mouth daily. Take an additional 20 mg on Wednesdays 07/25/14  Yes Jolaine Artist, MD  triamcinolone (NASACORT) 55 MCG/ACT AERO nasal inhaler Place 2 sprays into the nose daily.   Yes Historical Provider, MD   Allergies  Allergen Reactions  . Amoxicillin-Pot Clavulanate     unknown  . Augmentin [Amoxicillin-Pot Clavulanate]   . Cefuroxime Axetil     unknown  . Cephalexin     Felt uncomfortable  . Levaquin [Levofloxacin In D5w]     unknown  . Sulfonamide Derivatives     unknown   CBC:    Component Value Date/Time   WBC 8.4 10/19/2014 0530   WBC 6.3 05/29/2011 1317   HGB 11.0* 10/19/2014 0530   HGB 13.7 12/08/2007 1051   HCT 35.8* 10/19/2014 0530   HCT 40.2 12/08/2007 1051   PLT 226 10/19/2014 0530   PLT 239 12/08/2007 1051   MCV 93.5 10/19/2014 0530   MCV 81 12/08/2007 1051   NEUTROABS 5.0 10/15/2014 0630   NEUTROABS 4.4 05/29/2011 1317   NEUTROABS 3.6 12/08/2007 1051   LYMPHSABS 0.8 10/15/2014 0630   LYMPHSABS 1.2 05/29/2011 1317   LYMPHSABS 1.5 12/08/2007 1051   MONOABS 0.8 10/15/2014 0630   EOSABS 0.2 10/15/2014 0630   EOSABS 0.1 05/29/2011 1317   EOSABS 0.2 12/08/2007 1051   BASOSABS 0.0 10/15/2014 0630   BASOSABS 0.0 05/29/2011 1317   BASOSABS 0.0 12/08/2007 1051   Comprehensive Metabolic Panel:    Component Value Date/Time   NA 143 10/20/2014 0545   NA 140 08/15/2011 1055   K 4.0 10/20/2014 0545   CL  100* 10/20/2014 0545   CO2 36* 10/20/2014 0545   BUN 16  10/20/2014 0545   BUN 21 08/15/2011 1055   CREATININE 0.83 10/20/2014 0545   CREATININE 0.78 01/11/2011 1527   GLUCOSE 97 10/20/2014 0545   GLUCOSE 99 08/15/2011 1055   CALCIUM 10.2 10/20/2014 0545   CALCIUM 10.2 07/06/2010 2354   AST 15 10/19/2014 0530   ALT 14 10/19/2014 0530   ALKPHOS 48 10/19/2014 0530   BILITOT 1.2 10/19/2014 0530   PROT 6.4* 10/19/2014 0530   PROT 6.9 05/29/2011 1317   ALBUMIN 2.7* 10/19/2014 0530    Physical Exam:  Vital Signs: BP 152/87 mmHg  Pulse 60  Temp(Src) 97.5 F (36.4 C) (Oral)  Resp 20  Ht $R'5\' 2"'SN$  (1.575 m)  Wt 123.5 kg (272 lb 4.3 oz)  BMI 49.79 kg/m2  SpO2 96% SpO2: SpO2: 96 % O2 Device: O2 Device: Nasal Cannula O2 Flow Rate: O2 Flow Rate (L/min): 4 L/min Intake/output summary:  Intake/Output Summary (Last 24 hours) at 10/21/14 0855 Last data filed at 10/21/14 0656  Gross per 24 hour  Intake    990 ml  Output      0 ml  Net    990 ml   LBM: Last BM Date: 10/20/14 Baseline Weight: Weight: 129.4 kg (285 lb 4.4 oz) Most recent weight: Weight: 123.5 kg (272 lb 4.3 oz) (bedscale)  Exam Findings:  General: NAD, lying in bed, obese HEENT: [place]/AT, moist mucous membranes, thrush CVS: RRR Resp: No labored breathing, BiPAP Abd: Soft, NT, ND Extrem: BLE 3+ edema Skin: Dry Neuro: Lethargic, arousable           Palliative Performance Scale: 20%                Additional Data Reviewed: Recent Labs     10/19/14  0530  10/20/14  0545  WBC  8.4   --   HGB  11.0*   --   PLT  226   --   NA  143  143  BUN  16  16  CREATININE  0.88  0.83     Time In: 1320 Time Out: 1440 Time Total: 44min  Greater than 50%  of this time was spent counseling and coordinating care related to the above assessment and plan.   Signed by:  Vinie Sill, NP Palliative Medicine Team Pager # [phone] (M-F 8a-5p) Team Phone # [phone] (Nights/Weekends)

## 2014-10-21 NOTE — Telephone Encounter (Signed)
Daughter would like to talk with dr Silvio Pate about mom's (pt) situation [phone].

## 2014-10-21 NOTE — Progress Notes (Signed)
Pt slightly difficult to arouse this AM.  Answers questions appropriately, but unsure of the year.  Order obtained for blood gas.  CO2 reading 94.4, MD notified.  Order for transfer and continuous bipap placed.  Pt transferred to [facility].

## 2014-10-21 NOTE — Telephone Encounter (Signed)
Left message

## 2014-10-22 LAB — BLOOD GAS, ARTERIAL
Acid-Base Excess: 9.1 mmol/L — ABNORMAL HIGH (ref 0.0–2.0)
BICARBONATE: 35.9 meq/L — AB (ref 20.0–24.0)
DELIVERY SYSTEMS: POSITIVE
Drawn by: 405301
Expiratory PAP: 6
FIO2: 0.4 %
Inspiratory PAP: 16
LHR: 8 {breaths}/min
O2 SAT: 96.8 %
Patient temperature: 98.6
TCO2: 38.3 mmol/L (ref 0–100)
pCO2 arterial: 79.6 mmHg (ref 35.0–45.0)
pH, Arterial: 7.276 — ABNORMAL LOW (ref 7.350–7.450)
pO2, Arterial: 100 mmHg (ref 80.0–100.0)

## 2014-10-22 MED ORDER — CHLORHEXIDINE GLUCONATE 0.12 % MT SOLN
15.0000 mL | Freq: Two times a day (BID) | OROMUCOSAL | Status: DC
  Administered 2014-10-22 – 2014-10-23 (×4): 15 mL via OROMUCOSAL
  Filled 2014-10-22 (×7): qty 15

## 2014-10-22 MED ORDER — HALOPERIDOL LACTATE 5 MG/ML IJ SOLN
2.0000 mg | Freq: Four times a day (QID) | INTRAMUSCULAR | Status: DC | PRN
  Administered 2014-10-22: 2 mg via INTRAVENOUS
  Filled 2014-10-22: qty 1

## 2014-10-22 MED ORDER — LORAZEPAM 2 MG/ML IJ SOLN
0.5000 mg | Freq: Once | INTRAMUSCULAR | Status: AC
  Administered 2014-10-22: 0.5 mg via INTRAVENOUS
  Filled 2014-10-22: qty 1

## 2014-10-22 MED ORDER — CETYLPYRIDINIUM CHLORIDE 0.05 % MT LIQD
7.0000 mL | Freq: Two times a day (BID) | OROMUCOSAL | Status: DC
  Administered 2014-10-22 – 2014-10-23 (×3): 7 mL via OROMUCOSAL

## 2014-10-22 NOTE — Progress Notes (Signed)
Fredirick Maudlin, NP, was notified of pt abnormal ABG.

## 2014-10-22 NOTE — Progress Notes (Addendum)
[facility] PROGRESS NOTE  Kayla [name] WYO:[id number] DOB: 1924/04/14 DOA: 10/26/2014 PCP: Viviana Simpler, MD Brief narrative 81 year old female with history of hypertension, hypothyroidism, fibromyalgia, morbid obesity, GERD, diastolic dysfunction chronic lymphedema chronic pain syndrome presented with left heel pain off almost 4 weeks duration. She was discharged from the ED with oxycodone and gabapentin as she had difficulty ambulating and was instructed to increase dose of gabapentin as needed. However patient had persistent pain and return to the ED. Patient in the ED was found to be in acute on chronic respiratory failure with hypoxia. His chest x-ray showing mild patchy left lower lobe opacity. Patient admitted to hospitalist service but however became bradycardic and hypotensive overnight. Patient was placed on BiPAP and transferred to stepdown unit.  Assessment/Plan: Acute on chronic hypercapnic respiratory failure   combination of increased narcotic use, diastolic CHF and? Pneumonia -requirng BiPAP for persistent hypercapnia with encephaloapthy -Completed empiric Zosyn for pneumonia . Marland Kitchen Recent venous Doppler negative for DVT. -monitor in stepdown with periodic BiPAP use ( will place her on 3-4 hrs during the day and at bedtime) .repeat ABG in am. Palliative care consult appreciated.   Acute encephalopathy in the settting of hypercapnic resp failure. Repeat CO2 improving but still high. Avoid benzos or narcotics. May use low dose prn haldol for agitation.  ? Left plantar fasciitis Has underlying chronic lymphedema Discontinued long-acting narcotics including fentanyl patch. Avoiding NSAIDs. Use Tylenol when necessary tramadol as needed only. Seen by physical therapy and recommends skilled nursing facility. Seen by PT again.   Diarrhea on 6/17  stool for cdiff ngative.  Prn imodium  Bradycardia Patient placed back on low-dose Coreg and tolerating well. Appreciate cardiology  evaluation.   Essential hypertension Patient on multiple blood pressure medications including Tekturna, Coreg, hydralazine, lisinopril and Demadex. All medications were held due to hypotension and bradycardia on admission.   Acute kidney injury Possibly secondary to ?UTI. Now resolved.holding ACEi/ tekturna due to AKI and hyperkalemia.  Chronic diastolic CHF When necessary IV Lasix. Continue low-dose Coreg. Held other blood pressure medications.  Anxiety and confusion Has hypercapnic resp failure. Avoiding benzos  Diet: Regular with thin liquids ( seen by swallow nurse)  DVT prophylaxis: Subcutaneous Lovenox   Code Status: DO NOT RESUSCITATE Family Communication: spoke with daughter on the phone  Disposition Plan: palliative care discussion on 6/17. Will observe over the weekend. If respiratory failure continues to persist with none or minimal improvement will aim for hospice Spoke with daughter on the phone. She understands that her mother has not been in good health for several years and now  Declining. She agrees to watch for another day and if no improvement or deterioration wishes on sending her to skilled nursing with palliative care/ hospice.   Consultants:  Cardiology  Procedures:  None  Antibiotics:  IV Zosyn since 6/12--6/16  HPI/Subjective: Recent seen and examined. Appears sleepy and some confusion Objective: Filed Vitals:   10/22/14 0800  BP: 127/59  Pulse: 81  Temp:   Resp: 19    Intake/Output Summary (Last 24 hours) at 10/22/14 0906 Last data filed at 10/22/14 0622  Gross per 24 hour  Intake    150 ml  Output      0 ml  Net    150 ml   Filed Weights   10/21/14 0604 10/21/14 1121 10/22/14 0500  Weight: 123.5 kg (272 lb 4.3 oz) 124.7 kg (274 lb 14.6 oz) 124 kg (273 lb 5.9 oz)    Exam:  General: Elderly obese female, sleepy  HEENT:  moist oral mucosa, supple neck, no JVD  Chest: diminished breath sounds due to body  habitus  Cardiovascular: Normal S1 and S2, no murmurs rub or gallop  GI: Soft, nondistended, nontender, bowel sounds present  Musculoskeletal: Warm, no edema, chronic lymphedema   CNS: awake and answering questions but groggy , Flapping tremors  Data Reviewed: Basic Metabolic Panel:  Recent Labs Lab 10/16/14 0246  10/17/14 0223 10/17/14 1230 10/18/14 0220 10/19/14 0530 10/20/14 0545  NA 136  --  140  --  139 143 143  K 5.5*  < > 5.5* 5.1 5.1 4.0 4.0  CL 104  --  106  --  104 102 100*  CO2 26  --  30  --  26 33* 36*  GLUCOSE 100*  --  105*  --  94 98 97  BUN 30*  --  25*  --  23* 16 16  CREATININE 1.32*  --  1.11*  --  1.06* 0.88 0.83  CALCIUM 9.6  --  9.9  --  10.1 10.2 10.2  < > = values in this interval not displayed. Liver Function Tests:  Recent Labs Lab 10/16/14 0246 10/17/14 0223 10/18/14 0220 10/19/14 0530  AST <5* 15 21 15   ALT 15 14 14 14   ALKPHOS 47 41 48 48  BILITOT 0.5 0.5 1.0 1.2  PROT 6.3* 6.1* 6.4* 6.4*  ALBUMIN 3.3* 2.9* 2.9* 2.7*   No results for input(s): LIPASE, AMYLASE in the last 168 hours.  Recent Labs Lab 10/21/14 1041  AMMONIA 22   CBC:  Recent Labs Lab 10/16/14 0246 10/19/14 0530  WBC 8.1 8.4  HGB 11.2* 11.0*  HCT 38.1 35.8*  MCV 96.9 93.5  PLT 248 226   Cardiac Enzymes:  Recent Labs Lab 10/15/14 1125  TROPONINI <0.03   BNP (last 3 results)  Recent Labs  10/13/2014 1958 10/17/14 1232 10/18/14 0220  BNP 212.6* 178.9* 217.8*    ProBNP (last 3 results)  Recent Labs  04/13/14 1556  PROBNP 470.9*    CBG: No results for input(s): GLUCAP in the last 168 hours.  Recent Results (from the past 240 hour(s))  MRSA PCR Screening     Status: None   Collection Time: 10/15/14  3:00 AM  Result Value Ref Range Status   MRSA by PCR NEGATIVE NEGATIVE Final    Comment:        The GeneXpert MRSA Assay (FDA approved for NASAL specimens only), is one component of a comprehensive MRSA colonization surveillance  program. It is not intended to diagnose MRSA infection nor to guide or monitor treatment for MRSA infections.   Culture, blood (routine x 2)     Status: None   Collection Time: 10/15/14 11:25 AM  Result Value Ref Range Status   Specimen Description BLOOD RIGHT ANTECUBITAL  Final   Special Requests BOTTLES DRAWN AEROBIC ONLY 10CC  Final   Culture   Final    NO GROWTH 5 DAYS Performed at [facility]    Report Status 10/21/2014 FINAL  Final  Culture, blood (routine x 2)     Status: None   Collection Time: 10/15/14 11:33 AM  Result Value Ref Range Status   Specimen Description BLOOD LEFT ANTECUBITAL  Final   Special Requests BOTTLES DRAWN AEROBIC ONLY 5CC  Final   Culture   Final    NO GROWTH 5 DAYS Performed at [facility]    Report Status 10/21/2014 FINAL  Final  Urine culture     Status: None   Collection Time: 10/17/14  3:06 AM  Result Value Ref Range Status   Specimen Description URINE, RANDOM  Final   Special Requests Normal  Final   Colony Count   Final    25,000 COLONIES/ML Performed at [facility]    Culture   Final    Multiple bacterial morphotypes present, none predominant. Suggest appropriate recollection if clinically indicated. Performed at [facility]    Report Status 10/18/2014 FINAL  Final  Clostridium Difficile by PCR (not at [facility])     Status: None   Collection Time: 10/20/14 11:50 PM  Result Value Ref Range Status   C difficile by pcr NEGATIVE NEGATIVE Final     Studies: No results found.  Scheduled Meds: . antiseptic oral rinse  7 mL Mouth Rinse q12n4p  . aspirin EC  81 mg Oral Daily  . carvedilol  12.5 mg Oral BID WC  . chlorhexidine  15 mL Mouth Rinse BID  . enoxaparin (LOVENOX) injection  65 mg Subcutaneous Q24H  . nystatin  5 mL Oral TID AC & HS  . nystatin   Topical BID  . piperacillin-tazobactam (ZOSYN)  IV  3.375 g Intravenous 3 times per day  . senna-docusate  2 tablet Oral QHS  . sodium chloride  3  mL Intravenous Q12H  . torsemide  20 mg Oral Daily  . triamcinolone  2 spray Nasal Daily   Continuous Infusions:     Time spent: 35 minutes    Smrithi Pigford, Guys  [facility] Pager [phone]. If 7PM-7AM, please contact night-coverage at [email or web address], password [facility] 10/22/2014, 9:06 AM  LOS: 8 days

## 2014-10-23 LAB — BLOOD GAS, ARTERIAL
ACID-BASE EXCESS: 13.6 mmol/L — AB (ref 0.0–2.0)
Bicarbonate: 40.5 mEq/L — ABNORMAL HIGH (ref 20.0–24.0)
DRAWN BY: 307971
O2 CONTENT: 4 L/min
O2 Saturation: 97.8 %
Patient temperature: 98.6
TCO2: 43.2 mmol/L (ref 0–100)
pCO2 arterial: 87.8 mmHg (ref 35.0–45.0)
pH, Arterial: 7.286 — ABNORMAL LOW (ref 7.350–7.450)
pO2, Arterial: 102 mmHg — ABNORMAL HIGH (ref 80.0–100.0)

## 2014-10-23 MED ORDER — LORAZEPAM 2 MG/ML IJ SOLN: 1.0000 mg | Freq: Four times a day (QID) | INTRAMUSCULAR | Status: DC | PRN

## 2014-10-23 MED ORDER — MORPHINE SULFATE 2 MG/ML IJ SOLN: 1.0000 mg | INTRAMUSCULAR | Status: DC | PRN

## 2014-10-23 NOTE — Progress Notes (Signed)
Patient is not a good candidate for CPAP therapy due to inability to remove mask in case of emergency.  Discussed with RN.

## 2014-10-23 NOTE — Progress Notes (Signed)
Patient's daughter came into her room,spoke this nurse about the goal of care for this patient and telling this nurse that they are ready for the palliative meeting and requested that this should be done ten in the morning tomorrow.Called M.D.on called about this,M.D., said the she going to call the palliative team and will informed patient's daughter.

## 2014-10-23 NOTE — Progress Notes (Signed)
[facility] PROGRESS NOTE  JAMES LAFALCE WGN:[id number] DOB: Sep 26, 1923 DOA: 10/23/2014 PCP: Viviana Simpler, MD Brief narrative 81 year old female with history of hypertension, hypothyroidism, fibromyalgia, morbid obesity, GERD, diastolic dysfunction chronic lymphedema chronic pain syndrome presented with left heel pain off almost 4 weeks duration. She was discharged from the ED with oxycodone and gabapentin as she had difficulty ambulating and was instructed to increase dose of gabapentin as needed. However patient had persistent pain and return to the ED. Patient in the ED was found to be in acute on chronic respiratory failure with hypoxia. His chest x-ray showing mild patchy left lower lobe opacity. Patient admitted to hospitalist service but however became bradycardic and hypotensive overnight. Patient was placed on BiPAP and transferred to stepdown unit. Patient has persistent hypercapnic respiratory failure with poor clinical improvement. Palliative care consulted for goals of care. Family understands patient's poor prognosis and agrees with residential hospice.  Assessment/Plan: Acute on chronic hypercapnic respiratory failure   combination of increased narcotic use, diastolic CHF and? Pneumonia -requirng BiPAP for persistent hypercapnia with encephaloapthy -Completed empiric Zosyn for pneumonia . Marland Kitchen Recent venous Doppler negative for DVT. -Persistent hypercapnic respiratory failure without much improvement on BiPAP. Patient has poor prognosis. Discussed with daughter who agrees with comfort measures and wishes for residential hospice.  Acute encephalopathy Secondary to hypercapnic respiratory failure.  ? Left plantar fasciitis Has underlying chronic lymphedema Discontinued long-acting narcotics including fentanyl patch.  Tramadol when necessary for pain. Will add when necessary morphine for comfort  Diarrhea on 6/17  stool for cdiff ngative.  Prn imodium  Bradycardia Patient  placed back on low-dose Coreg and tolerating well. Appreciate cardiology evaluation.   Essential hypertension Patient on multiple blood pressure medications including Tekturna, Coreg, hydralazine, lisinopril and Demadex. All medications were held due to hypotension and bradycardia on admission.   Acute kidney injury Possibly secondary to ?UTI. Now resolved.holding ACEi/ tekturna due to AKI and hyperkalemia.  Chronic diastolic CHF When necessary IV Lasix. Continue low-dose Coreg. Held other blood pressure medications.  Anxiety and confusion Add when necessary Ativan for comfort.  Diet: Regular with thin liquids ( seen by swallow nurse)  DVT prophylaxis: Subcutaneous Lovenox   Code Status: DO NOT RESUSCITATE Family Communication: spoke with daughter on the phone  Disposition Plan:  Progressive decline in symptoms . transfer to medical floor. Will make her comfort care.  Consultants:  Cardiology  Procedures:  None  Antibiotics:  IV Zosyn since 6/12--6/16  HPI/Subjective: Recent seen and examined. Appears sleepy and some confusion Objective: Filed Vitals:   10/23/14 0838  BP: 156/64  Pulse: 77  Temp: 97.8 F (36.6 C)  Resp: 25    Intake/Output Summary (Last 24 hours) at 10/23/14 7846 Last data filed at 10/22/14 2213  Gross per 24 hour  Intake    280 ml  Output      0 ml  Net    280 ml   Filed Weights   10/21/14 1121 10/22/14 0500 10/23/14 0500  Weight: 124.7 kg (274 lb 14.6 oz) 124 kg (273 lb 5.9 oz) 124 kg (273 lb 5.9 oz)    Exam:   General: Elderly obese female, sleepy  HEENT:  moist oral mucosa, supple neck,  Chest: diminished breath sounds due to body habitus  Cardiovascular: Normal S1 and S2, no murmurs rub or gallop  GI: Soft, nondistended, nontender,   Musculoskeletal: Warm,  chronic lymphedema   CNS: awake and answering a few questions, flapping tremors persist  Data Reviewed: Basic Metabolic Panel:  Recent Labs Lab  10/17/14 0223 10/17/14 1230 10/18/14 0220 10/19/14 0530 10/20/14 0545  NA 140  --  139 143 143  K 5.5* 5.1 5.1 4.0 4.0  CL 106  --  104 102 100*  CO2 30  --  26 33* 36*  GLUCOSE 105*  --  94 98 97  BUN 25*  --  23* 16 16  CREATININE 1.11*  --  1.06* 0.88 0.83  CALCIUM 9.9  --  10.1 10.2 10.2   Liver Function Tests:  Recent Labs Lab 10/17/14 0223 10/18/14 0220 10/19/14 0530  AST 15 21 15   ALT 14 14 14   ALKPHOS 41 48 48  BILITOT 0.5 1.0 1.2  PROT 6.1* 6.4* 6.4*  ALBUMIN 2.9* 2.9* 2.7*   No results for input(s): LIPASE, AMYLASE in the last 168 hours.  Recent Labs Lab 10/21/14 1041  AMMONIA 22   CBC:  Recent Labs Lab 10/19/14 0530  WBC 8.4  HGB 11.0*  HCT 35.8*  MCV 93.5  PLT 226   Cardiac Enzymes: No results for input(s): CKTOTAL, CKMB, CKMBINDEX, TROPONINI in the last 168 hours. BNP (last 3 results)  Recent Labs  10/16/2014 1958 10/17/14 1232 10/18/14 0220  BNP 212.6* 178.9* 217.8*    ProBNP (last 3 results)  Recent Labs  04/13/14 1556  PROBNP 470.9*    CBG: No results for input(s): GLUCAP in the last 168 hours.  Recent Results (from the past 240 hour(s))  MRSA PCR Screening     Status: None   Collection Time: 10/15/14  3:00 AM  Result Value Ref Range Status   MRSA by PCR NEGATIVE NEGATIVE Final    Comment:        The GeneXpert MRSA Assay (FDA approved for NASAL specimens only), is one component of a comprehensive MRSA colonization surveillance program. It is not intended to diagnose MRSA infection nor to guide or monitor treatment for MRSA infections.   Culture, blood (routine x 2)     Status: None   Collection Time: 10/15/14 11:25 AM  Result Value Ref Range Status   Specimen Description BLOOD RIGHT ANTECUBITAL  Final   Special Requests BOTTLES DRAWN AEROBIC ONLY 10CC  Final   Culture   Final    NO GROWTH 5 DAYS Performed at [facility]    Report Status 10/21/2014 FINAL  Final  Culture, blood (routine x 2)      Status: None   Collection Time: 10/15/14 11:33 AM  Result Value Ref Range Status   Specimen Description BLOOD LEFT ANTECUBITAL  Final   Special Requests BOTTLES DRAWN AEROBIC ONLY 5CC  Final   Culture   Final    NO GROWTH 5 DAYS Performed at [facility]    Report Status 10/21/2014 FINAL  Final  Urine culture     Status: None   Collection Time: 10/17/14  3:06 AM  Result Value Ref Range Status   Specimen Description URINE, RANDOM  Final   Special Requests Normal  Final   Colony Count   Final    25,000 COLONIES/ML Performed at [facility]    Culture   Final    Multiple bacterial morphotypes present, none predominant. Suggest appropriate recollection if clinically indicated. Performed at [facility]    Report Status 10/18/2014 FINAL  Final  Clostridium Difficile by PCR (not at [facility])     Status: None   Collection Time: 10/20/14 11:50 PM  Result Value Ref Range Status   C difficile by pcr NEGATIVE  NEGATIVE Final     Studies: No results found.  Scheduled Meds: . antiseptic oral rinse  7 mL Mouth Rinse q12n4p  . aspirin EC  81 mg Oral Daily  . carvedilol  12.5 mg Oral BID WC  . chlorhexidine  15 mL Mouth Rinse BID  . enoxaparin (LOVENOX) injection  65 mg Subcutaneous Q24H  . nystatin  5 mL Oral TID AC & HS  . nystatin   Topical BID  . piperacillin-tazobactam (ZOSYN)  IV  3.375 g Intravenous 3 times per day  . senna-docusate  2 tablet Oral QHS  . sodium chloride  3 mL Intravenous Q12H  . torsemide  20 mg Oral Daily  . triamcinolone  2 spray Nasal Daily   Continuous Infusions:     Time spent: 25 minutes    Kordae Buonocore, [place]  [facility] Pager [phone]. If 7PM-7AM, please contact night-coverage at [email or web address], password [facility] 10/23/2014, 9:04 AM  LOS: 9 days

## 2014-11-04 NOTE — Progress Notes (Signed)
Nurse tech called RN to patient's room during rounding. Upon RN's arrival to the room patient was laying in bed motionless with no visible chest movement from respiration. No breath or heart sounds upon auscultation. No pulses. Patient with fixed pupils that are non-reactive to light. Fredrich Romans RN called in to call death together at this time. Will notify MD and family.

## 2014-11-04 NOTE — Telephone Encounter (Signed)
She died early this morning I had a chance to talk to her daughter Kayla [name] to express my condolences

## 2014-11-04 NOTE — Discharge Summary (Signed)
Physician Discharge Summary  Kayla [name] YSA:[id number] DOB: 1924-04-15 DOA: 11/03/2014  PCP: Viviana Simpler, MD  Admit date: 10/11/2014 Discharge date: 11/12/14  Time spent: 20 minutes  Patient expired on November 12, 2014    Discharge Diagnoses:  Principal Problem:   Acute respiratory failure with hypoxia and hypercarbia   Active Problems:   Hypothyroidism   Essential hypertension, benign   Chronic venous insufficiency   Chronic diastolic heart failure   Lymphedema of leg   Pulmonary hypertension   Plantar fasciitis of left foot   Renal insufficiency   Bradycardia   Shortness of breath   CAP (community acquired pneumonia)   Palliative care encounter     Filed Weights   10/23/14 0500 10/23/14 1026 10/23/14 2116  Weight: 124 kg (273 lb 5.9 oz) 123.7 kg (272 lb 11.3 oz) 122.8 kg (270 lb 11.6 oz)    History of present illness:  81 year old female with history of hypertension, hypothyroidism, fibromyalgia, morbid obesity, GERD, diastolic dysfunction chronic lymphedema chronic pain syndrome presented with left heel pain off almost 4 weeks duration. She was discharged from the ED with oxycodone and gabapentin as she had difficulty ambulating and was instructed to increase dose of gabapentin as needed. However patient had persistent pain and return to the ED. Patient in the ED was found to be in acute on chronic respiratory failure with hypoxia. His chest x-ray showing mild patchy left lower lobe opacity. Patient admitted to hospitalist service but however became bradycardic and hypotensive overnight. Patient was placed on BiPAP and transferred to stepdown unit. Patient has persistent hypercapnic respiratory failure with poor clinical improvement. Palliative care consulted for goals of care. Family understood  patient's poor prognosis and agreed  With possible  residential hospice.  Hospital Course:  Acute on chronic hypercapnic respiratory failure  combination of increased  narcotic use, diastolic CHF and? Pneumonia. Also possibly had OSA. -pt required BiPAP persistently for hypercapnia with encephaloapthy -Completed empiric Zosyn for pneumonia . Marland Kitchen Recent venous Doppler negative for DVT. -Persistent hypercapnic respiratory failure without much improvement on BiPAP. Given poor prognosis discussed with daughter who agreed with comfort measures and wishes for residential hospice. Patient was transferred to medical floor with goal for comfort measures on 6/19 and plan on discharging her to residential hospice on 12-Nov-2022. However patient was pronounced dead on the morning of  11-12-22. Daughter called and expressed my condolences. She responded that she and family were relieved  in a way  patient did not have to suffer at all.   Acute encephalopathy Secondary to hypercapnic respiratory failure.  ? Left plantar fasciitis Has underlying chronic lymphedema Discontinued long-acting narcotics including fentanyl patch.  Tramadol when necessary for pain. Will add when necessary morphine for comfort   Bradycardia Patient placed back on low-dose Coreg and tolerated well   Essential hypertension Patient on multiple blood pressure medications which were held due to hypotension and bradycardia   Acute kidney injury Possibly secondary to ?UTI.   Chronic diastolic CHF   Anxiety and confusion     Family Communication: spoke with daughter on the phone    Consultants:  Cardiology  Procedures:  None  Antibiotics:  IV Zosyn since 6/12--6/16  Discharge Exam: Filed Vitals:   10/23/14 2116  BP: 151/70  Pulse: 60  Temp:   Resp: 18    Discharge Instructions      Allergies  Allergen Reactions  . Amoxicillin-Pot Clavulanate     unknown  . Augmentin [Amoxicillin-Pot Clavulanate]   . Cefuroxime Axetil  unknown  . Cephalexin     Felt uncomfortable  . Levaquin [Levofloxacin In D5w]     unknown  . Sulfonamide Derivatives     unknown      The  results of significant diagnostics from this hospitalization (including imaging, microbiology, ancillary and laboratory) are listed below for reference.    Significant Diagnostic Studies: Dg Chest 2 View  10/18/2014   CLINICAL DATA:  Bilateral lower extremity pain and swelling  EXAM: CHEST  2 VIEW  COMPARISON:  04/13/2014  FINDINGS: Mild right perihilar scarring, chronic. Mild patchy left lower lobe opacity, atelectasis versus pneumonia. No frank interstitial edema. No pleural effusion or pneumothorax.  Cardiomegaly.  Degenerative changes of the visualized thoracolumbar spine. Stable moderate compression fracture deformity at T10.  IMPRESSION: Mild patchy left lower lobe opacity, atelectasis versus pneumonia.  No frank interstitial edema.   Electronically Signed   By: Julian Hy M.D.   On: 10/17/2014 20:36   Dg Pelvis 1-2 Views  10/12/2014   CLINICAL DATA:  Bilateral lower extremity pain  EXAM: PELVIS - 1-2 VIEW  COMPARISON:  None.  FINDINGS: No fracture or dislocation is seen.  Bilateral hip joint spaces are mildly narrowed but symmetric.  Visualized bony pelvis appears intact.  IMPRESSION: Negative.   Electronically Signed   By: Julian Hy M.D.   On: 10/09/2014 20:34   Dg Chest Port 1 View  10/17/2014   CLINICAL DATA:  Shortness of breath for 1 day.  EXAM: PORTABLE CHEST - 1 VIEW  COMPARISON:  Single view of the chest 10/15/2014. PA and lateral chest 10/26/2014.  FINDINGS: Lung volumes are low. There is cardiomegaly and mild interstitial edema. Patchy airspace disease in the left mid and lower lung zones does not appear notably change compared to the most recent exam.  IMPRESSION: Cardiomegaly and mild interstitial edema.  No marked change in patchy left mid and lower lung zone airspace disease which could be due to atelectasis or infection.   Electronically Signed   By: Inge Rise M.D.   On: 10/17/2014 19:50   Dg Chest Port 1 View  10/15/2014   CLINICAL DATA:  Shortness of breath.   EXAM: PORTABLE CHEST - 1 VIEW  COMPARISON:  10/08/2014  FINDINGS: Cardiac silhouette remains mildly enlarged. Thoracic aortic calcification is noted. Lungs are mildly hypoinflated with mild elevation of the right hemidiaphragm. Mild chronic coarsening of the interstitial markings is similar to the prior study. There is persistent patchy retrocardiac left lower lobe opacity which is similar to the prior study. More laterally in the left midlung is new opacity which is largely linear in configuration. No overt pulmonary edema, sizable pleural effusion, or pneumothorax is identified. No acute osseous abnormality is seen.  IMPRESSION: Patchy left basilar opacities, mildly increased from prior and favored to represent atelectasis although infection is not excluded.   Electronically Signed   By: Logan Bores   On: 10/15/2014 10:35   Dg Foot Complete Left  10/07/2014   CLINICAL DATA:  Left foot pain.  Lymph edema of the leg.  EXAM: LEFT FOOT - COMPLETE 3+ VIEW  COMPARISON:  None.  FINDINGS: Moderate osteopenia is present. There is fusion across the first, second, third, and fourth TMT joints. Inter Phalen she will joints are fused in the third and fourth digits. No acute fracture is present. Extensive soft tissue swelling is likely related to lymphedema. Vascular calcifications are noted as well.  IMPRESSION: 1. Extensive soft tissue swelling is likely related to lymphedema. No discrete  mass is present. 2. Atherosclerotic changes typical of diabetes. 3. Marked osteopenia without acute abnormality of the bones.   Electronically Signed   By: San Morelle M.D.   On: 10/07/2014 13:07    Microbiology: Recent Results (from the past 240 hour(s))  MRSA PCR Screening     Status: None   Collection Time: 10/15/14  3:00 AM  Result Value Ref Range Status   MRSA by PCR NEGATIVE NEGATIVE Final    Comment:        The GeneXpert MRSA Assay (FDA approved for NASAL specimens only), is one component of a comprehensive  MRSA colonization surveillance program. It is not intended to diagnose MRSA infection nor to guide or monitor treatment for MRSA infections.   Culture, blood (routine x 2)     Status: None   Collection Time: 10/15/14 11:25 AM  Result Value Ref Range Status   Specimen Description BLOOD RIGHT ANTECUBITAL  Final   Special Requests BOTTLES DRAWN AEROBIC ONLY 10CC  Final   Culture   Final    NO GROWTH 5 DAYS Performed at [facility]    Report Status 10/21/2014 FINAL  Final  Culture, blood (routine x 2)     Status: None   Collection Time: 10/15/14 11:33 AM  Result Value Ref Range Status   Specimen Description BLOOD LEFT ANTECUBITAL  Final   Special Requests BOTTLES DRAWN AEROBIC ONLY 5CC  Final   Culture   Final    NO GROWTH 5 DAYS Performed at [facility]    Report Status 10/21/2014 FINAL  Final  Urine culture     Status: None   Collection Time: 10/17/14  3:06 AM  Result Value Ref Range Status   Specimen Description URINE, RANDOM  Final   Special Requests Normal  Final   Colony Count   Final    25,000 COLONIES/ML Performed at [facility]    Culture   Final    Multiple bacterial morphotypes present, none predominant. Suggest appropriate recollection if clinically indicated. Performed at [facility]    Report Status 10/18/2014 FINAL  Final  Clostridium Difficile by PCR (not at [facility])     Status: None   Collection Time: 10/20/14 11:50 PM  Result Value Ref Range Status   C difficile by pcr NEGATIVE NEGATIVE Final     Labs: Basic Metabolic Panel:  Recent Labs Lab 10/18/14 0220 10/19/14 0530 10/20/14 0545  NA 139 143 143  K 5.1 4.0 4.0  CL 104 102 100*  CO2 26 33* 36*  GLUCOSE 94 98 97  BUN 23* 16 16  CREATININE 1.06* 0.88 0.83  CALCIUM 10.1 10.2 10.2   Liver Function Tests:  Recent Labs Lab 10/18/14 0220 10/19/14 0530  AST 21 15  ALT 14 14  ALKPHOS 48 48  BILITOT 1.0 1.2  PROT 6.4* 6.4*  ALBUMIN 2.9* 2.7*   No  results for input(s): LIPASE, AMYLASE in the last 168 hours.  Recent Labs Lab 10/21/14 1041  AMMONIA 22   CBC:  Recent Labs Lab 10/19/14 0530  WBC 8.4  HGB 11.0*  HCT 35.8*  MCV 93.5  PLT 226   Cardiac Enzymes: No results for input(s): CKTOTAL, CKMB, CKMBINDEX, TROPONINI in the last 168 hours. BNP: BNP (last 3 results)  Recent Labs  10/13/2014 1958 10/17/14 1232 10/18/14 0220  BNP 212.6* 178.9* 217.8*    ProBNP (last 3 results)  Recent Labs  04/13/14 1556  PROBNP 470.9*    CBG: No results for  input(s): GLUCAP in the last 168 hours.     SignedLouellen Molder  [facility] Nov 12, 2014, 6:22 PM

## 2014-11-04 DEATH — deceased

## 2015-08-03 NOTE — Telephone Encounter (Signed)
RX never picked up  RX destroyed
